# Patient Record
Sex: Male | Born: 1935 | Race: White | Hispanic: No | Marital: Married | Smoking: Former smoker
Health system: Southern US, Community
[De-identification: ages and names within clinical notes are randomized; demographics above are authoritative.]

## PROBLEM LIST (undated history)

## (undated) DIAGNOSIS — N400 Enlarged prostate without lower urinary tract symptoms: Secondary | ICD-10-CM

## (undated) DIAGNOSIS — I493 Ventricular premature depolarization: Secondary | ICD-10-CM

## (undated) DIAGNOSIS — I82409 Acute embolism and thrombosis of unspecified deep veins of unspecified lower extremity: Secondary | ICD-10-CM

## (undated) DIAGNOSIS — I10 Essential (primary) hypertension: Secondary | ICD-10-CM

## (undated) DIAGNOSIS — H269 Unspecified cataract: Secondary | ICD-10-CM

## (undated) DIAGNOSIS — I4891 Unspecified atrial fibrillation: Secondary | ICD-10-CM

## (undated) DIAGNOSIS — N183 Chronic kidney disease, stage 3 unspecified: Secondary | ICD-10-CM

## (undated) DIAGNOSIS — C419 Malignant neoplasm of bone and articular cartilage, unspecified: Secondary | ICD-10-CM

## (undated) DIAGNOSIS — M19049 Primary osteoarthritis, unspecified hand: Secondary | ICD-10-CM

## (undated) DIAGNOSIS — Z95828 Presence of other vascular implants and grafts: Secondary | ICD-10-CM

## (undated) DIAGNOSIS — I2699 Other pulmonary embolism without acute cor pulmonale: Secondary | ICD-10-CM

## (undated) DIAGNOSIS — I2510 Atherosclerotic heart disease of native coronary artery without angina pectoris: Secondary | ICD-10-CM

## (undated) DIAGNOSIS — E785 Hyperlipidemia, unspecified: Secondary | ICD-10-CM

## (undated) DIAGNOSIS — R42 Dizziness and giddiness: Secondary | ICD-10-CM

## (undated) HISTORY — DX: Presence of other vascular implants and grafts: Z95.828

## (undated) HISTORY — DX: Acute embolism and thrombosis of unspecified deep veins of unspecified lower extremity: I82.409

## (undated) HISTORY — DX: Hyperlipidemia, unspecified: E78.5

## (undated) HISTORY — DX: Atherosclerotic heart disease of native coronary artery without angina pectoris: I25.10

## (undated) HISTORY — DX: Malignant neoplasm of bone and articular cartilage, unspecified: C41.9

## (undated) HISTORY — DX: Unspecified cataract: H26.9

## (undated) HISTORY — PX: HIP SURGERY: SHX245

## (undated) HISTORY — DX: Other pulmonary embolism without acute cor pulmonale: I26.99

## (undated) HISTORY — PX: OTHER SURGICAL HISTORY: SHX169

## (undated) HISTORY — DX: Unspecified atrial fibrillation: I48.91

## (undated) HISTORY — DX: Primary osteoarthritis, unspecified hand: M19.049

## (undated) HISTORY — DX: Essential (primary) hypertension: I10

## (undated) HISTORY — DX: Chronic kidney disease, stage 3 unspecified: N18.30

## (undated) HISTORY — DX: Chronic kidney disease, stage 3 (moderate): N18.3

## (undated) HISTORY — DX: Benign prostatic hyperplasia without lower urinary tract symptoms: N40.0

## (undated) HISTORY — DX: Ventricular premature depolarization: I49.3

## (undated) HISTORY — DX: Dizziness and giddiness: R42

---

## 1970-10-11 HISTORY — PX: STOMACH SURGERY: SHX791

## 1983-10-12 HISTORY — PX: ROTATOR CUFF REPAIR: SHX139

## 1999-10-12 DIAGNOSIS — C419 Malignant neoplasm of bone and articular cartilage, unspecified: Secondary | ICD-10-CM

## 1999-10-12 HISTORY — DX: Malignant neoplasm of bone and articular cartilage, unspecified: C41.9

## 2002-10-22 HISTORY — PX: CARDIAC CATHETERIZATION: SHX172

## 2010-06-08 ENCOUNTER — Ambulatory Visit (HOSPITAL_COMMUNITY): Admission: RE | Admit: 2010-06-08 | Discharge: 2010-06-08 | Payer: Self-pay | Admitting: Ophthalmology

## 2010-09-14 ENCOUNTER — Ambulatory Visit: Payer: Self-pay | Admitting: Cardiology

## 2011-05-23 ENCOUNTER — Emergency Department (HOSPITAL_COMMUNITY): Payer: Medicare HMO

## 2011-05-23 ENCOUNTER — Inpatient Hospital Stay (HOSPITAL_COMMUNITY)
Admission: EM | Admit: 2011-05-23 | Discharge: 2011-05-25 | DRG: 176 | Disposition: A | Discharge status: Home / Self Care | Payer: Medicare HMO | Attending: Internal Medicine | Admitting: Internal Medicine

## 2011-05-23 DIAGNOSIS — I2510 Atherosclerotic heart disease of native coronary artery without angina pectoris: Secondary | ICD-10-CM | POA: Diagnosis present

## 2011-05-23 DIAGNOSIS — I10 Essential (primary) hypertension: Secondary | ICD-10-CM | POA: Diagnosis present

## 2011-05-23 DIAGNOSIS — I959 Hypotension, unspecified: Secondary | ICD-10-CM | POA: Diagnosis present

## 2011-05-23 DIAGNOSIS — N400 Enlarged prostate without lower urinary tract symptoms: Secondary | ICD-10-CM | POA: Diagnosis present

## 2011-05-23 DIAGNOSIS — I2699 Other pulmonary embolism without acute cor pulmonale: Principal | ICD-10-CM | POA: Diagnosis present

## 2011-05-23 DIAGNOSIS — E785 Hyperlipidemia, unspecified: Secondary | ICD-10-CM | POA: Diagnosis present

## 2011-05-23 DIAGNOSIS — Z7901 Long term (current) use of anticoagulants: Secondary | ICD-10-CM

## 2011-05-23 LAB — URINALYSIS, ROUTINE W REFLEX MICROSCOPIC
Bilirubin Urine: NEGATIVE
Glucose, UA: NEGATIVE mg/dL
Hgb urine dipstick: NEGATIVE
Ketones, ur: 15 mg/dL — AB
Leukocytes, UA: NEGATIVE
Nitrite: NEGATIVE
Protein, ur: NEGATIVE mg/dL
Specific Gravity, Urine: 1.019 (ref 1.005–1.030)
Urobilinogen, UA: 1 mg/dL (ref 0.0–1.0)
pH: 5.5 (ref 5.0–8.0)

## 2011-05-23 LAB — DIFFERENTIAL
Basophils Absolute: 0 K/uL (ref 0.0–0.1)
Basophils Relative: 0 % (ref 0–1)
Eosinophils Absolute: 0.1 10*3/uL (ref 0.0–0.7)
Eosinophils Relative: 4 % (ref 0–5)
Lymphocytes Relative: 40 % (ref 12–46)
Lymphs Abs: 1.3 K/uL (ref 0.7–4.0)
Monocytes Absolute: 0.4 10*3/uL (ref 0.1–1.0)
Monocytes Relative: 12 % (ref 3–12)
Neutro Abs: 1.4 K/uL — ABNORMAL LOW (ref 1.7–7.7)
Neutrophils Relative %: 45 % (ref 43–77)

## 2011-05-23 LAB — CBC
HCT: 34.4 % — ABNORMAL LOW (ref 39.0–52.0)
Hemoglobin: 11.8 g/dL — ABNORMAL LOW (ref 13.0–17.0)
MCH: 32.7 pg (ref 26.0–34.0)
MCHC: 34.3 g/dL (ref 30.0–36.0)
MCV: 95.3 fL (ref 78.0–100.0)
Platelets: 163 10*3/uL (ref 150–400)
RBC: 3.61 MIL/uL — ABNORMAL LOW (ref 4.22–5.81)
RDW: 14.6 % (ref 11.5–15.5)
WBC: 3.2 K/uL — ABNORMAL LOW (ref 4.0–10.5)

## 2011-05-23 LAB — APTT: aPTT: 34 seconds (ref 24–37)

## 2011-05-23 LAB — COMPREHENSIVE METABOLIC PANEL WITH GFR
ALT: 15 U/L (ref 0–53)
Albumin: 3.4 g/dL — ABNORMAL LOW (ref 3.5–5.2)
Alkaline Phosphatase: 66 U/L (ref 39–117)
Calcium: 9 mg/dL (ref 8.4–10.5)
GFR calc Af Amer: 31 mL/min — ABNORMAL LOW (ref >60)
Glucose, Bld: 90 mg/dL (ref 70–99)
Potassium: 5.4 meq/L — ABNORMAL HIGH (ref 3.5–5.1)
Sodium: 139 meq/L (ref 135–145)
Total Protein: 6.8 g/dL (ref 6.0–8.3)

## 2011-05-23 LAB — COMPREHENSIVE METABOLIC PANEL
AST: 21 U/L (ref 0–37)
BUN: 41 mg/dL — ABNORMAL HIGH (ref 6–23)
CO2: 21 mEq/L (ref 19–32)
Chloride: 109 mEq/L (ref 96–112)
Creatinine, Ser: 2.44 mg/dL — ABNORMAL HIGH (ref 0.50–1.35)
GFR calc non Af Amer: 25 mL/min — ABNORMAL LOW (ref >60)
Total Bilirubin: 0.3 mg/dL (ref 0.3–1.2)

## 2011-05-23 LAB — PROTIME-INR
INR: 2.04 — ABNORMAL HIGH (ref 0.00–1.49)
Prothrombin Time: 23.4 s — ABNORMAL HIGH (ref 11.6–15.2)

## 2011-05-23 LAB — POCT I-STAT TROPONIN I
Troponin i, poc: 0 ng/mL (ref 0.00–0.08)
Troponin i, poc: 0 ng/mL (ref 0.00–0.08)

## 2011-05-23 MED ORDER — TECHNETIUM TO 99M ALBUMIN AGGREGATED
6.0000 | Freq: Once | INTRAVENOUS | Status: AC | PRN
  Administered 2011-05-23: 6 via INTRAVENOUS

## 2011-05-23 MED ORDER — XENON XE 133 GAS
10.5000 | GAS_FOR_INHALATION | Freq: Once | RESPIRATORY_TRACT | Status: AC | PRN
  Administered 2011-05-23: 11 via RESPIRATORY_TRACT

## 2011-05-24 DIAGNOSIS — Z86718 Personal history of other venous thrombosis and embolism: Secondary | ICD-10-CM

## 2011-05-24 LAB — COMPREHENSIVE METABOLIC PANEL
BUN: 38 mg/dL — ABNORMAL HIGH (ref 6–23)
CO2: 21 mEq/L (ref 19–32)
Calcium: 8.7 mg/dL (ref 8.4–10.5)
Creatinine, Ser: 1.73 mg/dL — ABNORMAL HIGH (ref 0.50–1.35)
GFR calc Af Amer: 46 mL/min — ABNORMAL LOW (ref >60)
GFR calc non Af Amer: 38 mL/min — ABNORMAL LOW (ref >60)
Glucose, Bld: 89 mg/dL (ref 70–99)

## 2011-05-24 LAB — CBC
HCT: 32 % — ABNORMAL LOW (ref 39.0–52.0)
Hemoglobin: 10.6 g/dL — ABNORMAL LOW (ref 13.0–17.0)
MCH: 31.3 pg (ref 26.0–34.0)
MCV: 94.4 fL (ref 78.0–100.0)
RBC: 3.39 MIL/uL — ABNORMAL LOW (ref 4.22–5.81)

## 2011-05-24 LAB — DIFFERENTIAL
Eosinophils Absolute: 0.1 10*3/uL (ref 0.0–0.7)
Lymphocytes Relative: 47 % — ABNORMAL HIGH (ref 12–46)
Lymphs Abs: 1.5 10*3/uL (ref 0.7–4.0)
Monocytes Relative: 15 % — ABNORMAL HIGH (ref 3–12)
Neutro Abs: 1.1 10*3/uL — ABNORMAL LOW (ref 1.7–7.7)
Neutrophils Relative %: 34 % — ABNORMAL LOW (ref 43–77)

## 2011-05-24 LAB — HEPARIN LEVEL (UNFRACTIONATED): Heparin Unfractionated: 0.7 IU/mL (ref 0.30–0.70)

## 2011-05-24 NOTE — H&P (Signed)
NAMEJSAON, Darrell Mendoza                ACCOUNT NO.:  [id number]  MEDICAL RECORD NO.:  [id number]  LOCATION:                                 FACILITY:  PHYSICIAN:  Zakariah Urwin A. Presten Joost, M.D.   DATE OF BIRTH:  October 22, 1924  DATE OF ADMISSION: DATE OF DISCHARGE:                             HISTORY & PHYSICAL   CHIEF COMPLAINT:  Lightheadedness.  HISTORY OF PRESENT ILLNESS:  Darrell Mendoza is a pleasant 77 year old gentleman who has been doing fairly well.  He has been on chronic Coumadin for history of pulmonary embolism.  He also has a history of coronary artery disease.  He states that over the last week or so, he has felt somewhat more lightheaded.  He did have some travel to [place] in the last week also.  He was seen in [facility] and was told to increase his Coumadin dose.  His INR on May 18, 2011 was 1.9.  At that time, he was told to take 2.5 mg of Coumadin daily except 5 mg every Monday and Friday.  However, in church today, he had significant lightheaded feeling and broke out in a sweat.  Paramedics were called.  He was found to be slightly hypotensive with a systolic blood pressure of 88, diastolic blood pressure of 50.  He was brought to the emergency room. Emergency room evaluation eventually resulted in a V/Q scan which is high probability for pulmonary embolism.  So, it is possible that he has had recent recurrent pulmonary embolism.  He does give a possible history of having an inferior vena cava filter in place and this could be the reason that he is having this problem.  He denies any fevers, any significant chest pains, any blood from above or below.  PAST MEDICAL HISTORY: 1. Premature ventricular contractions. 2. Hand arthritis. 3. Coronary artery disease status post percutaneous intervention. 4. Chronic Coumadin therapy. 5. Past history of pulmonary embolism. 6. Hyperlipidemia. 7. Hypertension. 8. Osteosarcoma of the right hip, 2001. 9. Stomach surgery in 1972 for  a hernia, right rotator cuff surgery in     1985, lumbar surgery in 2000, right hip surgery in 2001-2002, and     bilateral cataracts.  ALLERGIES:  None.  MEDICATIONS: 1. Terazosin 5 mg p.o. daily at bedtime. 2. Pravastatin 40 mg p.o. daily at evening. 3. Lisinopril 2.5 mg 1 tablet daily. 4. Coumadin 2.5 mg daily except 5 mg every Monday and Friday.  SOCIAL HISTORY:  He is married.  He has 2 living children, but had 3 children in all.  He had high school education.  He is retired from a [employer] manager.  He is a nondrinker, nonsmoker.  No drug abuse.  FAMILY HISTORY:  Father died at age 1 of urosepsis.  Mother died at age 15 of tuberculosis.  One son died of cerebral palsy.  The other 2 children are alive and healthy.  REVIEW OF SYSTEMS:  As per the history of present illness.  PHYSICAL EXAMINATION:  VITAL SIGNS:  Temperature 98.3; initial blood pressure 97/64 and after some IV fluids, it came up to 149/64; pulse is 56, ranging from 56-91; respiratory rate 16; and 97% saturation on room  air. GENERAL:  He is sitting in semi-supine on the stretcher, in no acute distress.  He is alert and oriented x4 and speaks appropriately.  He is oriented to person, place, and time. LUNGS:  Clear to auscultation bilaterally anterolaterally with no wheezes, rales, or rhonchi. HEART:  Regular rate and rhythm with no significant murmur, rub, or gallop. ABDOMEN:  Soft, nontender, and nondistended.  There is no peripheral edema.  He moves extremities x4.  He has 2+ pulses in both ankles.  RADIOLOGY DATA:  V/Q scan shows high probability of pulmonary embolism with multiple wedge shape segmental and subsegmental defects in both lungs without corresponding ventilatory or chest x-ray abnormality. Chest x-ray done today shows bilateral basilar interstitial densities, which may represent interstitial pulmonary edema or more likely fibrotic change.  LABORATORY DATA:  Troponin I of 0.00.  Urinalysis is  negative with the exception of 15 mg/dL of ketones.  CMET shows a potassium of 5.4, BUN of 41, and creatinine 2.44.  Liver tests normal with the exception of an albumin of 3.4.  PTT is 34 and INR is 2.04.  White count 3.2, hemoglobin 11.8, and platelet count 163,000 with a normal differential.  Baseline BUN and creatinine from May 2012 were 30 and 1.5 respectively.  ASSESSMENT/PLAN:  An 77 year old gentleman with apparent recurrent pulmonary embolism.  He gives a history of possibly having an IVC filter in place.  This is unclear from our office record, but we will explore this further.  We will place him on full-dose heparin drip and continue his Coumadin with a goal INR of 3.0-3.5.  We will defer any other evaluation to his primary care physician.  However, we will order bilateral lower extremity venous Dopplers to rule out any acute or subacute DVT.  We will continue his home medications to include terazosin and pravastatin, but with slight elevation in his creatinine, we will hold lisinopril for the time being.  He has a full code status.     Erielle Gawronski A. Waynard Edwards, M.D.     MAP/MEDQ  D:  05/24/2011  T:  05/24/2011  Job:  [id number]  Electronically Signed by Rodrigo Ran M.D. on 05/24/2011 01:07:37 PM

## 2011-05-25 LAB — CBC
MCH: 30.9 pg (ref 26.0–34.0)
Platelets: 128 10*3/uL — ABNORMAL LOW (ref 150–400)
RBC: 3.69 MIL/uL — ABNORMAL LOW (ref 4.22–5.81)
RDW: 14.4 % (ref 11.5–15.5)
WBC: 3.2 10*3/uL — ABNORMAL LOW (ref 4.0–10.5)

## 2011-05-25 LAB — DIFFERENTIAL
Eosinophils Absolute: 0.2 10*3/uL (ref 0.0–0.7)
Lymphs Abs: 1.6 10*3/uL (ref 0.7–4.0)
Monocytes Absolute: 0.4 10*3/uL (ref 0.1–1.0)
Neutrophils Relative %: 31 % — ABNORMAL LOW (ref 43–77)

## 2011-05-25 LAB — COMPREHENSIVE METABOLIC PANEL
ALT: 12 U/L (ref 0–53)
Calcium: 9.2 mg/dL (ref 8.4–10.5)
Creatinine, Ser: 1.57 mg/dL — ABNORMAL HIGH (ref 0.50–1.35)
GFR calc Af Amer: 51 mL/min — ABNORMAL LOW (ref >60)
GFR calc non Af Amer: 42 mL/min — ABNORMAL LOW (ref >60)
Glucose, Bld: 97 mg/dL (ref 70–99)
Sodium: 139 mEq/L (ref 135–145)
Total Protein: 6.5 g/dL (ref 6.0–8.3)

## 2011-05-25 LAB — HEPARIN LEVEL (UNFRACTIONATED): Heparin Unfractionated: 0.7 IU/mL (ref 0.30–0.70)

## 2011-05-26 NOTE — Discharge Summary (Signed)
Darrell Mendoza, CIRELLI NO.:  [id number]  MEDICAL RECORD NO.:  [id number]  LOCATION:  3743                         FACILITY:  [facility]  PHYSICIAN:  Gaspar Garbe, M.D.DATE OF BIRTH:  17-Mar-1925  DATE OF ADMISSION:  05/23/2011 DATE OF DISCHARGE:  05/25/2011                              DISCHARGE SUMMARY   FINAL DIAGNOSES: 1. Pulmonary embolism. 2. Hypotension secondary to above. 3. Benign prostatic hypertrophy. 4. History of chronic lower extremity clots and Greenfield filter. 5. Coumadin anticoagulation for deep vein thrombosis. 6. Hyperlipidemia. 7. History of hypertension.  MEDICATIONS ON DISCHARGE: 1. Calcium over-the-counter 1 tablet daily. 2. Hytrin 2 mg 1 tablet by mouth daily. 3. Multivitamins over-the-counter once daily. 4. Pravastatin 40 mg once daily. 5. Vitamin B12 1 tablet once daily over-the-counter. 6. Vitamin C over-the-counter 1 tablet once daily. 7. Warfarin 5 mg daily.  He is to take half tablet on Thursday. 8. The patient is to discontinue his lisinopril which was previously     dosed at 2.5 mg daily.  IMAGING STUDIES:  The patient underwent a V/Q scan on date of admission which was May 23, 2011, showing high probability pulmonary embolism located with multiple white segments on both lungs without corresponding ventilatory or chest x-ray abnormality.  He had abdominal films done the early morning of May 24, 2011, for verification of his IVC filter which is placed between the levels of L1-L3 and nonobstructive bowel gas pattern was noted.  The patient's initial chest x-ray showed bibasilar interstitial densities representing interstitial pulmonary edema more likely fibrotic changes.  LABORATORY TESTS:  Day of admission, he had a CBC with a white count 3.2, hemoglobin 11.8, hematocrit 34.4, platelet count 163.  Discharge labs are currently pending for some reason and [facility] is delaying these on the morning of May 25, 2011.  His heparin levels have been adequate.  His electrolytes showed a potassium slightly elevated at 5.2 on May 24, 2011, for which he was receiving IV fluids and his final posted INR is 2.29.  PHYSICAL EXAMINATION:  On date of discharge: VITAL SIGNS:  134/67, satting 93% on room air, heart rate 68, respiratory rate 18. GENERAL:  No acute distress. HEENT:  Normocephalic, atraumatic.  PERRLA.  EOMI.  ENT is within normal limits. NECK:  Supple.  No lymphadenopathy, JVD or bruit. HEART:  Regular rate and rhythm.  No murmur, rub or gallop appreciated. LUNGS:  Clear to auscultation bilaterally. ABDOMEN:  Soft, nontender, normoactive bowel sounds. EXTREMITIES:  No clubbing, cyanosis or edema.  HOSPITAL COURSE:  Mr. Brigham was admitted through the emergency room on the evening of May 23, 2011.  He had felt somewhat weak and was found to be hypotensive after going to Urgent Care and was sent over to the emergency room for further evaluation.  This evaluation included a V/Q scan which showed a high probability of clot.  He was started on IV heparin and continued on his Coumadin.  Verification of his IVC filter was noted, this was absent from his office records though it was noted on radiology exam.  As noted above, the patient remained stable for the next 48 hours.  He subsequently had  ultrasound of his lower extremities which did not show any evidence of DVT.  He had no clinical signs of any upper extremity DVT or source and continued to do well during these 48 hours without evidence of hypotension or hypoxia.  The patient is being discharged home with goal of INR towards 3-3.5 versus his prior 2-3. His last office INR was 1.9 as initial INR in the emergency room was 2.0 showing that he was in fact anticoagulated at the time that his pulmonary embolus occurred. The patient continued to do well and will follow up in the office for Coumadin check on Friday which is May 28, 2011,  at 9:10 a.m. with our nurse practitioner, Fannie Knee who will make adjustments with a goal INR of 3.0 to continue with routine followup.     Gaspar Garbe, M.D.     RWT/MEDQ  D:  05/25/2011  T:  05/25/2011  Job:  [id number]  Electronically Signed by Guerry Bruin M.D. on 05/26/2011 07:45:04 PM

## 2011-09-17 ENCOUNTER — Ambulatory Visit: Payer: Medicare HMO | Admitting: Cardiology

## 2011-10-26 ENCOUNTER — Encounter: Payer: Self-pay | Admitting: Cardiology

## 2011-10-26 ENCOUNTER — Ambulatory Visit (INDEPENDENT_AMBULATORY_CARE_PROVIDER_SITE_OTHER): Payer: Medicare Other | Admitting: Cardiology

## 2011-10-26 VITALS — BP 116/66 | HR 56 | Ht 68.0 in | Wt 180.0 lb

## 2011-10-26 DIAGNOSIS — O2230 Deep phlebothrombosis in pregnancy, unspecified trimester: Secondary | ICD-10-CM

## 2011-10-26 DIAGNOSIS — I2510 Atherosclerotic heart disease of native coronary artery without angina pectoris: Secondary | ICD-10-CM

## 2011-10-26 DIAGNOSIS — E785 Hyperlipidemia, unspecified: Secondary | ICD-10-CM

## 2011-10-26 DIAGNOSIS — I2699 Other pulmonary embolism without acute cor pulmonale: Secondary | ICD-10-CM | POA: Insufficient documentation

## 2011-10-26 DIAGNOSIS — I82409 Acute embolism and thrombosis of unspecified deep veins of unspecified lower extremity: Secondary | ICD-10-CM

## 2011-10-26 DIAGNOSIS — I10 Essential (primary) hypertension: Secondary | ICD-10-CM

## 2011-10-26 NOTE — Assessment & Plan Note (Signed)
He is asymptomatic at this point and is on chronic anticoagulation with Coumadin.

## 2011-10-26 NOTE — Progress Notes (Signed)
Darrell Mendoza Date of Birth: [birth year] Medical Record #[id number]  History of Present Illness: Darrell Mendoza is seen for yearly followup. He has a history of coronary disease and underwent stenting of the mid right coronary in 2004 with a 3.0 x 23 mm Cypher stent. He reports he has done very well from a cardiac standpoint over the past year. His last stress test was in December of 2010 showed a basal to mid inferior wall scar without ischemia and ejection fraction 61%. His medical history is significant for a DVT with pulmonary emboli in August of this year. This was felt to be related to prolonged travel. He has been on Coumadin therapy since then. During that hospital stay he was noted to have frequent PVCs.  Current Outpatient Prescriptions on File Prior to Visit  Medication Sig Dispense Refill  . vitamin D, CHOLECALCIFEROL, 400 UNITS tablet Take 400 Units by mouth daily.      Marland Kitchen warfarin (COUMADIN) 5 MG tablet Take 5 mg by mouth daily.        No Known Allergies  Past Medical History  Diagnosis Date  . Pulmonary embolism     05/2011  . Hypotension   . Benign prostatic hypertrophy   . Greenfield filter in place     History of chronic lower extremity clots  . Deep vein thrombosis     Coumadin anticoagulation for deep vein thrombosis  . Hyperlipidemia   . Hypertension      History of hypertension  . Lightheadedness   . Premature ventricular contractions   . Hand arthritis   . Coronary artery disease     s/p 3.0x23 cypher mid rca 2004  . 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 Bilateral cataracts     Past Surgical History  Procedure Date  . Stomach surgery 1972    for a hernia  . Rotator cuff repair 1985    right rotator cuff surgery  . Hip surgery 2001-2001    right hip surgery  . Cardiac catheterization 10/22/2002    left heart cath    History  Smoking status  . Former Smoker  Smokeless tobacco  . Former Neurosurgeon  . Quit date: 10/20/1983    History  Alcohol Use  No    Family History  Problem Relation Age of Onset  . Tuberculosis Mother     Review of Systems: The review of systems is positive for some chronic renal insufficiency.  All other systems were reviewed and are negative.  Physical Exam: BP 116/66  Pulse 56  Ht 5\' 8"  (1.727 m)  Wt 180 lb (81.647 kg)  BMI 27.37 kg/m2 He is a pleasant white male in no acute distress.The patient is alert and oriented x 3.  The mood and affect are normal.  The skin is warm and dry.  Color is normal.  The HEENT exam reveals that the sclera are nonicteric.  The mucous membranes are moist.  The carotids are 2+ without bruits.  There is no thyromegaly.  There is no JVD.  The lungs are clear.  The chest wall is non tender.  The heart exam reveals a regular rate with occasional extrasystole and a normal S1 and S2.  There are no murmurs, gallops, or rubs.  The PMI is not displaced.   Abdominal exam reveals good bowel sounds.  There is no guarding or rebound.  There is no hepatosplenomegaly or tenderness.  There are no masses.  Exam of the legs reveal no  clubbing, cyanosis, or edema.  The legs are without rashes.  The distal pulses are intact.  Cranial nerves II - XII are intact.  Motor and sensory functions are intact.  The gait is normal.  LABORATORY DATA:   Assessment / Plan:

## 2011-10-26 NOTE — Patient Instructions (Signed)
I would recommend you take a baby ASA 81 mg daily in addition to your coumadin.  Continue your other therapy.  I will see you again in 1 year.

## 2011-10-26 NOTE — Assessment & Plan Note (Addendum)
He has had remote drug-eluting stent placement in the right coronary. Although he is on Coumadin for his pulmonary embolus I think he also needs to stay on aspirin 81 mg daily because of his coronary disease. I will followup again in one year.

## 2011-10-26 NOTE — Assessment & Plan Note (Signed)
Blood pressure is controlled on exam today.

## 2012-11-13 ENCOUNTER — Encounter: Payer: Self-pay | Admitting: Cardiology

## 2012-11-13 ENCOUNTER — Ambulatory Visit (INDEPENDENT_AMBULATORY_CARE_PROVIDER_SITE_OTHER): Payer: Medicare Other | Admitting: Cardiology

## 2012-11-13 VITALS — BP 128/66 | HR 76 | Ht 68.0 in | Wt 179.0 lb

## 2012-11-13 DIAGNOSIS — I2699 Other pulmonary embolism without acute cor pulmonale: Secondary | ICD-10-CM

## 2012-11-13 DIAGNOSIS — I2510 Atherosclerotic heart disease of native coronary artery without angina pectoris: Secondary | ICD-10-CM

## 2012-11-13 DIAGNOSIS — I82409 Acute embolism and thrombosis of unspecified deep veins of unspecified lower extremity: Secondary | ICD-10-CM

## 2012-11-13 DIAGNOSIS — I10 Essential (primary) hypertension: Secondary | ICD-10-CM

## 2012-11-13 DIAGNOSIS — E785 Hyperlipidemia, unspecified: Secondary | ICD-10-CM

## 2012-11-13 NOTE — Progress Notes (Signed)
[name] Date of Birth: 1925-06-13 Medical Record #[id number]  History of Present Illness: [name] is seen for yearly followup. He has a history of coronary disease and underwent stenting of the mid right coronary in 2004 with a 3.0 x 23 mm Cypher stent.  His last stress test was in December of 2010 showed a basal to mid inferior wall scar without ischemia and ejection fraction 61%. His medical history is significant for a DVT with pulmonary emboli. He is on chronic Coumadin therapy. He states he has done very well this year. The only pain he has had is a sharp pain beneath his left breast when he is sitting. This is positionally related and is worse when he slumps to his left side. He denies any exertional chest pain. His walking is limited because of chronic hip pain.  Current Outpatient Prescriptions on File Prior to Visit  Medication Sig Dispense Refill  . calcium-vitamin D (OSCAL WITH D) 250-125 MG-UNIT per tablet Take 1 tablet by mouth daily.      . fish oil-omega-3 fatty acids 1000 MG capsule Take 2 g by mouth daily.      . Multiple Vitamin (MULTIVITAMIN) tablet Take 1 tablet by mouth daily.      . pravastatin (PRAVACHOL) 40 MG tablet Take 40 mg by mouth daily.      . Tamsulosin HCl (FLOMAX) 0.4 MG CAPS Take 0.4 mg by mouth daily.      . vitamin D, CHOLECALCIFEROL, 400 UNITS tablet Take 400 Units by mouth daily.      Marland Kitchen warfarin (COUMADIN) 5 MG tablet Take 5 mg by mouth daily.        No Known Allergies  Past Medical History  Diagnosis Date  . Pulmonary embolism     05/2011  . Hypotension   . Benign prostatic hypertrophy   . Greenfield filter in place     History of chronic lower extremity clots  . Deep vein thrombosis     Coumadin anticoagulation for deep vein thrombosis  . Hyperlipidemia   . Hypertension      History of hypertension  . Lightheadedness   . Premature ventricular contractions   . Hand arthritis   . Coronary artery disease     s/p 3.0x23 cypher mid  rca 2004  . 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 Bilateral cataracts     Past Surgical History  Procedure Date  . Stomach surgery 1972    for a hernia  . Rotator cuff repair 1985    right rotator cuff surgery  . Hip surgery 2001-2001    right hip surgery  . Cardiac catheterization 10/22/2002    left heart cath    History  Smoking status  . Former Smoker  Smokeless tobacco  . Former Neurosurgeon  . Quit date: 10/20/1983    History  Alcohol Use No    Family History  Problem Relation Age of Onset  . Tuberculosis Mother     Review of Systems: As noted per history of present illness.  All other systems were reviewed and are negative.  Physical Exam: BP 128/66  Pulse 76  Ht 5\' 8"  (1.727 m)  Wt 179 lb (81.194 kg)  BMI 27.22 kg/m2 He is a pleasant white male in no acute distress.The patient is alert and oriented x 3.  The mood and affect are normal.  The skin is warm and dry.  Color is normal.  The HEENT exam is normal. The carotids  are 2+ without bruits.  There is no thyromegaly.  There is no JVD.  The lungs are clear.  The chest wall is non tender.  The heart exam reveals a regular rate with occasional extrasystole and a normal S1 and S2.  There are no murmurs, gallops, or rubs.  The PMI is not displaced.   Abdominal exam reveals good bowel sounds.  There is no guarding or rebound.  There is no hepatosplenomegaly or tenderness.  There are no masses.  Exam of the legs reveal no clubbing, cyanosis, or edema.  The legs are without rashes.  The distal pulses are intact.  Cranial nerves II - XII are intact.  Motor and sensory functions are intact.  The gait is normal.  LABORATORY DATA: ECG today demonstrates normal sinus rhythm with frequent PACs and PVCs. There are nonspecific ST changes. This is unchanged from one year prior.  Assessment / Plan: 1. Coronary disease status post stenting of the right coronary in 2004 with a Cypher stent. He is asymptomatic. I will recommend  continuing on his current medication.  2. History of DVT with pulmonary emboli. On chronic anticoagulation. Next  3. Hypertension. Blood pressure is well controlled.  4. Atypical chest pain. His symptoms are musculoskeletal in origin.

## 2012-11-13 NOTE — Patient Instructions (Signed)
Continue your current therapy  I will see you again in one year.   

## 2013-02-22 ENCOUNTER — Encounter: Payer: Self-pay | Admitting: Cardiology

## 2013-09-10 ENCOUNTER — Telehealth: Payer: Self-pay | Admitting: *Deleted

## 2013-09-10 NOTE — Telephone Encounter (Signed)
Pt's caregiver states pt has an appt 09/12/2013 at 130pm, but has extremely red, swollen toe.  Could he get an earlier appt?  I contacted pt's wife and instructed to perform warm epsom salt water soaks and apply antibacterial ointment to toe until the appt, and transferred to scheduler.

## 2013-09-11 ENCOUNTER — Ambulatory Visit (INDEPENDENT_AMBULATORY_CARE_PROVIDER_SITE_OTHER): Payer: Medicare Other

## 2013-09-11 VITALS — BP 119/79 | HR 68 | Resp 20 | Ht 69.0 in | Wt 169.0 lb

## 2013-09-11 DIAGNOSIS — L600 Ingrowing nail: Secondary | ICD-10-CM

## 2013-09-11 DIAGNOSIS — M79609 Pain in unspecified limb: Secondary | ICD-10-CM

## 2013-09-11 DIAGNOSIS — L03039 Cellulitis of unspecified toe: Secondary | ICD-10-CM

## 2013-09-11 DIAGNOSIS — B351 Tinea unguium: Secondary | ICD-10-CM

## 2013-09-11 MED ORDER — CEPHALEXIN 500 MG PO CAPS: 500.0000 mg | ORAL_CAPSULE | Freq: Three times a day (TID) | ORAL | Status: DC

## 2013-09-11 NOTE — Progress Notes (Signed)
   Subjective:    Patient ID: Darrell Mendoza, male    DOB: [birth year], 79 y.o.   MRN: [id number]  "I think I have an ingrown toenail.  Some pus has come out of it."  Toe Pain  Incident onset: 1 week. There was no injury mechanism. Pain location: Right Hallux. Quality: Sore, Red, Swelling. The pain is at a severity of 7/10. The pain is moderate. The pain has been constant since onset. He reports no foreign bodies present. Treatments tried: soaking in [employer], Neosporin. The treatment provided no relief.      Review of Systems  Genitourinary: Positive for frequency.  Musculoskeletal:       Hip Pain Rt.  Hematological: Bruises/bleeds easily.  All other systems reviewed and are negative.       Objective:   Physical Exam Vascular status is diminished with pedal pulses palpable with thready DP and PT plus one over 4 bilateral. Capillary fill time 3-4 seconds all digits temperature warm to cool turgor diminished. Neurologically epicritic and proprioceptive sensations intact and symmetric bilateral there is normal plantar response DTRs not elicited neurologically skin color pigment normal there is keratosis incurvation friability the nails 1 through 5 bilateral are tender painful and symptomatic and debridement and on palpation with enclosed shoe wear. There is also keratoses sub-5 right which is painful and symptomatic. However the lateral border the right hallux was edema erythema pain tenderness with sign of discharge drainage long lateral border. Patient has a history of hip replacement on the right side is at risk for infection complication at this time recommendation for partial nail excision as well as debridement of painful mycotic nails.       Assessment & Plan:  Assessment this time ingrowing nail with turning to right hallux lateral border. Also onychomycosis orthotic criptotic nails 1 through 5 bilateral debridement at this time. Still keratotic lesion sub-5 right is also  debridement this time. Local anesthetic block is administered to the right great toe of 3 cc 50-50 mixture percent Xylocaine plain and 0.5% Marcaine plain the lateral border is excised phenol matricectomy x3 applications followed by alcohol wash was performed. Silvadene cream and gauze dressing was applied instructions for daily soap was dispensed recommended Tylenol as needed for pain also prescription for cephalexin called in to pharmacy. Reappointed 2 weeks for nail check. Also this time the remaining nails thick friable brittle are debrided and the presence of onychomycosis painful mycotic nails x9 also keratotic lesion sub-5 right is debrided recheck in 2 weeks  Alvan Dame DPM

## 2013-09-11 NOTE — Patient Instructions (Signed)

## 2013-09-12 ENCOUNTER — Ambulatory Visit: Payer: Self-pay | Admitting: Podiatrist

## 2013-09-25 ENCOUNTER — Ambulatory Visit (INDEPENDENT_AMBULATORY_CARE_PROVIDER_SITE_OTHER): Payer: Medicare Other

## 2013-09-25 VITALS — BP 116/70 | HR 60 | Resp 20 | Ht 70.0 in | Wt 170.0 lb

## 2013-09-25 DIAGNOSIS — Z09 Encounter for follow-up examination after completed treatment for conditions other than malignant neoplasm: Secondary | ICD-10-CM

## 2013-09-25 DIAGNOSIS — L600 Ingrowing nail: Secondary | ICD-10-CM

## 2013-09-25 NOTE — Patient Instructions (Signed)

## 2013-09-25 NOTE — Progress Notes (Signed)
   Subjective:    Patient ID: Darrell Mendoza, male    DOB: 12-23-1924, 79 y.o.   MRN: [id number]  "It's good.  The soreness is almost gone."  HPI    Review of Systems no changes or new find    Objective:   Physical Exam Neurovascular status is intact the hallux has a slight eschar tissue lateral border right hallux slight erythema noted no excessive pain no. Discharge or drainage noted. At this time. Cleanse with peroxide Neosporin and Band-Aid dressing applied maintain Band-Aid dressing daily or try at night       Assessment & Plan:  Assessment good postop progress no signs of infection he's peas to be healing well patient appears to be pleased advised to keep Neosporin and Band-Aid on during the day to be healed completely with the next 2-4 weeks if not we'll followup on an as-needed basis. Patient is doing well status post phenol matricectomy lateral border right great toe.  Alvan Dame DPM

## 2013-11-22 ENCOUNTER — Ambulatory Visit (INDEPENDENT_AMBULATORY_CARE_PROVIDER_SITE_OTHER): Payer: Medicare Other | Admitting: Cardiology

## 2013-11-22 ENCOUNTER — Encounter: Payer: Self-pay | Admitting: Cardiology

## 2013-11-22 VITALS — BP 120/54 | HR 60 | Ht 70.0 in | Wt 174.0 lb

## 2013-11-22 DIAGNOSIS — I2510 Atherosclerotic heart disease of native coronary artery without angina pectoris: Secondary | ICD-10-CM

## 2013-11-22 DIAGNOSIS — E785 Hyperlipidemia, unspecified: Secondary | ICD-10-CM

## 2013-11-22 DIAGNOSIS — I10 Essential (primary) hypertension: Secondary | ICD-10-CM

## 2013-11-22 NOTE — Patient Instructions (Signed)
Continue your current therapy  Get aerobic exercise-- walk evert day!!!  I will see you in one year.

## 2013-11-22 NOTE — Progress Notes (Signed)
Metta Clines Date of Birth: 23-Jun-1925 Medical Record #[id number]  History of Present Illness: [name] is seen for yearly followup. He has a history of coronary disease and underwent stenting of the mid right coronary in 2004 with a 3.0 x 23 mm Cypher stent.  His last stress test was in December of 2010 showed a basal to mid inferior wall scar without ischemia and ejection fraction 61%. His medical history is significant for a DVT with pulmonary emboli. He is on chronic Coumadin therapy. He states he has done very well this year. He denies any chest pain or SOB. He is quite sedentary.   Current Outpatient Prescriptions on File Prior to Visit  Medication Sig Dispense Refill  . calcium-vitamin D (OSCAL WITH D) 250-125 MG-UNIT per tablet Take 1 tablet by mouth daily.      . fish oil-omega-3 fatty acids 1000 MG capsule Take 2 g by mouth daily.      . Multiple Vitamin (MULTIVITAMIN) tablet Take 1 tablet by mouth daily.      . pravastatin (PRAVACHOL) 40 MG tablet Take 40 mg by mouth daily.      . Tamsulosin HCl (FLOMAX) 0.4 MG CAPS Take 0.4 mg by mouth daily.      . vitamin D, CHOLECALCIFEROL, 400 UNITS tablet Take 400 Units by mouth daily.      Marland Kitchen warfarin (COUMADIN) 5 MG tablet Take 5 mg by mouth daily.       No current facility-administered medications on file prior to visit.    No Known Allergies  Past Medical History  Diagnosis Date  . Pulmonary embolism     05/2011  . Hypotension   . Benign prostatic hypertrophy   . Greenfield filter in place     History of chronic lower extremity clots  . Deep vein thrombosis     Coumadin anticoagulation for deep vein thrombosis  . Hyperlipidemia   . Hypertension      History of hypertension  . Lightheadedness   . Premature ventricular contractions   . Hand arthritis   . Coronary artery disease     s/p 3.0x23 cypher mid rca 2004  . 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 Bilateral cataracts     Past Surgical History  Procedure  Laterality Date  . Stomach surgery  1972    for a hernia  . Rotator cuff repair  1985    right rotator cuff surgery  . Hip surgery  2001-2001    right hip surgery  . Cardiac catheterization  10/22/2002    left heart cath  . Excison toenail Right     HALLUX    History  Smoking status  . Former Smoker  Smokeless tobacco  . Former Systems developer  . Quit date: 10/20/1983    History  Alcohol Use  . Yes    Comment: very little    Family History  Problem Relation Age of Onset  . Tuberculosis Mother     Review of Systems: As noted per history of present illness.  All other systems were reviewed and are negative.  Physical Exam: BP 120/54  Pulse 60  Ht 5\' 10"  (1.778 m)  Wt 174 lb (78.926 kg)  BMI 24.97 kg/m2 He is a pleasant elderly white male in no acute distress. The skin is warm and dry.   The HEENT exam is normal. The carotids are 2+ without bruits.  There is no thyromegaly.  There is no JVD.  The lungs are  clear.    The heart exam reveals a regular rate and rhythm and a normal S1 and S2.  There are no murmurs, gallops, or rubs.  The PMI is not displaced.   Abdominal exam reveals good bowel sounds.  There is no hepatosplenomegaly or tenderness.  There are no masses.  Exam of the legs reveal no clubbing, cyanosis, or edema.   The distal pulses are intact.  Cranial nerves II - XII are intact.  Motor and sensory functions are intact.  The gait is normal.  LABORATORY DATA: ECG today demonstrates normal sinus rhythm with first degree AV block. There are nonspecific ST-T changes. This is unchanged from one year prior.  Assessment / Plan: 1. Coronary disease status post stenting of the right coronary in 2004 with a Cypher stent. He is asymptomatic. I will recommend continuing on his current medication.  2. History of DVT with pulmonary emboli. On chronic anticoagulation with coumadin.  3. Hypertension. Blood pressure is well controlled.  I have recommended increasing his aerobic  activity with daily walking. I will see him in one year.

## 2013-12-27 ENCOUNTER — Encounter: Payer: Self-pay | Admitting: Podiatrist

## 2013-12-27 ENCOUNTER — Ambulatory Visit (INDEPENDENT_AMBULATORY_CARE_PROVIDER_SITE_OTHER): Payer: Medicare Other | Admitting: Podiatrist

## 2013-12-27 VITALS — BP 142/70 | HR 64 | Resp 12

## 2013-12-27 DIAGNOSIS — B351 Tinea unguium: Secondary | ICD-10-CM

## 2013-12-27 DIAGNOSIS — M79609 Pain in unspecified limb: Secondary | ICD-10-CM

## 2013-12-27 NOTE — Progress Notes (Signed)
  HPI:  Patient presents today for follow up of foot and nail care. Denies any new complaints today. States he saw dr. Blenda Mounts who had to remove part of the right great toenail due to it being ingrown and infected.  States it has healed well and he has had no further problems out of the toe.   Objective:  Patients chart is reviewed.  Vascular status reveals pedal pulses noted at 1 out of 4 dp and pt bilateral .  Neurological sensation is Normal to [employer] monofilament bilateral.  Patients nails are thickened, discolored, distrophic, friable and brittle with yellow-brown discoloration. Patient subjectively relates they are painful with shoes and with ambulation of bilateral feet. Right great toenail lateral border has healed well.  No sign of irritation or infection is present.  Digital contractures continue to be present which is per baseline.  Hyperkeratotic lesion present submet 5 right  Assessment:  Symptomatic onychomycosis  Plan:  Discussed treatment options and alternatives.  The symptomatic toenails were debrided through manual an mechanical means without complication.  Return appointment recommended at routine intervals of 3 months    Trudie Buckler, DPM

## 2014-03-28 ENCOUNTER — Ambulatory Visit (INDEPENDENT_AMBULATORY_CARE_PROVIDER_SITE_OTHER): Payer: Medicare Other | Admitting: Podiatry

## 2014-03-28 ENCOUNTER — Encounter: Payer: Self-pay | Admitting: Podiatry

## 2014-03-28 DIAGNOSIS — M79609 Pain in unspecified limb: Secondary | ICD-10-CM

## 2014-03-28 DIAGNOSIS — M79673 Pain in unspecified foot: Secondary | ICD-10-CM

## 2014-03-28 DIAGNOSIS — B351 Tinea unguium: Secondary | ICD-10-CM

## 2014-03-28 NOTE — Progress Notes (Signed)
Subjective:     Patient ID: Darrell Mendoza, male   DOB: Apr 13, 1925, 80 y.o.   MRN: [id number]  HPI patient presents with thick painful nailbeds that he cannot cut 1-5 both feet and keratotic lesion sub-fifth metatarsal right foot   Review of Systems     Objective:   Physical Exam Neurovascular status unchanged with thick yellow brittle nailbeds 1-5 both feet and keratotic lesion fifth metatarsal of the right foot    Assessment:     Mycotic nail infection with pain and lesion plantar right it's painful    Plan:     Debris painful nailbeds 1-5 both feet with no bleeding noted and debridement lesion plantar aspect right foot

## 2014-07-04 ENCOUNTER — Ambulatory Visit (INDEPENDENT_AMBULATORY_CARE_PROVIDER_SITE_OTHER): Payer: Medicare Other | Admitting: Podiatry

## 2014-07-04 DIAGNOSIS — L84 Corns and callosities: Secondary | ICD-10-CM

## 2014-07-04 DIAGNOSIS — M216X9 Other acquired deformities of unspecified foot: Secondary | ICD-10-CM

## 2014-07-04 DIAGNOSIS — B351 Tinea unguium: Secondary | ICD-10-CM

## 2014-07-04 DIAGNOSIS — M79609 Pain in unspecified limb: Secondary | ICD-10-CM

## 2014-07-04 DIAGNOSIS — M79673 Pain in unspecified foot: Secondary | ICD-10-CM

## 2014-07-04 NOTE — Progress Notes (Signed)
   Subjective:    Patient ID: Darrell Mendoza, male    DOB: 02/04/1925, 80 y.o.   MRN: [id number]  HPI  Pt presents for nail debridement  Review of Systems     Objective:   Physical Exam        Assessment & Plan:

## 2014-07-05 NOTE — Progress Notes (Signed)
Subjective:     Patient ID: Darrell Mendoza, male   DOB: 1925/03/23, 80 y.o.   MRN: [id number]  HPI patient presents with the long gaited nailbeds 1-5 both feet that are thick and painful for the patient and he cannot cut   Review of Systems     Objective:   Physical Exam Of vascular status intact with thick yellow brittle nailbeds 1-5 both feet    Assessment:     I can't nail infection with pain 1-5 both feet    Plan:     Debride painful nailbeds 1-5 both feet with no iatrogenic bleeding noted

## 2014-09-27 ENCOUNTER — Ambulatory Visit (INDEPENDENT_AMBULATORY_CARE_PROVIDER_SITE_OTHER): Payer: Medicare Other | Admitting: Podiatrist

## 2014-09-27 ENCOUNTER — Encounter: Payer: Self-pay | Admitting: Podiatrist

## 2014-09-27 DIAGNOSIS — M79676 Pain in unspecified toe(s): Secondary | ICD-10-CM

## 2014-09-27 DIAGNOSIS — B351 Tinea unguium: Secondary | ICD-10-CM

## 2014-09-30 NOTE — Progress Notes (Signed)
  HPI:  Patient presents today for follow up of foot and nail care. Denies any new complaints today. States he saw dr. Blenda Mounts who had to remove part of the right great toenail due to it being ingrown and infected.  States it has healed well and he has had no further problems out of the toe.   Objective:  Patients chart is reviewed.  Vascular status reveals pedal pulses noted at 1 out of 4 dp and pt bilateral .  Neurological sensation is Normal to [employer] monofilament bilateral.  Patients nails are thickened, discolored, distrophic, friable and brittle with yellow-brown discoloration. Patient subjectively relates they are painful with shoes and with ambulation of bilateral feet. Right great toenail lateral border has healed well.  No sign of irritation or infection is present.  Digital contractures continue to be present which is per baseline.  Hyperkeratotic lesion present submet 5 right  Assessment:  Symptomatic onychomycosis  Plan:  Discussed treatment options and alternatives.  The symptomatic toenails were debrided through manual an mechanical means without complication.  Return appointment recommended at routine intervals of 3 months    Trudie Buckler, DPM

## 2014-10-03 ENCOUNTER — Ambulatory Visit: Payer: Medicare Other | Admitting: Podiatrist

## 2014-12-03 ENCOUNTER — Encounter: Payer: Self-pay | Admitting: Cardiology

## 2014-12-03 ENCOUNTER — Ambulatory Visit (INDEPENDENT_AMBULATORY_CARE_PROVIDER_SITE_OTHER): Payer: Medicare Other | Admitting: Cardiology

## 2014-12-03 VITALS — BP 120/68 | HR 67 | Ht 68.0 in | Wt 180.4 lb

## 2014-12-03 DIAGNOSIS — I10 Essential (primary) hypertension: Secondary | ICD-10-CM

## 2014-12-03 DIAGNOSIS — I82409 Acute embolism and thrombosis of unspecified deep veins of unspecified lower extremity: Secondary | ICD-10-CM

## 2014-12-03 DIAGNOSIS — I25118 Atherosclerotic heart disease of native coronary artery with other forms of angina pectoris: Secondary | ICD-10-CM

## 2014-12-03 NOTE — Patient Instructions (Signed)
Continue your current therapy  I will plan on seeing you in one year.

## 2014-12-03 NOTE — Progress Notes (Signed)
Darrell Mendoza Date of Birth: Dec 20, 1924 Medical Record #[id number]  History of Present Illness: Darrell Mendoza is seen for follow up of CAD. He has a history of coronary disease and underwent stenting of the mid right coronary in 2004 with a 3.0 x 23 mm Cypher stent.  His last stress test was in December of 2010 showed a basal to mid inferior wall scar without ischemia and ejection fraction 61%. His medical history is significant for a DVT with pulmonary emboli. He is on chronic Coumadin therapy. He states he has done very well this year. He did have a fall about a month ago without injury. He does get lightheaded when he bends over. Last week he awoke with left arm pain that lasted about 20 minutes. Thinks he was lying on his arm. No chest pain or SOB. He is still sedentary.   Current Outpatient Prescriptions on File Prior to Visit  Medication Sig Dispense Refill  . calcium-vitamin D (OSCAL WITH D) 250-125 MG-UNIT per tablet Take 1 tablet by mouth daily.    . fish oil-omega-3 fatty acids 1000 MG capsule Take 2 g by mouth daily.    . Multiple Vitamin (MULTIVITAMIN) tablet Take 1 tablet by mouth daily.    . pravastatin (PRAVACHOL) 40 MG tablet Take 40 mg by mouth daily.    . Tamsulosin HCl (FLOMAX) 0.4 MG CAPS Take 0.4 mg by mouth daily.    . vitamin D, CHOLECALCIFEROL, 400 UNITS tablet Take 400 Units by mouth daily.    Marland Kitchen warfarin (COUMADIN) 5 MG tablet Take 5 mg by mouth daily.     No current facility-administered medications on file prior to visit.    No Known Allergies  Past Medical History  Diagnosis Date  . Pulmonary embolism     05/2011  . Hypotension   . Benign prostatic hypertrophy   . Greenfield filter in place     History of chronic lower extremity clots  . Deep vein thrombosis     Coumadin anticoagulation for deep vein thrombosis  . Hyperlipidemia   . Hypertension      History of hypertension  . Lightheadedness   . Premature ventricular contractions   . Hand arthritis    . Coronary artery disease     s/p 3.0x23 cypher mid rca 2004  . 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 Bilateral cataracts     Past Surgical History  Procedure Laterality Date  . Stomach surgery  1972    for a hernia  . Rotator cuff repair  1985    right rotator cuff surgery  . Hip surgery  2001-2001    right hip surgery  . Cardiac catheterization  10/22/2002    left heart cath  . Excison toenail Right     HALLUX    History  Smoking status  . Former Smoker  Smokeless tobacco  . Former Systems developer  . Quit date: 10/20/1983    History  Alcohol Use  . Yes    Comment: very little    Family History  Problem Relation Age of Onset  . Tuberculosis Mother     Review of Systems: As noted per history of present illness.  All other systems were reviewed and are negative.  Physical Exam: BP 120/68 mmHg  Pulse 67  Ht 5\' 8"  (1.727 m)  Wt 180 lb 6 oz (81.818 kg)  BMI 27.43 kg/m2 He is a pleasant elderly white male in no acute distress. The skin is warm and  dry.   The HEENT exam is normal. The carotids are 2+ without bruits.  There is no thyromegaly.  There is no JVD.  The lungs are clear.    The heart exam reveals a regular rate and rhythm and a normal S1 and S2.  There are no murmurs, gallops, or rubs.  The PMI is not displaced.   Abdominal exam reveals good bowel sounds.  There is no hepatosplenomegaly or tenderness.  There are no masses.  Exam of the legs reveal no clubbing, cyanosis, or edema.   The distal pulses are intact.  Cranial nerves II - XII are intact.  Motor and sensory functions are intact.  The gait is normal.  LABORATORY DATA: ECG today demonstrates normal sinus rhythm with frequent PVCs in a pattern of bigeminy.  There are nonspecific ST-T changes. I have personally reviewed and interpreted this study.   Assessment / Plan: 1. Coronary disease status post stenting of the right coronary in 2004 with a Cypher stent. He is asymptomatic. I think his arm pain is  probably musculoskeletal.  I will recommend continuing on his current medication.  2. History of DVT with pulmonary emboli. On chronic anticoagulation with coumadin.  3. Hypertension. Blood pressure is well controlled.  4. Chronic PVCs. Not symptomatic.   I will follow up in one year.

## 2014-12-27 ENCOUNTER — Encounter: Payer: Self-pay | Admitting: Podiatrist

## 2014-12-27 ENCOUNTER — Ambulatory Visit (INDEPENDENT_AMBULATORY_CARE_PROVIDER_SITE_OTHER): Payer: Medicare Other | Admitting: Podiatrist

## 2014-12-27 DIAGNOSIS — Q667 Congenital pes cavus: Secondary | ICD-10-CM

## 2014-12-27 DIAGNOSIS — M216X9 Other acquired deformities of unspecified foot: Secondary | ICD-10-CM

## 2014-12-27 DIAGNOSIS — B351 Tinea unguium: Secondary | ICD-10-CM

## 2014-12-27 DIAGNOSIS — M79676 Pain in unspecified toe(s): Secondary | ICD-10-CM

## 2014-12-27 DIAGNOSIS — L84 Corns and callosities: Secondary | ICD-10-CM

## 2014-12-27 NOTE — Progress Notes (Signed)
  HPI:  Patient presents today for follow up of foot and nail care. Denies any new complaints today.   Objective:  Patients chart is reviewed.  Vascular status reveals pedal pulses noted at 1 out of 4 dp and pt bilateral .  Neurological sensation is Normal to [employer] monofilament bilateral.  Patients nails are thickened, discolored, distrophic, friable and brittle with yellow-brown discoloration. Patient subjectively relates they are painful with shoes and with ambulation of bilateral feet. Right great toenail lateral border has healed well.  No sign of irritation or infection is present.  Digital contractures continue to be present which is per baseline.  Hyperkeratotic lesion present submet 5 right  Assessment:  Symptomatic onychomycosis  Plan:  Discussed treatment options and alternatives.  The symptomatic toenails were debrided through manual an mechanical means without complication.  Return appointment recommended at routine intervals of 3 months    Trudie Buckler, DPM

## 2015-03-28 ENCOUNTER — Ambulatory Visit (INDEPENDENT_AMBULATORY_CARE_PROVIDER_SITE_OTHER): Payer: Medicare Other | Admitting: Podiatry

## 2015-03-28 ENCOUNTER — Encounter: Payer: Self-pay | Admitting: Podiatry

## 2015-03-28 DIAGNOSIS — M79676 Pain in unspecified toe(s): Secondary | ICD-10-CM

## 2015-03-28 DIAGNOSIS — B351 Tinea unguium: Secondary | ICD-10-CM

## 2015-04-02 NOTE — Progress Notes (Signed)
Patient ID: Darrell Mendoza, male   DOB: 02-10-1925, 81 y.o.   MRN: [id number]  Subjective: 81 y.o.-year-old male returns the office today for painful, elongated, thickened toenails. Denies any redness or drainage around the nails. Denies any acute changes since last appointment and no new complaints today. Denies any systemic complaints such as fevers, chills, nausea, vomiting.   Objective: AAO 3, NAD DP/PT pulses palpable 1/4, CRT less than 3 seconds Protective sensation intact with [employer] monofilament, Achilles tendon reflex intact.  Nails hypertrophic, dystrophic, elongated, brittle, discolored 10. There is tenderness overlying the nails. There is no surrounding erythema or drainage along the nail sites. No open lesions or pre-ulcerative lesions are identified. No other areas of tenderness bilateral lower extremities. No overlying edema, erythema, increased warmth. Hammertoe contractures present.  No pain with calf compression, swelling, warmth, erythema.  Assessment: Patient presents with symptomatic onychomycosis  Plan: -Treatment options including alternatives, risks, complications were discussed -Nails sharply debrided 10 without complication/bleeding. -Discussed daily foot inspection. If there are any changes, to call the office immediately.  -Follow-up in 3 months or sooner if any problems are to arise. In the meantime, encouraged to call the office with any questions, concerns, changes symptoms.

## 2015-07-04 ENCOUNTER — Encounter: Payer: Self-pay | Admitting: Podiatry

## 2015-07-04 ENCOUNTER — Ambulatory Visit (INDEPENDENT_AMBULATORY_CARE_PROVIDER_SITE_OTHER): Payer: Medicare Other | Admitting: Podiatry

## 2015-07-04 DIAGNOSIS — M79676 Pain in unspecified toe(s): Secondary | ICD-10-CM

## 2015-07-04 DIAGNOSIS — L84 Corns and callosities: Secondary | ICD-10-CM

## 2015-07-04 DIAGNOSIS — B351 Tinea unguium: Secondary | ICD-10-CM | POA: Diagnosis not present

## 2015-07-04 DIAGNOSIS — I739 Peripheral vascular disease, unspecified: Secondary | ICD-10-CM

## 2015-07-04 NOTE — Progress Notes (Signed)
Patient ID: Darrell Mendoza, male   DOB: 01/16/1925, 81 y.o.   MRN: [id number]  Subjective: 81 y.o.-year-old male returns the office today for painful, elongated, thickened toenails which he is unable to trim himself. Denies any redness or drainage around the nails. Also states his calluses on long the fifth toe both feet. Denies any surrounding redness or drainage. Denies any acute changes since last appointment and no new complaints today. Denies any systemic complaints such as fevers, chills, nausea, vomiting.   Objective: AAO 3, NAD DP/PT pulses palpable 1/4, CRT less than 3 seconds Protective sensation intact with [employer] monofilament Nails hypertrophic, dystrophic, elongated, brittle, discolored 10. There is tenderness overlying the nails 1-5 bilaterally. There is no surrounding erythema or drainage along the nail sites. Submetatarsal 5 hyperkeratotic lesions. Upon debridement no underlying ulceration, drainage or other signs of infection. No open lesions or other pre-ulcerative lesions are identified. No other areas of tenderness bilateral lower extremities. No overlying edema, erythema, increased warmth. Hammertoe contractures present.  No pain with calf compression, swelling, warmth, erythema.  Assessment: Patient presents with symptomatic onychomycosis, hyperkeratotic lesions  Plan: -Treatment options including alternatives, risks, complications were discussed -Nails sharply debrided 10 without complication/bleeding -Hyperkeratotic lesion sharply debrided 2 without complication/bleeding. -Discussed daily foot inspection. If there are any changes, to call the office immediately.  -Follow-up in 3 months or sooner if any problems are to arise. In the meantime, encouraged to call the office with any questions, concerns, changes symptoms.  Celesta Gentile, DPM

## 2015-10-01 ENCOUNTER — Telehealth: Payer: Self-pay | Admitting: Cardiology

## 2015-10-03 NOTE — Telephone Encounter (Signed)
Close encounter 

## 2015-10-14 ENCOUNTER — Encounter: Payer: Self-pay | Admitting: Podiatry

## 2015-10-14 ENCOUNTER — Ambulatory Visit (INDEPENDENT_AMBULATORY_CARE_PROVIDER_SITE_OTHER): Payer: Commercial Managed Care - HMO | Admitting: Podiatry

## 2015-10-14 DIAGNOSIS — B351 Tinea unguium: Secondary | ICD-10-CM

## 2015-10-14 DIAGNOSIS — M79676 Pain in unspecified toe(s): Secondary | ICD-10-CM | POA: Diagnosis not present

## 2015-10-14 NOTE — Progress Notes (Signed)
Patient ID: Darrell Mendoza, male   DOB: 1924-11-13, 82 y.o.   MRN: [id number] Complaint:  Visit Type: Patient returns to my office for continued preventative foot care services. Complaint: Patient states" my nails have grown long and thick and become painful to walk and wear shoes". The patient presents for preventative foot care services. No changes to ROS  Podiatric Exam: Vascular: dorsalis pedis and posterior tibial pulses are palpable bilateral. Capillary return is immediate. Temperature gradient is WNL. Skin turgor WNL  Sensorium: Normal [employer] monofilament test. Normal tactile sensation bilaterally. Nail Exam: Pt has thick disfigured discolored nails with subungual debris noted bilateral entire nail hallux through fifth toenails Ulcer Exam: There is no evidence of ulcer or pre-ulcerative changes or infection. Orthopedic Exam: Muscle tone and strength are WNL. No limitations in general ROM. No crepitus or effusions noted. Foot type and digits show no abnormalities. Bony prominences are unremarkable. Skin: No Porokeratosis. No infection or ulcers  Diagnosis:  Onychomycosis, , Pain in right toe, pain in left toes  Treatment & Plan Procedures and Treatment: Consent by patient was obtained for treatment procedures. The patient understood the discussion of treatment and procedures well. All questions were answered thoroughly reviewed. Debridement of mycotic and hypertrophic toenails, 1 through 5 bilateral and clearing of subungual debris. No ulceration, no infection noted.  Return Visit-Office Procedure: Patient instructed to return to the office for a follow up visit 3 months for continued evaluation and treatment.    Gardiner Barefoot DPM

## 2015-10-22 DIAGNOSIS — Z86711 Personal history of pulmonary embolism: Secondary | ICD-10-CM | POA: Diagnosis not present

## 2015-10-22 DIAGNOSIS — Z7901 Long term (current) use of anticoagulants: Secondary | ICD-10-CM | POA: Diagnosis not present

## 2015-11-18 DIAGNOSIS — Z7901 Long term (current) use of anticoagulants: Secondary | ICD-10-CM | POA: Diagnosis not present

## 2015-11-18 DIAGNOSIS — Z86711 Personal history of pulmonary embolism: Secondary | ICD-10-CM | POA: Diagnosis not present

## 2015-12-11 ENCOUNTER — Ambulatory Visit (INDEPENDENT_AMBULATORY_CARE_PROVIDER_SITE_OTHER): Payer: Commercial Managed Care - HMO | Admitting: Cardiology

## 2015-12-11 ENCOUNTER — Encounter: Payer: Self-pay | Admitting: Cardiology

## 2015-12-11 VITALS — BP 122/60 | HR 94 | Ht 70.0 in | Wt 170.0 lb

## 2015-12-11 DIAGNOSIS — I4891 Unspecified atrial fibrillation: Secondary | ICD-10-CM | POA: Diagnosis not present

## 2015-12-11 DIAGNOSIS — E785 Hyperlipidemia, unspecified: Secondary | ICD-10-CM

## 2015-12-11 DIAGNOSIS — I25118 Atherosclerotic heart disease of native coronary artery with other forms of angina pectoris: Secondary | ICD-10-CM | POA: Diagnosis not present

## 2015-12-11 DIAGNOSIS — I2699 Other pulmonary embolism without acute cor pulmonale: Secondary | ICD-10-CM

## 2015-12-11 DIAGNOSIS — I10 Essential (primary) hypertension: Secondary | ICD-10-CM | POA: Diagnosis not present

## 2015-12-11 HISTORY — DX: Unspecified atrial fibrillation: I48.91

## 2015-12-11 NOTE — Progress Notes (Signed)
Darrell Mendoza Date of Birth: 1925-04-07 Medical Record #[id number]  History of Present Illness: Darrell Mendoza is seen for follow up of CAD. He has a history of coronary disease and underwent stenting of the mid right coronary in 2004 with a 3.0 x 23 mm Cypher stent.  His last stress test was in December of 2010 showed a basal to mid inferior wall scar without ischemia and ejection fraction 61%. His medical history is significant for a DVT with pulmonary emboli. He is on chronic Coumadin therapy. On follow up today he states he has done very well this year. He did slide out of his bed about 6 months ago and had to call EMS to help him get up.  No chest pain or SOB. He is still sedentary. Denies palpitations, dizziness, or fatigue.  Current Outpatient Prescriptions on File Prior to Visit  Medication Sig Dispense Refill  . calcium-vitamin D (OSCAL WITH D) 250-125 MG-UNIT per tablet Take 1 tablet by mouth daily.    . fish oil-omega-3 fatty acids 1000 MG capsule Take 2 g by mouth daily.    . Multiple Vitamin (MULTIVITAMIN) tablet Take 1 tablet by mouth daily.    . pravastatin (PRAVACHOL) 40 MG tablet Take 40 mg by mouth daily.    . Tamsulosin HCl (FLOMAX) 0.4 MG CAPS Take 0.4 mg by mouth daily.    . vitamin D, CHOLECALCIFEROL, 400 UNITS tablet Take 400 Units by mouth daily.    Marland Kitchen warfarin (COUMADIN) 5 MG tablet Take 5 mg by mouth daily.     No current facility-administered medications on file prior to visit.    No Known Allergies  Past Medical History  Diagnosis Date  . Pulmonary embolism ([place])     05/2011  . Hypotension   . Benign prostatic hypertrophy   . Greenfield filter in place     History of chronic lower extremity clots  . Deep vein thrombosis ([facility])     Coumadin anticoagulation for deep vein thrombosis  . Hyperlipidemia   . Hypertension      History of hypertension  . Lightheadedness   . Premature ventricular contractions   . Hand arthritis   . Coronary artery disease     s/p  3.0x23 cypher mid rca 2004  . Osteosarcoma ([place]) 2001    of the right hip  . Bilateral cataracts   . Atrial fibrillation ([place]) 12/11/2015    Past Surgical History  Procedure Laterality Date  . Stomach surgery  1972    for a hernia  . Rotator cuff repair  1985    right rotator cuff surgery  . Hip surgery  2001-2001    right hip surgery  . Cardiac catheterization  10/22/2002    left heart cath  . Excison toenail Right     HALLUX    History  Smoking status  . Former Smoker  Smokeless tobacco  . Former Systems developer  . Quit date: 10/20/1983    History  Alcohol Use  . Yes    Comment: very little    Family History  Problem Relation Age of Onset  . Tuberculosis Mother     Review of Systems: As noted per history of present illness.  All other systems were reviewed and are negative.  Physical Exam: BP 122/60 mmHg  Pulse 94  Ht '5\' 10"'$  (1.778 m)  Wt 77.111 kg (170 lb)  BMI 24.39 kg/m2 He is a pleasant elderly white male in no acute distress. The skin is warm and dry.  The HEENT exam is normal. The carotids are 2+ without bruits.  There is no thyromegaly.  There is no JVD.  The lungs are clear.    The heart exam reveals an irregular rate and rhythm and a normal S1 and S2.  There are no murmurs, gallops, or rubs.  The PMI is not displaced.   Abdominal exam reveals good bowel sounds.  There is no hepatosplenomegaly or tenderness.  There are no masses.  Exam of the legs reveal no clubbing, cyanosis, or edema.   The distal pulses are intact.  Cranial nerves II - XII are intact.  Motor and sensory functions are intact.  The gait is normal.  LABORATORY DATA: ECG today demonstrates Atrial fibrillation with rate 94. ? Old anterior infarct.   There are nonspecific ST-T changes. I have personally reviewed and interpreted this study.   Assessment / Plan: 1. Coronary disease status post stenting of the right coronary in 2004 with a Cypher stent. He is asymptomatic.  I will continue on his  current medication.  2. History of DVT with pulmonary emboli. On chronic anticoagulation with coumadin.  3. Hypertension. Blood pressure is well controlled.  4. Chronic PVCs. Not symptomatic.   5. Atrial fibrillation. New onset. Patient is asymptomatic and rate is controlled on no rate slowing medication. He is already anticoagulated on coumadin. Will check Echo. Requested copy of lab work from Dr. Osborne Casco.   I will follow up in  6 months.

## 2015-12-11 NOTE — Patient Instructions (Signed)
Continue your current therapy  I will get a copy of your lab work from Dr. Osborne Casco  We will schedule you for an Echocardiogram

## 2015-12-15 ENCOUNTER — Ambulatory Visit (HOSPITAL_COMMUNITY): Payer: Commercial Managed Care - HMO | Attending: Cardiovascular Disease

## 2015-12-15 ENCOUNTER — Other Ambulatory Visit: Payer: Self-pay

## 2015-12-15 DIAGNOSIS — E785 Hyperlipidemia, unspecified: Secondary | ICD-10-CM

## 2015-12-15 DIAGNOSIS — I25118 Atherosclerotic heart disease of native coronary artery with other forms of angina pectoris: Secondary | ICD-10-CM

## 2015-12-15 DIAGNOSIS — I2699 Other pulmonary embolism without acute cor pulmonale: Secondary | ICD-10-CM

## 2015-12-15 DIAGNOSIS — I4891 Unspecified atrial fibrillation: Secondary | ICD-10-CM | POA: Diagnosis not present

## 2015-12-15 DIAGNOSIS — I371 Nonrheumatic pulmonary valve insufficiency: Secondary | ICD-10-CM | POA: Insufficient documentation

## 2015-12-15 DIAGNOSIS — I352 Nonrheumatic aortic (valve) stenosis with insufficiency: Secondary | ICD-10-CM | POA: Insufficient documentation

## 2015-12-15 DIAGNOSIS — I10 Essential (primary) hypertension: Secondary | ICD-10-CM

## 2015-12-15 DIAGNOSIS — I340 Nonrheumatic mitral (valve) insufficiency: Secondary | ICD-10-CM | POA: Diagnosis not present

## 2015-12-15 DIAGNOSIS — I071 Rheumatic tricuspid insufficiency: Secondary | ICD-10-CM | POA: Insufficient documentation

## 2015-12-15 DIAGNOSIS — I119 Hypertensive heart disease without heart failure: Secondary | ICD-10-CM | POA: Insufficient documentation

## 2015-12-15 MED ORDER — LISINOPRIL 5 MG PO TABS: 5.0000 mg | ORAL_TABLET | Freq: Every day | ORAL | Status: DC

## 2015-12-18 DIAGNOSIS — Z7901 Long term (current) use of anticoagulants: Secondary | ICD-10-CM | POA: Diagnosis not present

## 2015-12-18 DIAGNOSIS — Z86711 Personal history of pulmonary embolism: Secondary | ICD-10-CM | POA: Diagnosis not present

## 2015-12-19 ENCOUNTER — Telehealth (HOSPITAL_COMMUNITY): Payer: Self-pay

## 2015-12-19 NOTE — Telephone Encounter (Signed)
Encounter complete. 

## 2015-12-24 ENCOUNTER — Ambulatory Visit (HOSPITAL_COMMUNITY)
Admission: RE | Admit: 2015-12-24 | Discharge: 2015-12-24 | Disposition: A | Discharge status: Home / Self Care | Payer: Commercial Managed Care - HMO | Source: Ambulatory Visit | Attending: Internal Medicine | Admitting: Internal Medicine

## 2015-12-24 DIAGNOSIS — Z87891 Personal history of nicotine dependence: Secondary | ICD-10-CM | POA: Diagnosis not present

## 2015-12-24 DIAGNOSIS — I10 Essential (primary) hypertension: Secondary | ICD-10-CM

## 2015-12-24 DIAGNOSIS — I25118 Atherosclerotic heart disease of native coronary artery with other forms of angina pectoris: Secondary | ICD-10-CM | POA: Diagnosis not present

## 2015-12-24 DIAGNOSIS — R9439 Abnormal result of other cardiovascular function study: Secondary | ICD-10-CM | POA: Diagnosis not present

## 2015-12-24 DIAGNOSIS — R42 Dizziness and giddiness: Secondary | ICD-10-CM | POA: Diagnosis not present

## 2015-12-24 LAB — MYOCARDIAL PERFUSION IMAGING
CHL CUP NUCLEAR SDS: 4
CHL CUP STRESS STAGE 1 GRADE: 0 %
CHL CUP STRESS STAGE 1 HR: 83 {beats}/min
CHL CUP STRESS STAGE 1 SBP: 118 mmHg
CHL CUP STRESS STAGE 2 GRADE: 0 %
CHL CUP STRESS STAGE 2 HR: 83 {beats}/min
CHL CUP STRESS STAGE 2 SPEED: 0 mph
CHL CUP STRESS STAGE 3 DBP: 86 mmHg
CHL CUP STRESS STAGE 3 SBP: 130 mmHg
CHL CUP STRESS STAGE 3 SPEED: 0 mph
CSEPEW: 1 METS
CSEPPHR: 86 {beats}/min
CSEPPMHR: 65 %
Peak BP: 130 mmHg
Rest HR: 87 {beats}/min
SRS: 12
SSS: 15
Stage 1 DBP: 89 mmHg
Stage 1 Speed: 0 mph
Stage 3 Grade: 0 %
Stage 3 HR: 86 {beats}/min
Stage 4 DBP: 68 mmHg
Stage 4 Grade: 0 %
Stage 4 HR: 104 {beats}/min
Stage 4 SBP: 124 mmHg
Stage 4 Speed: 0 mph
TID: 0.98

## 2015-12-24 MED ORDER — TECHNETIUM TC 99M SESTAMIBI GENERIC - CARDIOLITE
10.9000 | Freq: Once | INTRAVENOUS | Status: AC | PRN
  Administered 2015-12-24: 10.9 via INTRAVENOUS

## 2015-12-24 MED ORDER — TECHNETIUM TC 99M SESTAMIBI GENERIC - CARDIOLITE
31.0000 | Freq: Once | INTRAVENOUS | Status: AC | PRN
  Administered 2015-12-24: 31 via INTRAVENOUS

## 2015-12-24 MED ORDER — REGADENOSON 0.4 MG/5ML IV SOLN
0.4000 mg | Freq: Once | INTRAVENOUS | Status: AC
  Administered 2015-12-24: 0.4 mg via INTRAVENOUS

## 2015-12-29 DIAGNOSIS — I25118 Atherosclerotic heart disease of native coronary artery with other forms of angina pectoris: Secondary | ICD-10-CM | POA: Diagnosis not present

## 2015-12-29 DIAGNOSIS — I10 Essential (primary) hypertension: Secondary | ICD-10-CM | POA: Diagnosis not present

## 2015-12-30 LAB — BASIC METABOLIC PANEL
BUN: 40 mg/dL — ABNORMAL HIGH (ref 7–25)
CALCIUM: 9.3 mg/dL (ref 8.6–10.3)
CO2: 25 mmol/L (ref 20–31)
Chloride: 105 mmol/L (ref 98–110)
Creat: 1.87 mg/dL — ABNORMAL HIGH (ref 0.70–1.11)
Glucose, Bld: 100 mg/dL — ABNORMAL HIGH (ref 65–99)
Potassium: 5.4 mmol/L — ABNORMAL HIGH (ref 3.5–5.3)
SODIUM: 139 mmol/L (ref 135–146)

## 2015-12-30 LAB — BRAIN NATRIURETIC PEPTIDE: Brain Natriuretic Peptide: 412.9 pg/mL — ABNORMAL HIGH (ref <100)

## 2015-12-31 ENCOUNTER — Other Ambulatory Visit: Payer: Self-pay

## 2015-12-31 DIAGNOSIS — E875 Hyperkalemia: Secondary | ICD-10-CM

## 2016-01-13 ENCOUNTER — Ambulatory Visit (INDEPENDENT_AMBULATORY_CARE_PROVIDER_SITE_OTHER): Payer: Commercial Managed Care - HMO | Admitting: Podiatry

## 2016-01-13 ENCOUNTER — Encounter: Payer: Self-pay | Admitting: Podiatry

## 2016-01-13 DIAGNOSIS — B351 Tinea unguium: Secondary | ICD-10-CM | POA: Diagnosis not present

## 2016-01-13 DIAGNOSIS — M79676 Pain in unspecified toe(s): Secondary | ICD-10-CM

## 2016-01-13 DIAGNOSIS — I739 Peripheral vascular disease, unspecified: Secondary | ICD-10-CM

## 2016-01-13 NOTE — Progress Notes (Signed)
Patient ID: Darrell Mendoza, male   DOB: 04/18/1925, 82 y.o.   MRN: [id number] Complaint:  Visit Type: Patient returns to my office for continued preventative foot care services. Complaint: Patient states" my nails have grown long and thick and become painful to walk and wear shoes". The patient presents for preventative foot care services. No changes to ROS  Podiatric Exam: Vascular: dorsalis pedis and posterior tibial pulses are palpable bilateral. Capillary return is immediate. Temperature gradient is WNL. Skin turgor WNL  Sensorium: Normal [employer] monofilament test. Normal tactile sensation bilaterally. Nail Exam: Pt has thick disfigured discolored nails with subungual debris noted bilateral entire nail hallux through fifth toenails Ulcer Exam: There is no evidence of ulcer or pre-ulcerative changes or infection. Orthopedic Exam: Muscle tone and strength are WNL. No limitations in general ROM. No crepitus or effusions noted. Foot type and digits show no abnormalities. Bony prominences are unremarkable. Skin: No Porokeratosis. No infection or ulcers  Diagnosis:  Onychomycosis, , Pain in right toe, pain in left toes  Treatment & Plan Procedures and Treatment: Consent by patient was obtained for treatment procedures. The patient understood the discussion of treatment and procedures well. All questions were answered thoroughly reviewed. Debridement of mycotic and hypertrophic toenails, 1 through 5 bilateral and clearing of subungual debris. No ulceration, no infection noted.  Return Visit-Office Procedure: Patient instructed to return to the office for a follow up visit 3 months for continued evaluation and treatment.    Gardiner Barefoot DPM

## 2016-01-14 DIAGNOSIS — E875 Hyperkalemia: Secondary | ICD-10-CM | POA: Diagnosis not present

## 2016-01-15 LAB — BASIC METABOLIC PANEL
BUN: 51 mg/dL — ABNORMAL HIGH (ref 7–25)
CHLORIDE: 109 mmol/L (ref 98–110)
CO2: 20 mmol/L (ref 20–31)
CREATININE: 1.42 mg/dL — AB (ref 0.70–1.11)
Calcium: 8.6 mg/dL (ref 8.6–10.3)
Glucose, Bld: 84 mg/dL (ref 65–99)
POTASSIUM: 4.7 mmol/L (ref 3.5–5.3)
Sodium: 137 mmol/L (ref 135–146)

## 2016-01-21 DIAGNOSIS — Z7901 Long term (current) use of anticoagulants: Secondary | ICD-10-CM | POA: Diagnosis not present

## 2016-01-21 DIAGNOSIS — Z86711 Personal history of pulmonary embolism: Secondary | ICD-10-CM | POA: Diagnosis not present

## 2016-01-26 ENCOUNTER — Ambulatory Visit (INDEPENDENT_AMBULATORY_CARE_PROVIDER_SITE_OTHER): Payer: Commercial Managed Care - HMO | Admitting: Cardiology

## 2016-01-26 ENCOUNTER — Encounter: Payer: Self-pay | Admitting: Cardiology

## 2016-01-26 VITALS — BP 128/76 | HR 89 | Ht 68.0 in | Wt 170.0 lb

## 2016-01-26 DIAGNOSIS — I429 Cardiomyopathy, unspecified: Secondary | ICD-10-CM

## 2016-01-26 DIAGNOSIS — I428 Other cardiomyopathies: Secondary | ICD-10-CM

## 2016-01-26 DIAGNOSIS — I25118 Atherosclerotic heart disease of native coronary artery with other forms of angina pectoris: Secondary | ICD-10-CM

## 2016-01-26 DIAGNOSIS — I10 Essential (primary) hypertension: Secondary | ICD-10-CM | POA: Diagnosis not present

## 2016-01-26 DIAGNOSIS — E785 Hyperlipidemia, unspecified: Secondary | ICD-10-CM | POA: Diagnosis not present

## 2016-01-26 DIAGNOSIS — I5022 Chronic systolic (congestive) heart failure: Secondary | ICD-10-CM

## 2016-01-26 DIAGNOSIS — I4891 Unspecified atrial fibrillation: Secondary | ICD-10-CM

## 2016-01-26 MED ORDER — CARVEDILOL 3.125 MG PO TABS: 3.1250 mg | ORAL_TABLET | Freq: Two times a day (BID) | ORAL | Status: DC

## 2016-01-26 NOTE — Patient Instructions (Signed)
Add carvedilol 3.125 mg twice a day  Continue your other therapy  I will see you in 3 months with lab work

## 2016-01-26 NOTE — Progress Notes (Signed)
Darrell Mendoza Date of Birth: 03-Jun-1925 Medical Record #[id number]  History of Present Illness: Darrell Mendoza is seen for follow up of CAD and Afib. He has a history of coronary disease and underwent stenting of the mid right coronary in 2004 with a 3.0 x 23 mm Cypher stent.  Prior stress test was in December of 2010 showed a basal to mid inferior wall scar without ischemia and ejection fraction 61%. His medical history is significant for a DVT with pulmonary emboli. He is on chronic Coumadin therapy. On his last visit he was noted to be in Afib- new onset. He was asymptomatic. Rate was OK. He had an Echo done that showed EF 30-35%. Myoview study showed inferior scar without ischemia. He was started on low dose lisinopril 5 mg daily. BMET showed mildly elevated potassium. BNP elevated 416. Repeat BMET showed normal potassium and renal indices were normal.  On follow up today he continues to do well.  No chest pain or SOB. He is still sedentary. Walks with a cane. Denies palpitations, dizziness, or fatigue.  Current Outpatient Prescriptions on File Prior to Visit  Medication Sig Dispense Refill  . calcium-vitamin D (OSCAL WITH D) 250-125 MG-UNIT per tablet Take 1 tablet by mouth daily.    . fish oil-omega-3 fatty acids 1000 MG capsule Take 2 g by mouth daily.    Marland Kitchen lisinopril (PRINIVIL,ZESTRIL) 5 MG tablet Take 1 tablet (5 mg total) by mouth daily. 30 tablet 6  . Multiple Vitamin (MULTIVITAMIN) tablet Take 1 tablet by mouth daily.    . pravastatin (PRAVACHOL) 40 MG tablet Take 40 mg by mouth daily.    . Tamsulosin HCl (FLOMAX) 0.4 MG CAPS Take 0.4 mg by mouth daily.    . vitamin D, CHOLECALCIFEROL, 400 UNITS tablet Take 400 Units by mouth daily.    Marland Kitchen warfarin (COUMADIN) 5 MG tablet Take 5 mg by mouth daily.     No current facility-administered medications on file prior to visit.    No Known Allergies  Past Medical History  Diagnosis Date  . Pulmonary embolism ([place])     05/2011  .  Hypotension   . Benign prostatic hypertrophy   . Greenfield filter in place     History of chronic lower extremity clots  . Deep vein thrombosis (HCC)     Coumadin anticoagulation for deep vein thrombosis  . Hyperlipidemia   . Hypertension      History of hypertension  . Lightheadedness   . Premature ventricular contractions   . Hand arthritis   . Coronary artery disease     s/p 3.0x23 cypher mid rca 2004  . 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 Bilateral cataracts   . Atrial fibrillation ([place]) 12/11/2015  . CKD (chronic kidney disease) stage 3, GFR 30-59 ml/min     Past Surgical History  Procedure Laterality Date  . Stomach surgery  1972    for a hernia  . Rotator cuff repair  1985    right rotator cuff surgery  . Hip surgery  2001-2001    right hip surgery  . Cardiac catheterization  10/22/2002    left heart cath  . Excison toenail Right     HALLUX    History  Smoking status  . Former Smoker  Smokeless tobacco  . Former Systems developer  . Quit date: 10/20/1983    History  Alcohol Use  . Yes    Comment: very little    Family History  Problem Relation Age of Onset  . Tuberculosis Mother     Review of Systems: As noted per history of present illness.  All other systems were reviewed and are negative.  Physical Exam: BP 128/76 mmHg  Pulse 89  Ht '5\' 8"'$  (1.727 m)  Wt 77.111 kg (170 lb)  BMI 25.85 kg/m2 He is a pleasant elderly white male in no acute distress. The skin is warm and dry.   The HEENT exam is normal. The carotids are 2+ without bruits.  There is no thyromegaly.  There is no JVD.  The lungs are clear.    The heart exam reveals an irregular rate and rhythm and a normal S1 and S2.  There are no murmurs, gallops, or rubs.  The PMI is not displaced.   Abdominal exam reveals good bowel sounds.  There is no hepatosplenomegaly or tenderness.  There are no masses.  Exam of the legs reveal no clubbing, cyanosis, or edema.   The distal pulses are intact.   Cranial nerves II - XII are intact.  Motor and sensory functions are intact.  The gait is normal.  LABORATORY DATA: Echo: 12/15/15:Study Conclusions  - Left ventricle: The cavity size was normal. There was mild  concentric hypertrophy. Systolic function was moderately to  severely reduced. The estimated ejection fraction was in the  range of 30% to 35%. Diffuse hypokinesis. - Aortic valve: Valve mobility was mildly restricted. There was  very mild stenosis. There was mild regurgitation. Peak velocity  (S): 216.21 cm/s. Mean gradient (S): 10 mm Hg. Valve area (VTI):  1.84 cm^2. Valve area (Vmax): 1.98 cm^2. Valve area (Vmean): 1.83  cm^2. - Mitral valve: Transvalvular velocity was within the normal range.  There was no evidence for stenosis. There was mild to moderate  regurgitation. - Left atrium: The atrium was severely dilated. - Right ventricle: The cavity size was normal. Wall thickness was  normal. Systolic function was normal. - Tricuspid valve: There was moderate regurgitation. - Pulmonic valve: There was trivial regurgitation. - Pulmonary arteries: Systolic pressure was moderately increased.  PA peak pressure: 47 mm Hg (S). - Inferior vena cava: The vessel was normal in size. The  respirophasic diameter changes were in the normal range (>= 50%),  consistent with normal central venous pressure.  Myoview 12/24/15:  Study Highlights     There was no ST segment deviation noted during stress.  Defect 1: There is a medium defect of severe severity present in the basal inferoseptal, basal inferior, mid inferoseptal and mid inferior location.  Findings consistent with prior myocardial infarction with very mild peri-infarct ischemia in the inferior wall.  This is a low risk study.    Assessment / Plan: 1. Coronary disease status post stenting of the right coronary in 2004 with a Cypher stent. He is asymptomatic. Myoview showed inferior scar without ischemia.   2.  History of DVT with pulmonary emboli. On chronic anticoagulation with coumadin.  3. Hypertension. Blood pressure is well controlled.  4. Chronic PVCs. Not symptomatic.   5. Atrial fibrillation. New onset- unknown duration. Patient is asymptomatic and rate is controlled on no rate slowing medication. He is already anticoagulated on coumadin.   6. Mixed ischemic/nonischemic cardiomyopathy. EF 30-35%. No overt CHF. Will continue low dose lisinopril. Add Coreg 3.125 mg bid. Follow up in 3 months with BMET.  7. CKD stage 3.

## 2016-02-11 ENCOUNTER — Emergency Department (HOSPITAL_COMMUNITY): Payer: Commercial Managed Care - HMO

## 2016-02-11 ENCOUNTER — Inpatient Hospital Stay (HOSPITAL_COMMUNITY)
Admission: EM | Admit: 2016-02-11 | Discharge: 2016-02-13 | DRG: 189 | Disposition: A | Discharge status: Home Health | Payer: Commercial Managed Care - HMO | Attending: Internal Medicine | Admitting: Internal Medicine

## 2016-02-11 ENCOUNTER — Encounter (HOSPITAL_COMMUNITY): Payer: Self-pay | Admitting: Emergency Medicine

## 2016-02-11 DIAGNOSIS — I509 Heart failure, unspecified: Secondary | ICD-10-CM | POA: Diagnosis not present

## 2016-02-11 DIAGNOSIS — I4891 Unspecified atrial fibrillation: Secondary | ICD-10-CM | POA: Diagnosis present

## 2016-02-11 DIAGNOSIS — Y92002 Bathroom of unspecified non-institutional (private) residence single-family (private) house as the place of occurrence of the external cause: Secondary | ICD-10-CM

## 2016-02-11 DIAGNOSIS — Z85830 Personal history of malignant neoplasm of bone: Secondary | ICD-10-CM

## 2016-02-11 DIAGNOSIS — Z515 Encounter for palliative care: Secondary | ICD-10-CM

## 2016-02-11 DIAGNOSIS — S3991XA Unspecified injury of abdomen, initial encounter: Secondary | ICD-10-CM | POA: Diagnosis not present

## 2016-02-11 DIAGNOSIS — E785 Hyperlipidemia, unspecified: Secondary | ICD-10-CM | POA: Diagnosis present

## 2016-02-11 DIAGNOSIS — I130 Hypertensive heart and chronic kidney disease with heart failure and stage 1 through stage 4 chronic kidney disease, or unspecified chronic kidney disease: Secondary | ICD-10-CM | POA: Diagnosis not present

## 2016-02-11 DIAGNOSIS — W010XXA Fall on same level from slipping, tripping and stumbling without subsequent striking against object, initial encounter: Secondary | ICD-10-CM | POA: Diagnosis present

## 2016-02-11 DIAGNOSIS — M4856XA Collapsed vertebra, not elsewhere classified, lumbar region, initial encounter for fracture: Secondary | ICD-10-CM | POA: Diagnosis not present

## 2016-02-11 DIAGNOSIS — Z96641 Presence of right artificial hip joint: Secondary | ICD-10-CM | POA: Diagnosis present

## 2016-02-11 DIAGNOSIS — W19XXXA Unspecified fall, initial encounter: Secondary | ICD-10-CM

## 2016-02-11 DIAGNOSIS — I129 Hypertensive chronic kidney disease with stage 1 through stage 4 chronic kidney disease, or unspecified chronic kidney disease: Secondary | ICD-10-CM | POA: Diagnosis not present

## 2016-02-11 DIAGNOSIS — M8580 Other specified disorders of bone density and structure, unspecified site: Secondary | ICD-10-CM | POA: Diagnosis present

## 2016-02-11 DIAGNOSIS — R079 Chest pain, unspecified: Secondary | ICD-10-CM | POA: Diagnosis not present

## 2016-02-11 DIAGNOSIS — I5022 Chronic systolic (congestive) heart failure: Secondary | ICD-10-CM | POA: Diagnosis not present

## 2016-02-11 DIAGNOSIS — N390 Urinary tract infection, site not specified: Secondary | ICD-10-CM | POA: Diagnosis not present

## 2016-02-11 DIAGNOSIS — S0990XA Unspecified injury of head, initial encounter: Secondary | ICD-10-CM | POA: Diagnosis not present

## 2016-02-11 DIAGNOSIS — M25551 Pain in right hip: Secondary | ICD-10-CM | POA: Diagnosis not present

## 2016-02-11 DIAGNOSIS — Z9981 Dependence on supplemental oxygen: Secondary | ICD-10-CM

## 2016-02-11 DIAGNOSIS — Z87891 Personal history of nicotine dependence: Secondary | ICD-10-CM

## 2016-02-11 DIAGNOSIS — K8020 Calculus of gallbladder without cholecystitis without obstruction: Secondary | ICD-10-CM | POA: Diagnosis present

## 2016-02-11 DIAGNOSIS — Z86718 Personal history of other venous thrombosis and embolism: Secondary | ICD-10-CM

## 2016-02-11 DIAGNOSIS — Z79899 Other long term (current) drug therapy: Secondary | ICD-10-CM | POA: Diagnosis not present

## 2016-02-11 DIAGNOSIS — I2510 Atherosclerotic heart disease of native coronary artery without angina pectoris: Secondary | ICD-10-CM | POA: Diagnosis present

## 2016-02-11 DIAGNOSIS — N183 Chronic kidney disease, stage 3 (moderate): Secondary | ICD-10-CM | POA: Diagnosis present

## 2016-02-11 DIAGNOSIS — Z7901 Long term (current) use of anticoagulants: Secondary | ICD-10-CM | POA: Diagnosis not present

## 2016-02-11 DIAGNOSIS — J9601 Acute respiratory failure with hypoxia: Secondary | ICD-10-CM | POA: Diagnosis not present

## 2016-02-11 DIAGNOSIS — S79911A Unspecified injury of right hip, initial encounter: Secondary | ICD-10-CM | POA: Diagnosis not present

## 2016-02-11 DIAGNOSIS — I959 Hypotension, unspecified: Secondary | ICD-10-CM | POA: Diagnosis not present

## 2016-02-11 DIAGNOSIS — S299XXA Unspecified injury of thorax, initial encounter: Secondary | ICD-10-CM | POA: Diagnosis not present

## 2016-02-11 DIAGNOSIS — Z86711 Personal history of pulmonary embolism: Secondary | ICD-10-CM | POA: Diagnosis not present

## 2016-02-11 DIAGNOSIS — Z66 Do not resuscitate: Secondary | ICD-10-CM | POA: Diagnosis not present

## 2016-02-11 DIAGNOSIS — R531 Weakness: Secondary | ICD-10-CM

## 2016-02-11 DIAGNOSIS — S32010A Wedge compression fracture of first lumbar vertebra, initial encounter for closed fracture: Secondary | ICD-10-CM | POA: Diagnosis not present

## 2016-02-11 DIAGNOSIS — N400 Enlarged prostate without lower urinary tract symptoms: Secondary | ICD-10-CM | POA: Diagnosis present

## 2016-02-11 DIAGNOSIS — R911 Solitary pulmonary nodule: Secondary | ICD-10-CM | POA: Diagnosis present

## 2016-02-11 DIAGNOSIS — R0902 Hypoxemia: Secondary | ICD-10-CM

## 2016-02-11 DIAGNOSIS — R918 Other nonspecific abnormal finding of lung field: Secondary | ICD-10-CM | POA: Insufficient documentation

## 2016-02-11 DIAGNOSIS — I481 Persistent atrial fibrillation: Secondary | ICD-10-CM | POA: Diagnosis not present

## 2016-02-11 LAB — CBC WITH DIFFERENTIAL/PLATELET
Basophils Absolute: 0 10*3/uL (ref 0.0–0.1)
Basophils Relative: 0 %
EOS PCT: 2 %
Eosinophils Absolute: 0.1 10*3/uL (ref 0.0–0.7)
HCT: 36.5 % — ABNORMAL LOW (ref 39.0–52.0)
Hemoglobin: 11.8 g/dL — ABNORMAL LOW (ref 13.0–17.0)
LYMPHS ABS: 0.9 10*3/uL (ref 0.7–4.0)
Lymphocytes Relative: 18 %
MCH: 30.2 pg (ref 26.0–34.0)
MCHC: 32.3 g/dL (ref 30.0–36.0)
MCV: 93.4 fL (ref 78.0–100.0)
MONO ABS: 0.3 10*3/uL (ref 0.1–1.0)
Monocytes Relative: 6 %
Neutro Abs: 3.7 10*3/uL (ref 1.7–7.7)
Neutrophils Relative %: 74 %
PLATELETS: 139 10*3/uL — AB (ref 150–400)
RBC: 3.91 MIL/uL — AB (ref 4.22–5.81)
RDW: 15.6 % — AB (ref 11.5–15.5)
WBC: 5 10*3/uL (ref 4.0–10.5)

## 2016-02-11 LAB — I-STAT CHEM 8, ED
BUN: 37 mg/dL — AB (ref 6–20)
CHLORIDE: 106 mmol/L (ref 101–111)
Calcium, Ion: 1.14 mmol/L (ref 1.13–1.30)
Creatinine, Ser: 1.8 mg/dL — ABNORMAL HIGH (ref 0.61–1.24)
Glucose, Bld: 117 mg/dL — ABNORMAL HIGH (ref 65–99)
HEMATOCRIT: 39 % (ref 39.0–52.0)
Hemoglobin: 13.3 g/dL (ref 13.0–17.0)
POTASSIUM: 4.9 mmol/L (ref 3.5–5.1)
SODIUM: 139 mmol/L (ref 135–145)
TCO2: 22 mmol/L (ref 0–100)

## 2016-02-11 LAB — URINE MICROSCOPIC-ADD ON

## 2016-02-11 LAB — COMPREHENSIVE METABOLIC PANEL
ALT: 15 U/L — ABNORMAL LOW (ref 17–63)
AST: 32 U/L (ref 15–41)
Albumin: 3.3 g/dL — ABNORMAL LOW (ref 3.5–5.0)
Alkaline Phosphatase: 64 U/L (ref 38–126)
Anion gap: 11 (ref 5–15)
BUN: 37 mg/dL — AB (ref 6–20)
CHLORIDE: 107 mmol/L (ref 101–111)
CO2: 20 mmol/L — ABNORMAL LOW (ref 22–32)
Calcium: 9.1 mg/dL (ref 8.9–10.3)
Creatinine, Ser: 1.96 mg/dL — ABNORMAL HIGH (ref 0.61–1.24)
GFR, EST AFRICAN AMERICAN: 33 mL/min — AB (ref >60)
GFR, EST NON AFRICAN AMERICAN: 29 mL/min — AB (ref >60)
Glucose, Bld: 118 mg/dL — ABNORMAL HIGH (ref 65–99)
POTASSIUM: 5.1 mmol/L (ref 3.5–5.1)
Sodium: 138 mmol/L (ref 135–145)
Total Bilirubin: 0.9 mg/dL (ref 0.3–1.2)
Total Protein: 7 g/dL (ref 6.5–8.1)

## 2016-02-11 LAB — URINALYSIS, ROUTINE W REFLEX MICROSCOPIC
Bilirubin Urine: NEGATIVE
Glucose, UA: NEGATIVE mg/dL
Ketones, ur: 15 mg/dL — AB
NITRITE: NEGATIVE
Protein, ur: NEGATIVE mg/dL
SPECIFIC GRAVITY, URINE: 1.018 (ref 1.005–1.030)
pH: 5.5 (ref 5.0–8.0)

## 2016-02-11 LAB — PROTIME-INR
INR: 2.92 — AB (ref 0.00–1.49)
Prothrombin Time: 30 seconds — ABNORMAL HIGH (ref 11.6–15.2)

## 2016-02-11 MED ORDER — LISINOPRIL 5 MG PO TABS
5.0000 mg | ORAL_TABLET | Freq: Every day | ORAL | Status: DC
  Filled 2016-02-11: qty 1

## 2016-02-11 MED ORDER — VITAMIN D 1000 UNITS PO TABS
1000.0000 [IU] | ORAL_TABLET | Freq: Every day | ORAL | Status: DC
  Administered 2016-02-12 – 2016-02-13 (×2): 1000 [IU] via ORAL
  Filled 2016-02-11 (×2): qty 1

## 2016-02-11 MED ORDER — FINASTERIDE 5 MG PO TABS
5.0000 mg | ORAL_TABLET | Freq: Every day | ORAL | Status: DC
  Administered 2016-02-12 – 2016-02-13 (×2): 5 mg via ORAL
  Filled 2016-02-11 (×2): qty 1

## 2016-02-11 MED ORDER — FENTANYL CITRATE (PF) 100 MCG/2ML IJ SOLN
25.0000 ug | Freq: Once | INTRAMUSCULAR | Status: AC
  Administered 2016-02-11: 25 ug via INTRAVENOUS
  Filled 2016-02-11: qty 2

## 2016-02-11 MED ORDER — WARFARIN SODIUM 2.5 MG PO TABS
2.5000 mg | ORAL_TABLET | Freq: Once | ORAL | Status: AC
  Administered 2016-02-12: 2.5 mg via ORAL
  Filled 2016-02-11: qty 1

## 2016-02-11 MED ORDER — DEXTROSE 5 % IV SOLN
1.0000 g | INTRAVENOUS | Status: DC
  Administered 2016-02-12: 1 g via INTRAVENOUS
  Filled 2016-02-11 (×2): qty 10

## 2016-02-11 MED ORDER — CARVEDILOL 3.125 MG PO TABS
3.1250 mg | ORAL_TABLET | Freq: Once | ORAL | Status: DC
  Filled 2016-02-11: qty 1

## 2016-02-11 MED ORDER — DEXTROSE 5 % IV SOLN
1.0000 g | Freq: Once | INTRAVENOUS | Status: AC
  Administered 2016-02-11: 1 g via INTRAVENOUS
  Filled 2016-02-11: qty 10

## 2016-02-11 MED ORDER — CARVEDILOL 3.125 MG PO TABS
3.1250 mg | ORAL_TABLET | Freq: Two times a day (BID) | ORAL | Status: DC
  Administered 2016-02-12 – 2016-02-13 (×3): 3.125 mg via ORAL
  Filled 2016-02-11 (×3): qty 1

## 2016-02-11 MED ORDER — TAMSULOSIN HCL 0.4 MG PO CAPS
0.4000 mg | ORAL_CAPSULE | Freq: Every day | ORAL | Status: DC
  Administered 2016-02-12 – 2016-02-13 (×2): 0.4 mg via ORAL
  Filled 2016-02-11 (×2): qty 1

## 2016-02-11 MED ORDER — ADULT MULTIVITAMIN W/MINERALS CH
1.0000 | ORAL_TABLET | Freq: Every day | ORAL | Status: DC
  Administered 2016-02-12 – 2016-02-13 (×2): 1 via ORAL
  Filled 2016-02-11 (×2): qty 1

## 2016-02-11 MED ORDER — HYDROCODONE-ACETAMINOPHEN 5-325 MG PO TABS
1.0000 | ORAL_TABLET | Freq: Once | ORAL | Status: AC
  Administered 2016-02-11: 1 via ORAL
  Filled 2016-02-11: qty 1

## 2016-02-11 MED ORDER — HYDROCODONE-ACETAMINOPHEN 5-325 MG PO TABS
1.0000 | ORAL_TABLET | ORAL | Status: DC | PRN
  Administered 2016-02-12 (×2): 2 via ORAL
  Administered 2016-02-12: 1 via ORAL
  Filled 2016-02-11: qty 1
  Filled 2016-02-11 (×2): qty 2

## 2016-02-11 MED ORDER — CARVEDILOL 3.125 MG PO TABS
3.1250 mg | ORAL_TABLET | Freq: Two times a day (BID) | ORAL | Status: DC
  Filled 2016-02-11 (×2): qty 1

## 2016-02-11 MED ORDER — OMEGA-3-ACID ETHYL ESTERS 1 G PO CAPS
2.0000 g | ORAL_CAPSULE | Freq: Every day | ORAL | Status: DC
  Administered 2016-02-12 – 2016-02-13 (×2): 2 g via ORAL
  Filled 2016-02-11 (×2): qty 2

## 2016-02-11 MED ORDER — CALCIUM CARBONATE-VITAMIN D 500-200 MG-UNIT PO TABS
1.0000 | ORAL_TABLET | Freq: Every day | ORAL | Status: DC
  Administered 2016-02-12 – 2016-02-13 (×2): 1 via ORAL
  Filled 2016-02-11 (×2): qty 1

## 2016-02-11 MED ORDER — WARFARIN - PHARMACIST DOSING INPATIENT: Freq: Every day | Status: DC

## 2016-02-11 MED ORDER — SODIUM CHLORIDE 0.9 % IV BOLUS (SEPSIS)
1000.0000 mL | Freq: Once | INTRAVENOUS | Status: AC
  Administered 2016-02-11: 1000 mL via INTRAVENOUS

## 2016-02-11 MED ORDER — PRAVASTATIN SODIUM 40 MG PO TABS
40.0000 mg | ORAL_TABLET | Freq: Every day | ORAL | Status: DC
  Administered 2016-02-12 – 2016-02-13 (×2): 40 mg via ORAL
  Filled 2016-02-11 (×2): qty 1

## 2016-02-11 NOTE — H&P (Signed)
History and Physical    R[facility] Tolson QVZ:[id number] DOB: 04-26-1925 DOA: 02/11/2016  Referring MD/NP/PA: Dr. Ellender Hose PCP: Haywood Pao, MD Outpatient Specialists: None Patient coming from: Home  Chief Complaint: Fall, hip / back pain  HPI: Darrell Mendoza is a 82 y.o. 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  Patient presents to the ED with c/o back pain that radiates to R hip s/p fall.  Patient tripped on a rug in the bathroom 2 days ago.  Fell on to right chest and hip with immediate onset of severe pain.  Was unable to get up, called son who brought patient to bed.  Since then he has been unable to ambulate.  Pain is constant, aching.  Worse with movement or weight bearing.  ED Course: CT abd/pelvis and X ray of hip are negative for hip fracture.  Does identify an L1 vertebral compression fracture without significant loss of hight, this does appear acute and patient has significant tenderness at this point on exam.  He is also found to have a UTI, and a 3.2cm lung nodule / mass.  Patient is able to ambulate with assistance in the ED; however, he has hypoxia to the low 80s when he does so and has to be put on O2 via [place].  Review of Systems: As per HPI otherwise 10 point review of systems negative.    Past Medical History  Diagnosis Date  . Pulmonary embolism ([place])     05/2011  . Hypotension   . Benign prostatic hypertrophy   . Greenfield filter in place     History of chronic lower extremity clots  . Deep vein thrombosis (HCC)     Coumadin anticoagulation for deep vein thrombosis  . Hyperlipidemia   . Hypertension      History of hypertension  . Lightheadedness   . Premature ventricular contractions   . Hand arthritis   . Coronary artery disease     s/p 3.0x23 cypher mid rca 2004  . 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 Bilateral cataracts   . Atrial fibrillation ([place])  12/11/2015  . CKD (chronic kidney disease) stage 3, GFR 30-59 ml/min     Past Surgical History  Procedure Laterality Date  . Stomach surgery  1972    for a hernia  . Rotator cuff repair  1985    right rotator cuff surgery  . Hip surgery  2001-2001    right hip surgery  . Cardiac catheterization  10/22/2002    left heart cath  . Excison toenail Right     HALLUX     reports that he has quit smoking. He quit smokeless tobacco use about 32 years ago. He reports that he drinks alcohol. He reports that he does not use illicit drugs.  No Known Allergies  Family History  Problem Relation Age of Onset  . Tuberculosis Mother      Prior to Admission medications   Medication Sig Start Date End Date Taking? Authorizing Provider  calcium-vitamin D (OSCAL WITH D) 250-125 MG-UNIT per tablet Take 1 tablet by mouth daily.   Yes Historical Provider, MD  carvedilol (COREG) 3.125 MG tablet Take 1 tablet (3.125 mg total) by mouth 2 (two) times daily. 01/26/16  Yes Peter M Martinique, MD  finasteride (PROSCAR) 5 MG tablet Take 5 mg by mouth daily. 12/26/15  Yes Historical Provider, MD  fish oil-omega-3 fatty acids  1000 MG capsule Take 2 g by mouth daily.   Yes Historical Provider, MD  lisinopril (PRINIVIL,ZESTRIL) 5 MG tablet Take 1 tablet (5 mg total) by mouth daily. 12/15/15  Yes Peter M Martinique, MD  Multiple Vitamin (MULTIVITAMIN) tablet Take 1 tablet by mouth daily.   Yes Historical Provider, MD  pravastatin (PRAVACHOL) 40 MG tablet Take 40 mg by mouth daily.   Yes Historical Provider, MD  Tamsulosin HCl (FLOMAX) 0.4 MG CAPS Take 0.4 mg by mouth daily.   Yes Historical Provider, MD  vitamin D, CHOLECALCIFEROL, 400 UNITS tablet Take 400 Units by mouth daily.   Yes Historical Provider, MD  warfarin (COUMADIN) 5 MG tablet Take 2.5-5 mg by mouth daily. Take 2.5 mg Sun / Tues / Wed / Thurs / Sat Take 5 mg on Mon / Fri   Yes Historical Provider, MD    Physical Exam: Filed Vitals:   02/11/16 2045 02/11/16  2100 02/11/16 2115 02/11/16 2130  BP: 113/83 104/64 112/69 101/62  Pulse: 122 37    Temp:      Resp: '20 15 23 23  '$ SpO2: 97% 86%        Constitutional: NAD, calm, comfortable Filed Vitals:   02/11/16 2045 02/11/16 2100 02/11/16 2115 02/11/16 2130  BP: 113/83 104/64 112/69 101/62  Pulse: 122 37    Temp:      Resp: '20 15 23 23  '$ SpO2: 97% 86%     Eyes: PERRL, lids and conjunctivae normal ENMT: Mucous membranes are moist. Posterior pharynx clear of any exudate or lesions.Normal dentition.  Neck: normal, supple, no masses, no thyromegaly Respiratory: clear to auscultation bilaterally, no wheezing, no crackles. Normal respiratory effort. No accessory muscle use.  Cardiovascular: Regular rate and rhythm, no murmurs / rubs / gallops. No extremity edema. 2+ pedal pulses. No carotid bruits.  Abdomen: no tenderness, no masses palpated. No hepatosplenomegaly. Bowel sounds positive.  Musculoskeletal: no clubbing / cyanosis. No joint deformity upper and lower extremities. Good ROM, no contractures. Normal muscle tone.  Skin: no rashes, lesions, ulcers. No induration Neurologic: CN 2-12 grossly intact. Sensation intact, DTR normal. Strength 5/5 in all 4.  Psychiatric: Normal judgment and insight. Alert and oriented x 3. Normal mood.    Labs on Admission: I have personally reviewed following labs and imaging studies  CBC:  Recent Labs Lab 02/11/16 1614 02/11/16 1625  WBC 5.0  --   NEUTROABS 3.7  --   HGB 11.8* 13.3  HCT 36.5* 39.0  MCV 93.4  --   PLT 139*  --    Basic Metabolic Panel:  Recent Labs Lab 02/11/16 1614 02/11/16 1625  NA 138 139  K 5.1 4.9  CL 107 106  CO2 20*  --   GLUCOSE 118* 117*  BUN 37* 37*  CREATININE 1.96* 1.80*  CALCIUM 9.1  --    GFR: CrCl cannot be calculated (Unknown ideal weight.). Liver Function Tests:  Recent Labs Lab 02/11/16 1614  AST 32  ALT 15*  ALKPHOS 64  BILITOT 0.9  PROT 7.0  ALBUMIN 3.3*   No results for input(s): LIPASE,  AMYLASE in the last 168 hours. No results for input(s): AMMONIA in the last 168 hours. Coagulation Profile:  Recent Labs Lab 02/11/16 1614  INR 2.92*   Cardiac Enzymes: No results for input(s): CKTOTAL, CKMB, CKMBINDEX, TROPONINI in the last 168 hours. BNP (last 3 results) No results for input(s): PROBNP in the last 8760 hours. HbA1C: No results for input(s): HGBA1C in the last 72 hours.  CBG: No results for input(s): GLUCAP in the last 168 hours. Lipid Profile: No results for input(s): CHOL, HDL, LDLCALC, TRIG, CHOLHDL, LDLDIRECT in the last 72 hours. Thyroid Function Tests: No results for input(s): TSH, T4TOTAL, FREET4, T3FREE, THYROIDAB in the last 72 hours. Anemia Panel: No results for input(s): VITAMINB12, FOLATE, FERRITIN, TIBC, IRON, RETICCTPCT in the last 72 hours. Urine analysis:    Component Value Date/Time   COLORURINE YELLOW 02/11/2016 1631   APPEARANCEUR CLOUDY* 02/11/2016 1631   LABSPEC 1.018 02/11/2016 1631   PHURINE 5.5 02/11/2016 1631   GLUCOSEU NEGATIVE 02/11/2016 1631   HGBUR SMALL* 02/11/2016 1631   BILIRUBINUR NEGATIVE 02/11/2016 1631   KETONESUR 15* 02/11/2016 1631   PROTEINUR NEGATIVE 02/11/2016 1631   UROBILINOGEN 1.0 05/23/2011 1803   NITRITE NEGATIVE 02/11/2016 1631   LEUKOCYTESUR LARGE* 02/11/2016 1631   Sepsis Labs: '@LABRCNTIP'$ (procalcitonin:4,lacticidven:4) )No results found for this or any previous visit (from the past 240 hour(s)).   Radiological Exams on Admission: Ct Abdomen Pelvis Wo Contrast  02/11/2016  CLINICAL DATA:  Tripped and fell on right side, history of hip replacement, low back pain EXAM: CT ABDOMEN AND PELVIS WITHOUT CONTRAST TECHNIQUE: Multidetector CT imaging of the abdomen and pelvis was performed following the standard protocol without IV contrast. COMPARISON:  None. FINDINGS: Lower chest: Images of the lung bases shows bilateral emphysematous changes. Peripheral fibrotic changes and honeycombing in noted. Atherosclerotic  calcifications of coronary arteries. There is moderate size hiatal hernia measures at least 6.8 cm. Hepatobiliary: Unenhanced liver shows no biliary ductal dilatation. No perihepatic ascites. Calcified gallstone within gallbladder measures 1.4 cm. Pancreas: Unenhanced pancreas is mild atrophic. Spleen: Unenhanced spleen is unremarkable. Adrenals/Urinary Tract: No adrenal gland mass. Unenhanced kidneys are atrophic with cortical thinning. No nephrolithiasis. No hydronephrosis or hydroureter. No calcified ureteral calculi are noted. Stomach/Bowel: Study is limited without IV or oral contrast. No gastric outlet obstruction. No small bowel obstruction. No thickened or dilated small bowel loops are noted. There is no pericecal inflammation. The terminal ileum is unremarkable. Vascular/Lymphatic: IVC filter in place is noted. Extensive atherosclerotic calcifications of abdominal aorta, SMA, splenic artery and bilateral common iliac arteries. There is no aortic aneurysm. Reproductive: Limited evaluation of the pelvis due to extensive metallic artifacts from right hip prosthesis. The visualized prostate gland is unremarkable. Other: No ascites or free air. Musculoskeletal: There is diffuse osteopenia. Multilevel degenerative changes are noted thoracolumbar spine. Posterior metallic fusion noted at S3-M1 level. There is disc space flattening with vacuum disc phenomenon mild anterior and mild posterior spurring at L2-L3 level. Axial image 26 there is mild compression fracture upper endplate of L1 vertebral body. Best seen in sagittal image 63. This is highly suspicious for acute or subacute compression fracture. There is no bony protrusion in spinal canal at this level. No spinal canal stenosis. No paraspinal hematoma. There is right hip prosthesis with anatomic alignment. No gross fracture or subluxation. No sacral insufficiency fracture is identified. Degenerative changes are noted right SI joint. IMPRESSION: 1.  Emphysematous and fibrotic changes are noted lung bases. 2. Calcified gallstone within gallbladder measures 1.4 cm. 3. Extensive atherosclerotic vascular calcifications are noted. 4. Bilateral atrophic kidneys without hydronephrosis or hydroureter. No calcified ureteral calculi. 5. IVC filter in place. 6. Limited evaluation of the pelvis due to extensive metallic artifacts from right hip prosthesis. No gross acute fractures are identified within pelvis. 7. There is disc space flattening with vacuum disc phenomenon mild anterior and mild posterior spurring at L2-L3 level. Axial image 26 there is mild  compression fracture upper endplate of L1 vertebral body. Best seen in sagittal image 63. This is highly suspicious for acute or subacute compression fracture. There is no bony protrusion in spinal canal at this level. No spinal canal stenosis. No paraspinal hematoma. 8. Posterior metallic fusion lumbar spine L5-S1 level. No sacral insufficiency fracture is identified. Electronically Signed   By: Lahoma Crocker M.D.   On: 02/11/2016 17:23   Dg Chest 1 View  02/11/2016  CLINICAL DATA:  Post fall yesterday, now with severe back pain. EXAM: CHEST 1 VIEW COMPARISON:  05/23/2011; CT abdomen pelvis- 02/11/2016 FINDINGS: Enlarged cardiac silhouette and mediastinal contours, progressed since the 05/2011 examination. Advanced emphysematous change with thinning of the biapical pulmonary parenchymal, left greater than right, and diffuse slightly nodular thickening of the pulmonary interstitium. Interval development of a approximately 3.2 x 2.5 cm nodular opacity/mass within the peripheral aspect of the right upper lung. Slight worsening of left basilar / retrocardiac opacities, likely atelectasis. No definite pleural effusion or pneumothorax. No definite evidence of edema. No definite acute osseous abnormalities. IMPRESSION: 1. Advanced emphysematous change without definitive superimposed acute cardiopulmonary disease. 2. Interval  development of an apparent 3.2 cm nodule/mass within the peripheral aspect of the right upper lung. Further evaluation with nonemergent contrast-enhanced chest CT is recommended for further evaluation. Electronically Signed   By: Sandi Mariscal M.D.   On: 02/11/2016 17:50   Ct Head Wo Contrast  02/11/2016  CLINICAL DATA:  82 year old who tripped and fell onto his right side. Patient currently anticoagulated for prior history of pulmonary embolism. EXAM: CT HEAD WITHOUT CONTRAST TECHNIQUE: Contiguous axial images were obtained from the base of the skull through the vertex without intravenous contrast. COMPARISON:  None. FINDINGS: Moderate age related cortical and deep atrophy. Mild changes of small vessel disease of the white matter. Partially calcified likely extra-axial mass arising from the right temporal bone measuring maximally approximately 1.5 x 4.0 cm, with mass effect on the right temporal lobe and edema in the white matter of the right temporal lobe. As a result, the temporal horn of the right lateral ventricle is likely entrapped. No midline shift. No acute hemorrhage or hematoma. No skull fracture or other focal osseous abnormality involving the skull. Visualized paranasal sinuses, bilateral mastoid air cells and bilateral middle ear cavities well-aerated. Severe bilateral carotid siphon and vertebral artery atherosclerosis. IMPRESSION: 1. Partially calcified likely meningioma arising from the right temporal bone with maximum measurements approximating 1.5 x 4.0 cm. The likely meningioma causes mass effect upon the right temporal lobe with edema in the white matter and likely entrapment of the temporal horn of the right lateral ventricle. 2. No acute intracranial abnormality. 3. Moderate age related cortical and deep atrophy and mild chronic microvascular ischemic changes of the white matter. A non-emergent MRI of the brain may be helpful in further evaluation to confirm the likely dural origin of the  right temporal mass. Electronically Signed   By: Evangeline Dakin M.D.   On: 02/11/2016 17:12   Ct Hip Right Wo Contrast  02/11/2016  CLINICAL DATA:  Low back pain and right hip pain secondary to a fall. The patient is on blood thinner. History of osteosarcoma of the right hip. EXAM: CT OF THE RIGHT HIP WITHOUT CONTRAST TECHNIQUE: Multidetector CT imaging of the right hip was performed according to the standard protocol. Multiplanar CT image reconstructions were also generated. COMPARISON:  CT scan dated 02/11/2016 and radiographs dated 05/03 02/11/2016 and bone scan dated 09/01/2010 FINDINGS: Right hip prosthesis  is in excellent position with no evidence of loosening. There is no fracture or other acute abnormality. There is slight atypical osteopenia of the thickened right femoral shaft, possibly due to prior radiation treatment. Degenerative cyst formation in the roof of the right acetabulum. Evidence of lower lumbar fusion. Atypical patchy sclerosis of the ileum at the right SI joint. This is not felt to be significant. IMPRESSION: No acute abnormality of the right hip. Electronically Signed   By: Lorriane Shire M.D.   On: 02/11/2016 18:23   Dg Hip Unilat  With Pelvis 2-3 Views Right  02/11/2016  CLINICAL DATA:  Fall, hip pain EXAM: DG HIP (WITH OR WITHOUT PELVIS) 2-3V RIGHT COMPARISON:  None. FINDINGS: Three views of the right hip submitted. No acute fracture or subluxation. There is diffuse osteopenia. Right hip prosthesis with anatomic alignment. No evidence of prosthesis loosening. IMPRESSION: Diffuse osteopenia. No acute fracture or subluxation. Right hip prosthesis with anatomic alignment. Electronically Signed   By: Lahoma Crocker M.D.   On: 02/11/2016 14:11    EKG: Independently reviewed.  Assessment/Plan Principal Problem:   Acute respiratory failure with hypoxia (HCC) Active Problems:   Atrial fibrillation (HCC)   UTI (lower urinary tract infection)   Compression fracture of L1 lumbar  vertebra (HCC)   Acute resp failure with hypoxia -  O2 via [place]  Obtaining CT chest given the apparent 3.2 cm nodule / mass   Compression fx L1  Pain control  PT/OT  A.Fib - continue coumadin  UTI - rocephin, cultures pending     DVT prophylaxis: Continue coumadin Code Status: Full Family Communication: No family in room Consults called: None Admission status: Admit to inpatient   Etta Quill DO [facility] Pager [phone] from 7PM-7AM  If 7AM-7PM, please contact the day physician for the patient [email or web address] Password [email or web address]  02/11/2016, 9:59 PM

## 2016-02-11 NOTE — ED Notes (Signed)
Pt tripped and fell and landed on R side,c/o R hip pain. Pt had partial hip replacement on R hip from cancer. Pt in NAD, denies hiting head, denies LOC.

## 2016-02-11 NOTE — ED Notes (Signed)
Medication to be administered when pt returns from CT

## 2016-02-11 NOTE — ED Notes (Signed)
Pt. Able to stand and transfer to bed. Pt. States R leg is weak and painful to move.

## 2016-02-11 NOTE — ED Notes (Signed)
Glasses sent home with wife

## 2016-02-11 NOTE — ED Notes (Signed)
Pt wasn't able to complete his walk and his O2 stats were between 70-80%. RN is aware.

## 2016-02-11 NOTE — Progress Notes (Signed)
ANTICOAGULATION CONSULT NOTE - Initial Consult  Pharmacy Consult for warfarin Indication: DVT  No Known Allergies  Patient Measurements:    Vital Signs: Temp: 98.6 F (37 C) (05/03 1322) BP: 101/62 mmHg (05/03 2130) Pulse Rate: 37 (05/03 2100)  Labs:  Recent Labs  02/11/16 1614 02/11/16 1625  HGB 11.8* 13.3  HCT 36.5* 39.0  PLT 139*  --   LABPROT 30.0*  --   INR 2.92*  --   CREATININE 1.96* 1.80*    CrCl cannot be calculated (Unknown ideal weight.).  Assessment: 82 yo male presenting s/p fall x 2 days  PMH: Hx of DVT on warfarin, osteopenia, r hip osteosarcoma  AC: warfarin pta for hx of dvt. Admit INR 2.92  PTA warfarin 5 mg MF, 2.5 mg AOD  Renal: SCr 1.8  Heme: H&H 13.3/39, Plt 139  Goal of Therapy:  INR 2-3 Monitor platelets by anticoagulation protocol: Yes   Plan:  Warfarin 2.5 mg x 1 Daily INR, CBC q72h Monitor s/sx of bleeding  Levester Fresh, PharmD, BCPS, [facility] Clinical Pharmacist Pager [phone] 02/11/2016 9:47 PM

## 2016-02-11 NOTE — ED Notes (Signed)
Attending resident gave verbal order to allow pt to eat

## 2016-02-11 NOTE — ED Provider Notes (Signed)
CSN: [id number]     Arrival date & time 02/11/16  1314 History   First MD Initiated Contact with Patient 02/11/16 1530     Chief Complaint  Patient presents with  . Fall  . Hip Pain     (Consider location/radiation/quality/duration/timing/severity/associated sxs/prior Treatment) The history is provided by the spouse and medical records.   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 Pt states he tripped on a rug in the bathroom 2 days ago. He fell onto his right chest and hip with immediate onset of severe, 8/10, right hip pain. He was unable to get up and called his son, who brought him to his bed. Since then, he has been unable to ambulate due to severe R hip pain. Pain is constant, aching, worse with movement or weight bearing. Denies any LOC prior to or after event and denies any known head injury. No chest pain or palpitations. No hematuria. No abdominal pain, nausea, vomiting, or diarrhea.  Past Medical History  Diagnosis Date  . Pulmonary embolism ([place])     05/2011  . Hypotension   . Benign prostatic hypertrophy   . [place] filter in place     History of chronic lower extremity clots  . Deep vein thrombosis (HCC)     Coumadin anticoagulation for deep vein thrombosis  . Hyperlipidemia   . Hypertension      History of hypertension  . Lightheadedness   . Premature ventricular contractions   . Hand arthritis   . Coronary artery disease     s/p 3.0x23 cypher mid rca 2004  . Osteosarcoma ([place]) 2001    of the right hip  . Bilateral cataracts   . Atrial fibrillation ([place]) 12/11/2015  . CKD (chronic kidney disease) stage 3, GFR 30-59 ml/min    Past Surgical History  Procedure Laterality Date  . Stomach surgery  1972    for a hernia  . Rotator cuff repair  1985    right rotator cuff surgery  . Hip surgery  2001-2001    right hip surgery  . Cardiac catheterization  10/22/2002     left heart cath  . Excison toenail Right     HALLUX   Family History  Problem Relation Age of Onset  . Tuberculosis Mother    Social History  Substance Use Topics  . Smoking status: Former Research scientist (life sciences)  . Smokeless tobacco: Former Systems developer    Quit date: 10/20/1983  . Alcohol Use: Yes     Comment: very little    Review of Systems  Constitutional: Negative for fever, chills and fatigue.  HENT: Negative for congestion and rhinorrhea.   Eyes: Negative for visual disturbance.  Respiratory: Negative for cough, shortness of breath and wheezing.   Cardiovascular: Negative for chest pain and leg swelling.  Gastrointestinal: Negative for nausea, vomiting, abdominal pain and diarrhea.  Genitourinary: Positive for dysuria and frequency. Negative for flank pain.  Musculoskeletal: Positive for gait problem. Negative for neck pain and neck stiffness.  Skin: Negative for rash.  Allergic/Immunologic: Negative for immunocompromised state.  Neurological: Negative for syncope and headaches.  Hematological: Bruises/bleeds easily.      Allergies  Review of patient's allergies indicates no known allergies.  Home Medications   Prior to Admission medications   Medication Sig Start Date End Date Taking? Authorizing Provider  calcium-vitamin D (OSCAL WITH D) 250-125 MG-UNIT per tablet Take 1 tablet by mouth  daily.   Yes Historical Provider, MD  carvedilol (COREG) 3.125 MG tablet Take 1 tablet (3.125 mg total) by mouth 2 (two) times daily. 01/26/16  Yes Peter M Martinique, MD  finasteride (PROSCAR) 5 MG tablet Take 5 mg by mouth daily. 12/26/15  Yes Historical Provider, MD  fish oil-omega-3 fatty acids 1000 MG capsule Take 2 g by mouth daily.   Yes Historical Provider, MD  lisinopril (PRINIVIL,ZESTRIL) 5 MG tablet Take 1 tablet (5 mg total) by mouth daily. 12/15/15  Yes Peter M Martinique, MD  Multiple Vitamin (MULTIVITAMIN) tablet Take 1 tablet by mouth daily.   Yes Historical Provider, MD  pravastatin (PRAVACHOL) 40  MG tablet Take 40 mg by mouth daily.   Yes Historical Provider, MD  Tamsulosin HCl (FLOMAX) 0.4 MG CAPS Take 0.4 mg by mouth daily.   Yes Historical Provider, MD  vitamin D, CHOLECALCIFEROL, 400 UNITS tablet Take 400 Units by mouth daily.   Yes Historical Provider, MD  warfarin (COUMADIN) 5 MG tablet Take 2.5-5 mg by mouth daily. Take 2.5 mg Sun / Tues / Wed / Thurs / Sat Take 5 mg on Mon / Fri   Yes Historical Provider, MD   BP 116/69 mmHg  Pulse 113  Temp(Src) 98.2 F (36.8 C)  Resp 20  SpO2 99% Physical Exam  Constitutional: He is oriented to person, place, and time. He appears well-developed and well-nourished. No distress.  HENT:  Head: Normocephalic and atraumatic.  Mouth/Throat: No oropharyngeal exudate.  No apparent trauma to the head or neck  Eyes: Conjunctivae are normal. Pupils are equal, round, and reactive to light.  Neck: Normal range of motion. Neck supple.  No midline or paraspinal tenderness to palpation throughout the neck  Cardiovascular: Normal rate, regular rhythm, normal heart sounds and intact distal pulses.  Exam reveals no friction rub.   No murmur heard. Pulmonary/Chest: Effort normal and breath sounds normal. No respiratory distress. He has no wheezes. He has no rales.  Abdominal: Soft. He exhibits no distension. There is no tenderness.  Musculoskeletal: He exhibits no edema.  Neurological: He is alert and oriented to person, place, and time.  Skin: Skin is warm.  Nursing note and vitals reviewed.   LOWER EXTREMITY EXAM: RIGHT  INSPECTION & PALPATION: Post-operative sites well-healed, intact TTP over right SI joint and posterior iliac crest TTP over greater trochanter ROM with mild diffuse pain, worse with hip abduction No bruising or deformity  SENSORY: sensation is intact to light touch in:  - Superficial peroneal nerve distribution (over dorsum of foot) - Deep peroneal nerve distribution (over first dorsal web space) - Sural nerve distribution  (over lateral aspect 5th metatarsal) - Saphenous nerve distribution (over medial instep)  MOTOR:  + Motor EHL (great toe dorsiflexion) + FHL (great toe plantar flexion)  + TA (ankle dorsiflexion)  + GSC (ankle plantar flexion)  VASCULAR: - 2+ dorsalis pedis and posterior tibialis pulses - Capillary refill < 2 sec, toes warm and well-perfused  COMPARTMENTS: - Soft, warm, well-perfused - No pain with passive extension - No parethesias    ED Course  Procedures (including critical care time) Labs Review Labs Reviewed  CBC WITH DIFFERENTIAL/PLATELET - Abnormal; Notable for the following:    RBC 3.91 (*)    Hemoglobin 11.8 (*)    HCT 36.5 (*)    RDW 15.6 (*)    Platelets 139 (*)    All other components within normal limits  COMPREHENSIVE METABOLIC PANEL - Abnormal; Notable for the following:  CO2 20 (*)    Glucose, Bld 118 (*)    BUN 37 (*)    Creatinine, Ser 1.96 (*)    Albumin 3.3 (*)    ALT 15 (*)    GFR calc non Af Amer 29 (*)    GFR calc Af Amer 33 (*)    All other components within normal limits  URINALYSIS, ROUTINE W REFLEX MICROSCOPIC (NOT AT [facility]) - Abnormal; Notable for the following:    APPearance CLOUDY (*)    Hgb urine dipstick SMALL (*)    Ketones, ur 15 (*)    Leukocytes, UA LARGE (*)    All other components within normal limits  PROTIME-INR - Abnormal; Notable for the following:    Prothrombin Time 30.0 (*)    INR 2.92 (*)    All other components within normal limits  URINE MICROSCOPIC-ADD ON - Abnormal; Notable for the following:    Squamous Epithelial / LPF 0-5 (*)    Bacteria, UA MANY (*)    All other components within normal limits  I-STAT CHEM 8, ED - Abnormal; Notable for the following:    BUN 37 (*)    Creatinine, Ser 1.80 (*)    Glucose, Bld 117 (*)    All other components within normal limits  URINE CULTURE  PROTIME-INR  CBC  BASIC METABOLIC PANEL    Imaging Review Ct Abdomen Pelvis Wo Contrast  02/11/2016  CLINICAL DATA:   Tripped and fell on right side, history of hip replacement, low back pain EXAM: CT ABDOMEN AND PELVIS WITHOUT CONTRAST TECHNIQUE: Multidetector CT imaging of the abdomen and pelvis was performed following the standard protocol without IV contrast. COMPARISON:  None. FINDINGS: Lower chest: Images of the lung bases shows bilateral emphysematous changes. Peripheral fibrotic changes and honeycombing in noted. Atherosclerotic calcifications of coronary arteries. There is moderate size hiatal hernia measures at least 6.8 cm. Hepatobiliary: Unenhanced liver shows no biliary ductal dilatation. No perihepatic ascites. Calcified gallstone within gallbladder measures 1.4 cm. Pancreas: Unenhanced pancreas is mild atrophic. Spleen: Unenhanced spleen is unremarkable. Adrenals/Urinary Tract: No adrenal gland mass. Unenhanced kidneys are atrophic with cortical thinning. No nephrolithiasis. No hydronephrosis or hydroureter. No calcified ureteral calculi are noted. Stomach/Bowel: Study is limited without IV or oral contrast. No gastric outlet obstruction. No small bowel obstruction. No thickened or dilated small bowel loops are noted. There is no pericecal inflammation. The terminal ileum is unremarkable. Vascular/Lymphatic: IVC filter in place is noted. Extensive atherosclerotic calcifications of abdominal aorta, SMA, splenic artery and bilateral common iliac arteries. There is no aortic aneurysm. Reproductive: Limited evaluation of the pelvis due to extensive metallic artifacts from right hip prosthesis. The visualized prostate gland is unremarkable. Other: No ascites or free air. Musculoskeletal: There is diffuse osteopenia. Multilevel degenerative changes are noted thoracolumbar spine. Posterior metallic fusion noted at S9-G2 level. There is disc space flattening with vacuum disc phenomenon mild anterior and mild posterior spurring at L2-L3 level. Axial image 26 there is mild compression fracture upper endplate of L1 vertebral  body. Best seen in sagittal image 63. This is highly suspicious for acute or subacute compression fracture. There is no bony protrusion in spinal canal at this level. No spinal canal stenosis. No paraspinal hematoma. There is right hip prosthesis with anatomic alignment. No gross fracture or subluxation. No sacral insufficiency fracture is identified. Degenerative changes are noted right SI joint. IMPRESSION: 1. Emphysematous and fibrotic changes are noted lung bases. 2. Calcified gallstone within gallbladder measures 1.4 cm. 3. Extensive atherosclerotic  vascular calcifications are noted. 4. Bilateral atrophic kidneys without hydronephrosis or hydroureter. No calcified ureteral calculi. 5. IVC filter in place. 6. Limited evaluation of the pelvis due to extensive metallic artifacts from right hip prosthesis. No gross acute fractures are identified within pelvis. 7. There is disc space flattening with vacuum disc phenomenon mild anterior and mild posterior spurring at L2-L3 level. Axial image 26 there is mild compression fracture upper endplate of L1 vertebral body. Best seen in sagittal image 63. This is highly suspicious for acute or subacute compression fracture. There is no bony protrusion in spinal canal at this level. No spinal canal stenosis. No paraspinal hematoma. 8. Posterior metallic fusion lumbar spine L5-S1 level. No sacral insufficiency fracture is identified. Electronically Signed   By: Lahoma Crocker M.D.   On: 02/11/2016 17:23   Dg Chest 1 View  02/11/2016  CLINICAL DATA:  Post fall yesterday, now with severe back pain. EXAM: CHEST 1 VIEW COMPARISON:  05/23/2011; CT abdomen pelvis- 02/11/2016 FINDINGS: Enlarged cardiac silhouette and mediastinal contours, progressed since the 05/2011 examination. Advanced emphysematous change with thinning of the biapical pulmonary parenchymal, left greater than right, and diffuse slightly nodular thickening of the pulmonary interstitium. Interval development of a  approximately 3.2 x 2.5 cm nodular opacity/mass within the peripheral aspect of the right upper lung. Slight worsening of left basilar / retrocardiac opacities, likely atelectasis. No definite pleural effusion or pneumothorax. No definite evidence of edema. No definite acute osseous abnormalities. IMPRESSION: 1. Advanced emphysematous change without definitive superimposed acute cardiopulmonary disease. 2. Interval development of an apparent 3.2 cm nodule/mass within the peripheral aspect of the right upper lung. Further evaluation with nonemergent contrast-enhanced chest CT is recommended for further evaluation. Electronically Signed   By: Sandi Mariscal M.D.   On: 02/11/2016 17:50   Ct Head Wo Contrast  02/11/2016  CLINICAL DATA:  82 year old who tripped and fell onto his right side. Patient currently anticoagulated for prior history of pulmonary embolism. EXAM: CT HEAD WITHOUT CONTRAST TECHNIQUE: Contiguous axial images were obtained from the base of the skull through the vertex without intravenous contrast. COMPARISON:  None. FINDINGS: Moderate age related cortical and deep atrophy. Mild changes of small vessel disease of the white matter. Partially calcified likely extra-axial mass arising from the right temporal bone measuring maximally approximately 1.5 x 4.0 cm, with mass effect on the right temporal lobe and edema in the white matter of the right temporal lobe. As a result, the temporal horn of the right lateral ventricle is likely entrapped. No midline shift. No acute hemorrhage or hematoma. No skull fracture or other focal osseous abnormality involving the skull. Visualized paranasal sinuses, bilateral mastoid air cells and bilateral middle ear cavities well-aerated. Severe bilateral carotid siphon and vertebral artery atherosclerosis. IMPRESSION: 1. Partially calcified likely meningioma arising from the right temporal bone with maximum measurements approximating 1.5 x 4.0 cm. The likely meningioma causes  mass effect upon the right temporal lobe with edema in the white matter and likely entrapment of the temporal horn of the right lateral ventricle. 2. No acute intracranial abnormality. 3. Moderate age related cortical and deep atrophy and mild chronic microvascular ischemic changes of the white matter. A non-emergent MRI of the brain may be helpful in further evaluation to confirm the likely dural origin of the right temporal mass. Electronically Signed   By: Evangeline Dakin M.D.   On: 02/11/2016 17:12   Ct Chest Wo Contrast  02/11/2016  CLINICAL DATA:  Post trip and fall now with  right-sided chest pain. EXAM: CT CHEST WITHOUT CONTRAST TECHNIQUE: Multidetector CT imaging of the chest was performed following the standard protocol without IV contrast. COMPARISON:  Chest radiograph - earlier same day FINDINGS: Opacity seen on preceding chest radiograph correlates with an approximately 2.8 x 3.0 cm macro lobular mass (image 52, series 205) which is noted to be located within the nondependent portion of a large bleb within the right upper lobe. Severe paraseptal emphysematous change.  No pneumothorax. Rather extensive reticular opacities within the bilateral lung bases then about the bilateral costophrenic angles without definitive honeycombing. No focal airspace opacities. No pleural effusion or pneumothorax. The central pulmonary airways remain patent. No additional discrete pulmonary nodules given extensive background parenchymal abnormalities. Right hilar adenopathy with index right suprahilar lymph node measuring 1.3 cm in greatest short axis diameter (image 59, series 201). Additional scattered mediastinal lymph nodes are numerous though individually not enlarged by size criteria with index precarinal lymph node measuring 0.9 cm in greatest short axis diameter (50, series 201). No axillary lymphadenopathy. Cardiomegaly. Small amount of pericardial fluid, presumably physiologic. Coronary artery calcifications.  Calcifications within the aortic annulus and suspected within the aortic leaflets. Scattered atherosclerotic plaque with a normal caliber thoracic aorta. Limited noncontrast evaluation of the upper abdomen demonstrates the laminated gallstone within the gallbladder. The bilateral kidneys appear atrophic. No acute or aggressive osseous abnormalities. Serpiginous lucency involving the superior endplate of the L1 vertebral body without associated fracture line or vertebral body height loss. Regional soft tissues appear normal. Normal noncontrast appearance of the thyroid gland. IMPRESSION: 1. Opacity seen on preceding chest radiograph correlates with a approximately 3 cm macular lobular mass within the right upper lobe located within the nondependent portion of a large bleb. Differential considerations include a mycetoma (favored) though malignancy is not excluded on the basis of this examination. Further evaluation with PET scan could be performed as indicated. 2. Solitary right hilar lymph node nonspecific and while potentially reactive given advanced parenchymal abnormalities, attention on follow-up is recommended. 3. Advanced paraseptal emphysematous change. 4. Rather extensive reticular opacities within the bilateral lung bases and costophrenic angles without evidence of honeycombing. 5. Cardiomegaly. Atherosclerosis including coronary artery calcifications. 6. Cholelithiasis. 7. Serpiginous lucency involving the superior endplate of the L1 vertebral body without associated fracture line or vertebral body height loss is nonspecific though a potential developing acute compression fracture could have this appearance. Correlation for tenderness at this location is recommended. Further evaluation with MRI could be performed as indicated. Electronically Signed   By: Sandi Mariscal M.D.   On: 02/11/2016 22:35   Ct Hip Right Wo Contrast  02/11/2016  CLINICAL DATA:  Low back pain and right hip pain secondary to a fall. The  patient is on blood thinner. History of osteosarcoma of the right hip. EXAM: CT OF THE RIGHT HIP WITHOUT CONTRAST TECHNIQUE: Multidetector CT imaging of the right hip was performed according to the standard protocol. Multiplanar CT image reconstructions were also generated. COMPARISON:  CT scan dated 02/11/2016 and radiographs dated 05/03 02/11/2016 and bone scan dated 09/01/2010 FINDINGS: Right hip prosthesis is in excellent position with no evidence of loosening. There is no fracture or other acute abnormality. There is slight atypical osteopenia of the thickened right femoral shaft, possibly due to prior radiation treatment. Degenerative cyst formation in the roof of the right acetabulum. Evidence of lower lumbar fusion. Atypical patchy sclerosis of the ileum at the right SI joint. This is not felt to be significant. IMPRESSION: No acute abnormality of  the right hip. Electronically Signed   By: Lorriane Shire M.D.   On: 02/11/2016 18:23   Dg Hip Unilat  With Pelvis 2-3 Views Right  02/11/2016  CLINICAL DATA:  Fall, hip pain EXAM: DG HIP (WITH OR WITHOUT PELVIS) 2-3V RIGHT COMPARISON:  None. FINDINGS: Three views of the right hip submitted. No acute fracture or subluxation. There is diffuse osteopenia. Right hip prosthesis with anatomic alignment. No evidence of prosthesis loosening. IMPRESSION: Diffuse osteopenia. No acute fracture or subluxation. Right hip prosthesis with anatomic alignment. Electronically Signed   By: Lahoma Crocker M.D.   On: 02/11/2016 14:11   I have personally reviewed and evaluated these images and lab results as part of my medical decision-making.   EKG Interpretation   Date/Time:  Wednesday Feb 11 2016 18:02:51 EDT Ventricular Rate:  98 PR Interval:    QRS Duration: 90 QT Interval:  377 QTC Calculation: 481 R Axis:   71 Text Interpretation:  Atrial fibrillation Ventricular bigeminy Borderline  prolonged QT interval Abnormal ekg Confirmed by Carmin Muskrat  MD  [phone]) on  02/11/2016 6:11:11 PM      MDM  82 yo M with PMHx of HTN, HLD, DVT/PE on coumadin, AFib, h/o R hip osteosarcoma s/p chemoradiation who p/w right hip and lower back pain s/p fall from standing 2 days ago. On arrival, pt AF and VSS and WNL. Exam is as above. XR obtained in triage shows no acute fx of hip. Regarding his hip and back pain, c/f occult hip, pelvis, versus lower lumbar fx. No signs of NV compromise. Pt does appear to be having significant pain and I am concerned about safety at home due to falls risk, pain. Will obtain CT hip, also A/P given c/f pelvis fx, fall, and coumadin use with back pain with risk of RP hematoma. Otherwise, will obtain screening CXR, CT Head given fall. Regarding his dysuria and frequency, c/f UTI. Will check UA. No fever, flank pain, or signs of pyelo.  CT hip shows no fx. CT A/P c/f acute L1 fx, with no height loss or retropulsion. Labs o/w as above, c/w UTI without significant leukocytosis or signs of severe sepsis/systemic infection. While in ED, pt noted to be intermittently splinting due to pain with desaturations to 77% on RA while ambulating. Will treat UTI, give IV analgesia, and admit for pain control and hypoxia likely 2/2 pain from L1 compression fx. Doubt PE given therapeutic INR. No signs of PNA on CXR. No evidence of PTX. Of note, mass seen on CXR - will obtain CT for further contrast. Rocephin given for UTI.  Clinical Impression: 1. Compression fracture of L1 lumbar vertebra, closed, initial encounter ([place])   2. Fall   3. Acute respiratory failure with hypoxia ([facility])   4. UTI (lower urinary tract infection)   5. Chronic systolic CHF (congestive heart failure) ([place])     Disposition: Admit  Condition: Stable  Pt seen in conjunction with Dr. Dillard Essex, MD 02/12/16 0121  Carmin Muskrat, MD 02/13/16 [phone]

## 2016-02-11 NOTE — ED Notes (Signed)
Brought patient back to room via wheelchair with family in tow; patient undressed,  In gown, on continuous pulse oximetry and blood pressure cuff; wife at bedside

## 2016-02-12 DIAGNOSIS — Z66 Do not resuscitate: Secondary | ICD-10-CM

## 2016-02-12 DIAGNOSIS — R531 Weakness: Secondary | ICD-10-CM

## 2016-02-12 DIAGNOSIS — R918 Other nonspecific abnormal finding of lung field: Secondary | ICD-10-CM | POA: Insufficient documentation

## 2016-02-12 DIAGNOSIS — J9601 Acute respiratory failure with hypoxia: Principal | ICD-10-CM

## 2016-02-12 DIAGNOSIS — I481 Persistent atrial fibrillation: Secondary | ICD-10-CM

## 2016-02-12 DIAGNOSIS — Z515 Encounter for palliative care: Secondary | ICD-10-CM

## 2016-02-12 LAB — BASIC METABOLIC PANEL
ANION GAP: 10 (ref 5–15)
BUN: 37 mg/dL — ABNORMAL HIGH (ref 6–20)
CALCIUM: 8.3 mg/dL — AB (ref 8.9–10.3)
CO2: 18 mmol/L — ABNORMAL LOW (ref 22–32)
CREATININE: 1.75 mg/dL — AB (ref 0.61–1.24)
Chloride: 110 mmol/L (ref 101–111)
GFR, EST AFRICAN AMERICAN: 38 mL/min — AB (ref >60)
GFR, EST NON AFRICAN AMERICAN: 33 mL/min — AB (ref >60)
GLUCOSE: 113 mg/dL — AB (ref 65–99)
Potassium: 4.7 mmol/L (ref 3.5–5.1)
Sodium: 138 mmol/L (ref 135–145)

## 2016-02-12 LAB — CBC
HCT: 30.8 % — ABNORMAL LOW (ref 39.0–52.0)
Hemoglobin: 10 g/dL — ABNORMAL LOW (ref 13.0–17.0)
MCH: 30.8 pg (ref 26.0–34.0)
MCHC: 32.5 g/dL (ref 30.0–36.0)
MCV: 94.8 fL (ref 78.0–100.0)
PLATELETS: 114 10*3/uL — AB (ref 150–400)
RBC: 3.25 MIL/uL — ABNORMAL LOW (ref 4.22–5.81)
RDW: 15.5 % (ref 11.5–15.5)
WBC: 4 10*3/uL (ref 4.0–10.5)

## 2016-02-12 LAB — PROTIME-INR
INR: 3.94 — AB (ref 0.00–1.49)
Prothrombin Time: 37.6 seconds — ABNORMAL HIGH (ref 11.6–15.2)

## 2016-02-12 MED ORDER — FLUTICASONE FUROATE-VILANTEROL 200-25 MCG/INH IN AEPB
1.0000 | INHALATION_SPRAY | Freq: Every day | RESPIRATORY_TRACT | Status: DC
  Administered 2016-02-13: 1 via RESPIRATORY_TRACT
  Filled 2016-02-12: qty 28

## 2016-02-12 NOTE — Progress Notes (Signed)
ANTICOAGULATION CONSULT NOTE  Pharmacy Consult for warfarin Indication: DVT  No Known Allergies  Patient Measurements:    Vital Signs: Temp: 97.7 F (36.5 C) (05/04 0531) Temp Source: Oral (05/04 0531) BP: 97/59 mmHg (05/04 0834) Pulse Rate: 73 (05/04 0834)  Labs:  Recent Labs  02/11/16 1614 02/11/16 1625 02/12/16 0632  HGB 11.8* 13.3 10.0*  HCT 36.5* 39.0 30.8*  PLT 139*  --  114*  LABPROT 30.0*  --  37.6*  INR 2.92*  --  3.94*  CREATININE 1.96* 1.80* 1.75*    CrCl cannot be calculated (Unknown ideal weight.).  Assessment: 82 yo male presenting s/p fall x 2 days. Hx of DVT on warfarin, r hip osteosarcoma. Admit INR 2.92, now up to 3.94 after 2.'5mg'$  dose last night. CBC dropping, Hg down to 10, plt down to 114. No bleed documented. On ceftriaxone for UTI.  PTA warfarin 5 mg MF, 2.5 mg AOD (last dose pta 5/2)  Goal of Therapy:  INR 2-3 Monitor platelets by anticoagulation protocol: Yes   Plan:  Hold warfarin tonight Daily INR, monitor CBC trend Monitor s/sx of bleeding  Elicia Lamp, PharmD, [facility] Clinical Pharmacist Pager [phone] 02/12/2016 9:26 AM

## 2016-02-12 NOTE — Progress Notes (Signed)
SATURATION QUALIFICATIONS: (This note is used to comply with regulatory documentation for home oxygen)  Patient Saturations on Room Air at Rest = NT  Patient Saturations on Room Air while Ambulating = NT  Patient Saturations on 4 Liters of oxygen while Ambulating = 87%

## 2016-02-12 NOTE — Care Management Note (Addendum)
Case Management Note  Patient Details  Name: Darrell Mendoza MRN: [id number] Date of Birth: December 22, 1924  Subjective/Objective:             Spoke to patient and wife at bedside. They live in [place], son Hoover Browns and daughter in law Otila Kluver (who is a Marine scientist) live next door. Family spoke with palliative and wish for patient to return home. Patient will likely need oxygen at home, this wil lbe a new set up, discussed options and they would like [facility]. Patient has walker and cane and other than O2 does not have DME needs. Paient and wife would like to discuss with [facility] companies before choosing.   Action/Plan:  Will need HH RN and home oxygen set up.  Addendum 02/13/16 Wife chose [facility] for HH. Will DC today with HH PT OT RN through [facility]. Oxygen to be delivered to room prior to DC through [facility].   Expected Discharge Date:  02/13/16               Expected Discharge Plan:  [place]  In-House Referral:     Discharge planning Services  CM Consult  Post Acute Care Choice:    Choice offered to:  Patient, Spouse, Adult Children  DME Arranged:  Oxygen DME Agency:  [place]:    [place] Agency:     Status of Service:     Medicare Important Message Given:    Date Medicare IM Given:    Medicare IM give by:    Date Additional Medicare IM Given:    Additional Medicare Important Message give by:     If discussed at [place] of Stay Meetings, dates discussed:    Additional Comments:  Carles Collet, RN 02/12/2016, 2:37 PM

## 2016-02-12 NOTE — Progress Notes (Signed)
Nutrition Brief Note  Patient identified on the Malnutrition Screening Tool (MST) Report  Wt Readings from Last 15 Encounters:  01/26/16 170 lb (77.111 kg)  12/24/15 170 lb (77.111 kg)  12/11/15 170 lb (77.111 kg)  12/03/14 180 lb 6 oz (81.818 kg)  11/22/13 174 lb (78.926 kg)  09/25/13 170 lb (77.111 kg)  09/11/13 169 lb (76.658 kg)  11/13/12 179 lb (81.194 kg)  10/26/11 180 lb (81.647 kg)   82 y.o. 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 Patient presents to the ED with c/o back pain that radiates to R hip s/p fall. Patient tripped on a rug in the bathroom 2 days ago. Fell on to right chest and hip with immediate onset of severe pain. Was unable to get up, called son who brought patient to bed. Since then he has been unable to ambulate. Pain is constant, aching. Worse with movement or weight bearing.  Spoke with pt and pt wife at bedside. Both confirm pt has a good appetite. Pt reports he consumed all of his breakfast this morning and dinner last night. Appetite was good PTA; pt wife reveals pt typically consumes 3 meals and 1 snack daily ("he eats whatever I fix him").   Both deny any weight loss. Pt wife reports pt may have lost "3 pounds". UBW 170#, which has been consistent over the past 3 years per chart review.   Nutrition-Focused physical exam completed. Findings are mild fat depletion, mild muscle depletion, and no edema. Suspect fait and muscle depletion are related to advanced age and decline in mobility.  Estimated body mass index is 25.85 kg/(m^2) as calculated from the following:   Height as of 01/26/16: '5\' 8"'$  (1.727 m).   Weight as of 01/26/16: 170 lb (77.111 kg). Patient meets criteria for overweight based on current BMI.   Current diet order is Heart Healthy, patient is consuming approximately 100% of meals at this time. Labs and medications reviewed.   No nutrition  interventions warranted at this time. If nutrition issues arise, please consult RD.   [place] Rico A. Jimmye Norman, RD, LDN, CDE Pager: [phone] After hours Pager: [phone]

## 2016-02-12 NOTE — Evaluation (Signed)
Physical Therapy Evaluation Patient Details Name: Darrell Mendoza MRN: [id number] DOB: May 02, 1925 Today's Date: 02/12/2016   History of Present Illness  Darrell Mendoza is a 82 y.o. 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 Patient presents to the ED with c/o back pain that radiates to R hip s/p fall. Patient tripped on a rug in the bathroom 2 days ago. Fell on to right chest and hip with immediate onset of severe pain.Darrell Mendoza found to have L1 vertebral compression fx.  While in ER, Darrell Mendoza found to be hypoxic.  Clinical Impression  Darrell Mendoza admitted with above diagnosis. Darrell Mendoza currently with functional limitations due to the deficits listed below (see Darrell Mendoza Problem List).  Darrell Mendoza will benefit from skilled Darrell Mendoza to increase their independence and safety with mobility to allow discharge to the venue listed below.  Darrell Mendoza normally ambulates without AD and without o2.  Recommend HHPT to follow up.  Darrell Mendoza o2 levels dropped to 87% on 4 L/min with gait with RW.     Follow Up Recommendations Supervision - Intermittent;Home health Darrell Mendoza    Equipment Recommendations  None recommended by Darrell Mendoza    Recommendations for Other Services       Precautions / Restrictions Precautions Precautions: Fall Precaution Comments: L1 compression fx Restrictions Weight Bearing Restrictions: No      Mobility  Bed Mobility Overal bed mobility: Needs Assistance Bed Mobility: Rolling;Sidelying to Sit Rolling: Supervision Sidelying to sit: Min assist       General bed mobility comments: MIN A to gt trunk upright  Transfers Overall transfer level: Needs assistance Equipment used: Rolling walker (2 wheeled) Transfers: Sit to/from Stand Sit to Stand: From elevated surface;Min guard            Ambulation/Gait Ambulation/Gait assistance: Min guard Ambulation Distance (Feet): 126 Feet Assistive device: Rolling walker (2 wheeled) Gait  Pattern/deviations: Step-through pattern;Trunk flexed     General Gait Details: Amb on 4 L/o2 and no c/o SOB.  O2 87% at end of gait.  Educated on pursed lip breathing and increased to 92% after 2 minutes.  Stairs            Wheelchair Mobility    Modified Rankin (Stroke Patients Only)       Balance Overall balance assessment: Needs assistance;History of Falls Sitting-balance support: Feet supported Sitting balance-Leahy Scale: Good       Standing balance-Leahy Scale: Fair                               Pertinent Vitals/Pain Pain Assessment: 0-10 Pain Score: 3  Pain Location: mid low back Pain Descriptors / Indicators: Sore;Aching Pain Intervention(s): Monitored during session;Repositioned    Home Living Family/patient expects to be discharged to:: Private residence Living Arrangements: Spouse/significant other Available Help at Discharge: Available 24 hours/day;Family (son and daughter-in-law Investment banker, corporate) live next door) Type of Home: House Home Access: Stairs to enter Entrance Stairs-Rails: Right;Left;Can reach both Entrance Stairs-Number of Steps: 4 Home Layout: One level Home Equipment: Environmental consultant - 2 wheels;Cane - single point      Prior Function Level of Independence: Independent;Independent with assistive device(s)         Comments: Occasional ambulation with cane     Hand Dominance        Extremity/Trunk Assessment   Upper Extremity Assessment: Overall WFL for tasks assessed           Lower  Extremity Assessment: Overall WFL for tasks assessed         Communication   Communication: No difficulties  Cognition Arousal/Alertness: Awake/alert Behavior During Therapy: WFL for tasks assessed/performed Overall Cognitive Status: Within Functional Limits for tasks assessed       Memory: Decreased short-term memory              General Comments General comments (skin integrity, edema, etc.): Wife present at eval.    Exercises         Assessment/Plan    Darrell Mendoza Assessment Patient needs continued Darrell Mendoza services  Darrell Mendoza Diagnosis Difficulty walking   Darrell Mendoza Problem List Decreased strength;Decreased activity tolerance;Decreased balance;Decreased mobility;Decreased knowledge of use of DME  Darrell Mendoza Treatment Interventions Gait training;Stair training;Functional mobility training;Therapeutic activities;Therapeutic exercise;Balance training;Patient/family education   Darrell Mendoza Goals (Current goals can be found in the Care Plan section) Acute Rehab Darrell Mendoza Goals Patient Stated Goal: go home Darrell Mendoza Goal Formulation: With patient/family Time For Goal Achievement: 02/19/16 Potential to Achieve Goals: Good    Frequency Min 3X/week   Barriers to discharge        Co-evaluation               End of Session Equipment Utilized During Treatment: Gait belt;Oxygen Activity Tolerance: Treatment limited secondary to medical complications (Comment) (de-sat) Patient left: in chair;with call bell/phone within reach;with chair alarm set;with family/visitor present Nurse Communication: Mobility status;Other (comment) (o2 sats)         Time: 8889-1694 Darrell Mendoza Time Calculation (min) (ACUTE ONLY): 31 min   Charges:   Darrell Mendoza Evaluation $Darrell Mendoza Eval Moderate Complexity: 1 Procedure Darrell Mendoza Treatments $Gait Training: 8-22 mins   Darrell Mendoza G Codes:        Quenna Doepke LUBECK 02/12/2016, 10:32 AM

## 2016-02-12 NOTE — Discharge Instructions (Addendum)
Information on my medicine - Coumadin®   (Warfarin) ° °This medication education was reviewed with me or my healthcare representative as part of my discharge preparation. ° °Why was Coumadin prescribed for you? °Coumadin was prescribed for you because you have a blood clot or a medical condition that can cause an increased risk of forming blood clots. Blood clots can cause serious health problems by blocking the flow of blood to the heart, lung, or brain. Coumadin can prevent harmful blood clots from forming. °As a reminder your indication for Coumadin is:   Deep Vein Thrombosis Treatment ° °What test will check on my response to Coumadin? °While on Coumadin (warfarin) you will need to have an INR test regularly to ensure that your dose is keeping you in the desired range. The INR (international normalized ratio) number is calculated from the result of the laboratory test called prothrombin time (PT). ° °If an INR APPOINTMENT HAS NOT ALREADY BEEN MADE FOR YOU please schedule an appointment to have this lab work done by your health care provider within 7 days. °Your INR goal is usually a number between:  2 to 3 or your provider may give you a more narrow range like 2-2.5.  Ask your health care provider during an office visit what your goal INR is. ° °What  do you need to  know  About  COUMADIN? °Take Coumadin (warfarin) exactly as prescribed by your healthcare provider about the same time each day.  DO NOT stop taking without talking to the doctor who prescribed the medication.  Stopping without other blood clot prevention medication to take the place of Coumadin may increase your risk of developing a new clot or stroke.  Get refills before you run out. ° °What do you do if you miss a dose? °If you miss a dose, take it as soon as you remember on the same day then continue your regularly scheduled regimen the next day.  Do not take two doses of Coumadin at the same time. ° °Important Safety Information °A possible  side effect of Coumadin (Warfarin) is an increased risk of bleeding. You should call your healthcare provider right away if you experience any of the following: °? Bleeding from an injury or your nose that does not stop. °? Unusual colored urine (red or dark brown) or unusual colored stools (red or black). °? Unusual bruising for unknown reasons. °? A serious fall or if you hit your head (even if there is no bleeding). ° °Some foods or medicines interact with Coumadin® (warfarin) and might alter your response to warfarin. To help avoid this: °? Eat a balanced diet, maintaining a consistent amount of Vitamin K. °? Notify your provider about major diet changes you plan to make. °? Avoid alcohol or limit your intake to 1 drink for women and 2 drinks for men per day. °(1 drink is 5 oz. wine, 12 oz. beer, or 1.5 oz. liquor.) ° °Make sure that ANY health care provider who prescribes medication for you knows that you are taking Coumadin (warfarin).  Also make sure the healthcare provider who is monitoring your Coumadin knows when you have started a new medication including herbals and non-prescription products. ° °Coumadin® (Warfarin)  Major Drug Interactions  °Increased Warfarin Effect Decreased Warfarin Effect  °Alcohol (large quantities) °Antibiotics (esp. Septra/Bactrim, Flagyl, Cipro) °Amiodarone (Cordarone) °Aspirin (ASA) °Cimetidine (Tagamet) °Megestrol (Megace) °NSAIDs (ibuprofen, naproxen, etc.) °Piroxicam (Feldene) °Propafenone (Rythmol SR) °Propranolol (Inderal) °Isoniazid (INH) °Posaconazole (Noxafil) Barbiturates (Phenobarbital) °Carbamazepine (Tegretol) °Chlordiazepoxide (Librium) °  Cholestyramine (Questran) Griseofulvin Oral Contraceptives Rifampin Sucralfate (Carafate) Vitamin K   Coumadin (Warfarin) Major Herbal Interactions  Increased Warfarin Effect Decreased Warfarin Effect  Garlic Ginseng Ginkgo biloba Coenzyme Q10 Green tea St. Johns wort    Coumadin (Warfarin) FOOD Interactions  Eat  a consistent number of servings per week of foods HIGH in Vitamin K (1 serving =  cup)  Collards (cooked, or boiled & drained) Kale (cooked, or boiled & drained) Mustard greens (cooked, or boiled & drained) Parsley *serving size only =  cup Spinach (cooked, or boiled & drained) Swiss chard (cooked, or boiled & drained) Turnip greens (cooked, or boiled & drained)  Eat a consistent number of servings per week of foods MEDIUM-HIGH in Vitamin K (1 serving = 1 cup)  Asparagus (cooked, or boiled & drained) Broccoli (cooked, boiled & drained, or raw & chopped) Brussel sprouts (cooked, or boiled & drained) *serving size only =  cup Lettuce, raw (green leaf, endive, romaine) Spinach, raw Turnip greens, raw & chopped   These websites have more information on Coumadin (warfarin):  FailFactory.se; VeganReport.com.au;   Follow-up recommendations Follow-up with PCP in 3-5 days , including all  additional recommended appointments as below Follow-up CBC, CMP in 3-5 days Patient will need pulmonary evaluation as well as a PET CT scan for 3.2cm lung nodule / mass, patient to discuss this with PCP Patient supratherapeutic on the day of discharge and advised to hold Coumadin until Monday, needs to see primary care doctor on Monday for repeat INR prior to resuming Coumadin

## 2016-02-12 NOTE — Progress Notes (Signed)
Received pt from ED via stretcher, alert and oriented and denies any pain.  With movement, however, he winces. Put on tele, receiving 4L 02 via [place]. Bed alarm on.

## 2016-02-12 NOTE — Progress Notes (Addendum)
[facility] PROGRESS NOTE  Darrell Mendoza QHU:[id number] DOB: 29-Jan-1925 DOA: 02/11/2016   PCP: Haywood Pao, MD     Assessment/Plan: Principal Problem:   Acute respiratory failure with hypoxia ([place]) Active Problems:   Atrial fibrillation ([place])   UTI (lower urinary tract infection)   Compression fracture of L1 lumbar vertebra ([place])   82 y.o. 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 Patient presents to the ED with c/o back pain that radiates to R hip s/p fall. Patient tripped on a rug in the bathroom 2 days ago. Fell on to right chest and hip with immediate onset of severe pain. Was unable to get up, called son who brought patient to bed. Since then he has been unable to ambulate. Pain is constant, aching. Worse with movement or weight bearing.  ED Course: CT abd/pelvis and X ray of hip are negative for hip fracture. Does identify an L1 vertebral compression fracture without significant loss of hight, this does appear acute and patient has significant tenderness at this point on exam. He is also found to have a UTI, and a 3.2cm lung nodule / mass. Patient is able to ambulate with assistance in the ED; however, he has hypoxia to the low 80s when he does so and has to be put on O2 via [place].  Assessment and plan Acute resp failure with hypoxia - CT scan concerning for a 3 cm right upper lobe mass. Differential diagnosis mycetoma vs malignancy,PET  scan recommended as outpatient.  No pneumonia, currently requiring 4 L of oxygen, doubt pulmonary embolism as the patient is therapeutic on his INR. results of the CAT scan discussed with the patient and his wife, not on home oxygen , needs aggressive pulm toilet and IS and home oxygen , patient needs outpatient pulmonary evaluation due to severe emphysema and lung mass   Hypertension-hold ACE inhibitor given now  low blood pressure,  cont coreg  History of pulmonary embolism-patient is supratherapeutic on his INR, has IVC filter   Chronic kidney disease stage III, creatinine at baseline of 1.6  Chronic systolic congestive heart failure-2-D echo 12/15/15 showed an EF of   30-35% with pulmonary hypertension,Dr. Tisovec recommended outpatient  LEXI scan Myoview  Compression fx L1 Pain control PT/OT, probably not a candidate for vertebroplasty as the patient is on anticoagulation   A.Fib - continue coumadin  UTI - rocephin, follow urine culture    asymptomatic gallbladder stone- discussed CT abdomen and pelvis findings with the patient's wife and the patient, no intervention indicated at this time  DVT prophylaxsis  Coumadin   Code Status:   Full code    Family Communication: Discussed in detail with the patient, all imaging results, lab results explained to the patient   Disposition Plan:  anticpate DC in 1-2 days      Consultants:None  Procedures-none    Antibiotics: Rocephin 5/3-       HPI/Subjective: Patient Saturations on 4 Liters of oxygen while Ambulating = 87%, BP soft ,but appears comfortable   Objective: Filed Vitals:   02/11/16 2130 02/11/16 2314 02/12/16 0531 02/12/16 0834  BP: 101/62 116/69 103/56 97/59  Pulse:  113 90 73  Temp:  98.2 F (36.8 C) 97.7 F (36.5 C)   TempSrc:   Oral   Resp: '23 20 16   '$ SpO2:  99% 96%     Intake/Output Summary (Last 24 hours) at  02/12/16 0921 Last data filed at 02/12/16 0716  Gross per 24 hour  Intake      0 ml  Output    200 ml  Net   -200 ml    Exam:  Examination:  General exam: Appears calm and comfortable  Respiratory system: Clear to auscultation. Respiratory effort normal. Cardiovascular system: S1 & S2 heard, RRR. No JVD, murmurs, rubs, gallops or clicks. No pedal edema. Gastrointestinal system: Abdomen is nondistended, soft and nontender. No organomegaly or masses felt. Normal bowel sounds  heard. Central nervous system: Alert and oriented. No focal neurological deficits. Extremities: Symmetric 5 x 5 power. Skin: No rashes, lesions or ulcers Psychiatry: Judgement and insight appear normal. Mood & affect appropriate.     Data Reviewed: I have personally reviewed following labs and imaging studies  Micro Results No results found for this or any previous visit (from the past 240 hour(s)).  Radiology Reports Ct Abdomen Pelvis Wo Contrast  02/11/2016  CLINICAL DATA:  Tripped and fell on right side, history of hip replacement, low back pain EXAM: CT ABDOMEN AND PELVIS WITHOUT CONTRAST TECHNIQUE: Multidetector CT imaging of the abdomen and pelvis was performed following the standard protocol without IV contrast. COMPARISON:  None. FINDINGS: Lower chest: Images of the lung bases shows bilateral emphysematous changes. Peripheral fibrotic changes and honeycombing in noted. Atherosclerotic calcifications of coronary arteries. There is moderate size hiatal hernia measures at least 6.8 cm. Hepatobiliary: Unenhanced liver shows no biliary ductal dilatation. No perihepatic ascites. Calcified gallstone within gallbladder measures 1.4 cm. Pancreas: Unenhanced pancreas is mild atrophic. Spleen: Unenhanced spleen is unremarkable. Adrenals/Urinary Tract: No adrenal gland mass. Unenhanced kidneys are atrophic with cortical thinning. No nephrolithiasis. No hydronephrosis or hydroureter. No calcified ureteral calculi are noted. Stomach/Bowel: Study is limited without IV or oral contrast. No gastric outlet obstruction. No small bowel obstruction. No thickened or dilated small bowel loops are noted. There is no pericecal inflammation. The terminal ileum is unremarkable. Vascular/Lymphatic: IVC filter in place is noted. Extensive atherosclerotic calcifications of abdominal aorta, SMA, splenic artery and bilateral common iliac arteries. There is no aortic aneurysm. Reproductive: Limited evaluation of the pelvis due  to extensive metallic artifacts from right hip prosthesis. The visualized prostate gland is unremarkable. Other: No ascites or free air. Musculoskeletal: There is diffuse osteopenia. Multilevel degenerative changes are noted thoracolumbar spine. Posterior metallic fusion noted at C5-E5 level. There is disc space flattening with vacuum disc phenomenon mild anterior and mild posterior spurring at L2-L3 level. Axial image 26 there is mild compression fracture upper endplate of L1 vertebral body. Best seen in sagittal image 63. This is highly suspicious for acute or subacute compression fracture. There is no bony protrusion in spinal canal at this level. No spinal canal stenosis. No paraspinal hematoma. There is right hip prosthesis with anatomic alignment. No gross fracture or subluxation. No sacral insufficiency fracture is identified. Degenerative changes are noted right SI joint. IMPRESSION: 1. Emphysematous and fibrotic changes are noted lung bases. 2. Calcified gallstone within gallbladder measures 1.4 cm. 3. Extensive atherosclerotic vascular calcifications are noted. 4. Bilateral atrophic kidneys without hydronephrosis or hydroureter. No calcified ureteral calculi. 5. IVC filter in place. 6. Limited evaluation of the pelvis due to extensive metallic artifacts from right hip prosthesis. No gross acute fractures are identified within pelvis. 7. There is disc space flattening with vacuum disc phenomenon mild anterior and mild posterior spurring at L2-L3 level. Axial image 26 there is mild compression fracture upper endplate of L1 vertebral body. Best  seen in sagittal image 63. This is highly suspicious for acute or subacute compression fracture. There is no bony protrusion in spinal canal at this level. No spinal canal stenosis. No paraspinal hematoma. 8. Posterior metallic fusion lumbar spine L5-S1 level. No sacral insufficiency fracture is identified. Electronically Signed   By: Lahoma Crocker M.D.   On: 02/11/2016  17:23   Dg Chest 1 View  02/11/2016  CLINICAL DATA:  Post fall yesterday, now with severe back pain. EXAM: CHEST 1 VIEW COMPARISON:  05/23/2011; CT abdomen pelvis- 02/11/2016 FINDINGS: Enlarged cardiac silhouette and mediastinal contours, progressed since the 05/2011 examination. Advanced emphysematous change with thinning of the biapical pulmonary parenchymal, left greater than right, and diffuse slightly nodular thickening of the pulmonary interstitium. Interval development of a approximately 3.2 x 2.5 cm nodular opacity/mass within the peripheral aspect of the right upper lung. Slight worsening of left basilar / retrocardiac opacities, likely atelectasis. No definite pleural effusion or pneumothorax. No definite evidence of edema. No definite acute osseous abnormalities. IMPRESSION: 1. Advanced emphysematous change without definitive superimposed acute cardiopulmonary disease. 2. Interval development of an apparent 3.2 cm nodule/mass within the peripheral aspect of the right upper lung. Further evaluation with nonemergent contrast-enhanced chest CT is recommended for further evaluation. Electronically Signed   By: Sandi Mariscal M.D.   On: 02/11/2016 17:50   Ct Head Wo Contrast  02/11/2016  CLINICAL DATA:  82 year old who tripped and fell onto his right side. Patient currently anticoagulated for prior history of pulmonary embolism. EXAM: CT HEAD WITHOUT CONTRAST TECHNIQUE: Contiguous axial images were obtained from the base of the skull through the vertex without intravenous contrast. COMPARISON:  None. FINDINGS: Moderate age related cortical and deep atrophy. Mild changes of small vessel disease of the white matter. Partially calcified likely extra-axial mass arising from the right temporal bone measuring maximally approximately 1.5 x 4.0 cm, with mass effect on the right temporal lobe and edema in the white matter of the right temporal lobe. As a result, the temporal horn of the right lateral ventricle is  likely entrapped. No midline shift. No acute hemorrhage or hematoma. No skull fracture or other focal osseous abnormality involving the skull. Visualized paranasal sinuses, bilateral mastoid air cells and bilateral middle ear cavities well-aerated. Severe bilateral carotid siphon and vertebral artery atherosclerosis. IMPRESSION: 1. Partially calcified likely meningioma arising from the right temporal bone with maximum measurements approximating 1.5 x 4.0 cm. The likely meningioma causes mass effect upon the right temporal lobe with edema in the white matter and likely entrapment of the temporal horn of the right lateral ventricle. 2. No acute intracranial abnormality. 3. Moderate age related cortical and deep atrophy and mild chronic microvascular ischemic changes of the white matter. A non-emergent MRI of the brain may be helpful in further evaluation to confirm the likely dural origin of the right temporal mass. Electronically Signed   By: Evangeline Dakin M.D.   On: 02/11/2016 17:12   Ct Chest Wo Contrast  02/11/2016  CLINICAL DATA:  Post trip and fall now with right-sided chest pain. EXAM: CT CHEST WITHOUT CONTRAST TECHNIQUE: Multidetector CT imaging of the chest was performed following the standard protocol without IV contrast. COMPARISON:  Chest radiograph - earlier same day FINDINGS: Opacity seen on preceding chest radiograph correlates with an approximately 2.8 x 3.0 cm macro lobular mass (image 52, series 205) which is noted to be located within the nondependent portion of a large bleb within the right upper lobe. Severe paraseptal emphysematous change.  No pneumothorax. Rather extensive reticular opacities within the bilateral lung bases then about the bilateral costophrenic angles without definitive honeycombing. No focal airspace opacities. No pleural effusion or pneumothorax. The central pulmonary airways remain patent. No additional discrete pulmonary nodules given extensive background parenchymal  abnormalities. Right hilar adenopathy with index right suprahilar lymph node measuring 1.3 cm in greatest short axis diameter (image 59, series 201). Additional scattered mediastinal lymph nodes are numerous though individually not enlarged by size criteria with index precarinal lymph node measuring 0.9 cm in greatest short axis diameter (50, series 201). No axillary lymphadenopathy. Cardiomegaly. Small amount of pericardial fluid, presumably physiologic. Coronary artery calcifications. Calcifications within the aortic annulus and suspected within the aortic leaflets. Scattered atherosclerotic plaque with a normal caliber thoracic aorta. Limited noncontrast evaluation of the upper abdomen demonstrates the laminated gallstone within the gallbladder. The bilateral kidneys appear atrophic. No acute or aggressive osseous abnormalities. Serpiginous lucency involving the superior endplate of the L1 vertebral body without associated fracture line or vertebral body height loss. Regional soft tissues appear normal. Normal noncontrast appearance of the thyroid gland. IMPRESSION: 1. Opacity seen on preceding chest radiograph correlates with a approximately 3 cm macular lobular mass within the right upper lobe located within the nondependent portion of a large bleb. Differential considerations include a mycetoma (favored) though malignancy is not excluded on the basis of this examination. Further evaluation with PET scan could be performed as indicated. 2. Solitary right hilar lymph node nonspecific and while potentially reactive given advanced parenchymal abnormalities, attention on follow-up is recommended. 3. Advanced paraseptal emphysematous change. 4. Rather extensive reticular opacities within the bilateral lung bases and costophrenic angles without evidence of honeycombing. 5. Cardiomegaly. Atherosclerosis including coronary artery calcifications. 6. Cholelithiasis. 7. Serpiginous lucency involving the superior endplate  of the L1 vertebral body without associated fracture line or vertebral body height loss is nonspecific though a potential developing acute compression fracture could have this appearance. Correlation for tenderness at this location is recommended. Further evaluation with MRI could be performed as indicated. Electronically Signed   By: Sandi Mariscal M.D.   On: 02/11/2016 22:35   Ct Hip Right Wo Contrast  02/11/2016  CLINICAL DATA:  Low back pain and right hip pain secondary to a fall. The patient is on blood thinner. History of osteosarcoma of the right hip. EXAM: CT OF THE RIGHT HIP WITHOUT CONTRAST TECHNIQUE: Multidetector CT imaging of the right hip was performed according to the standard protocol. Multiplanar CT image reconstructions were also generated. COMPARISON:  CT scan dated 02/11/2016 and radiographs dated 05/03 02/11/2016 and bone scan dated 09/01/2010 FINDINGS: Right hip prosthesis is in excellent position with no evidence of loosening. There is no fracture or other acute abnormality. There is slight atypical osteopenia of the thickened right femoral shaft, possibly due to prior radiation treatment. Degenerative cyst formation in the roof of the right acetabulum. Evidence of lower lumbar fusion. Atypical patchy sclerosis of the ileum at the right SI joint. This is not felt to be significant. IMPRESSION: No acute abnormality of the right hip. Electronically Signed   By: Lorriane Shire M.D.   On: 02/11/2016 18:23   Dg Hip Unilat  With Pelvis 2-3 Views Right  02/11/2016  CLINICAL DATA:  Fall, hip pain EXAM: DG HIP (WITH OR WITHOUT PELVIS) 2-3V RIGHT COMPARISON:  None. FINDINGS: Three views of the right hip submitted. No acute fracture or subluxation. There is diffuse osteopenia. Right hip prosthesis with anatomic alignment. No evidence of prosthesis loosening. IMPRESSION:  Diffuse osteopenia. No acute fracture or subluxation. Right hip prosthesis with anatomic alignment. Electronically Signed   By: Lahoma Crocker M.D.   On: 02/11/2016 14:11     CBC  Recent Labs Lab 02/11/16 1614 02/11/16 1625 02/12/16 0632  WBC 5.0  --  4.0  HGB 11.8* 13.3 10.0*  HCT 36.5* 39.0 30.8*  PLT 139*  --  114*  MCV 93.4  --  94.8  MCH 30.2  --  30.8  MCHC 32.3  --  32.5  RDW 15.6*  --  15.5  LYMPHSABS 0.9  --   --   MONOABS 0.3  --   --   EOSABS 0.1  --   --   BASOSABS 0.0  --   --     Chemistries   Recent Labs Lab 02/11/16 1614 02/11/16 1625 02/12/16 0632  NA 138 139 138  K 5.1 4.9 4.7  CL 107 106 110  CO2 20*  --  18*  GLUCOSE 118* 117* 113*  BUN 37* 37* 37*  CREATININE 1.96* 1.80* 1.75*  CALCIUM 9.1  --  8.3*  AST 32  --   --   ALT 15*  --   --   ALKPHOS 64  --   --   BILITOT 0.9  --   --    ------------------------------------------------------------------------------------------------------------------ CrCl cannot be calculated (Unknown ideal weight.). ------------------------------------------------------------------------------------------------------------------ No results for input(s): HGBA1C in the last 72 hours. ------------------------------------------------------------------------------------------------------------------ No results for input(s): CHOL, HDL, LDLCALC, TRIG, CHOLHDL, LDLDIRECT in the last 72 hours. ------------------------------------------------------------------------------------------------------------------ No results for input(s): TSH, T4TOTAL, T3FREE, THYROIDAB in the last 72 hours.  Invalid input(s): FREET3 ------------------------------------------------------------------------------------------------------------------ No results for input(s): VITAMINB12, FOLATE, FERRITIN, TIBC, IRON, RETICCTPCT in the last 72 hours.  Coagulation profile  Recent Labs Lab 02/11/16 1614 02/12/16 0632  INR 2.92* 3.94*    No results for input(s): DDIMER in the last 72 hours.  Cardiac Enzymes No results for input(s): CKMB, TROPONINI, MYOGLOBIN in the last 168  hours.  Invalid input(s): CK ------------------------------------------------------------------------------------------------------------------ Invalid input(s): POCBNP   CBG: No results for input(s): GLUCAP in the last 168 hours.     Studies: Ct Abdomen Pelvis Wo Contrast  02/11/2016  CLINICAL DATA:  Tripped and fell on right side, history of hip replacement, low back pain EXAM: CT ABDOMEN AND PELVIS WITHOUT CONTRAST TECHNIQUE: Multidetector CT imaging of the abdomen and pelvis was performed following the standard protocol without IV contrast. COMPARISON:  None. FINDINGS: Lower chest: Images of the lung bases shows bilateral emphysematous changes. Peripheral fibrotic changes and honeycombing in noted. Atherosclerotic calcifications of coronary arteries. There is moderate size hiatal hernia measures at least 6.8 cm. Hepatobiliary: Unenhanced liver shows no biliary ductal dilatation. No perihepatic ascites. Calcified gallstone within gallbladder measures 1.4 cm. Pancreas: Unenhanced pancreas is mild atrophic. Spleen: Unenhanced spleen is unremarkable. Adrenals/Urinary Tract: No adrenal gland mass. Unenhanced kidneys are atrophic with cortical thinning. No nephrolithiasis. No hydronephrosis or hydroureter. No calcified ureteral calculi are noted. Stomach/Bowel: Study is limited without IV or oral contrast. No gastric outlet obstruction. No small bowel obstruction. No thickened or dilated small bowel loops are noted. There is no pericecal inflammation. The terminal ileum is unremarkable. Vascular/Lymphatic: IVC filter in place is noted. Extensive atherosclerotic calcifications of abdominal aorta, SMA, splenic artery and bilateral common iliac arteries. There is no aortic aneurysm. Reproductive: Limited evaluation of the pelvis due to extensive metallic artifacts from right hip prosthesis. The visualized prostate gland is unremarkable. Other: No ascites or free air. Musculoskeletal: There is  diffuse  osteopenia. Multilevel degenerative changes are noted thoracolumbar spine. Posterior metallic fusion noted at U1-L2 level. There is disc space flattening with vacuum disc phenomenon mild anterior and mild posterior spurring at L2-L3 level. Axial image 26 there is mild compression fracture upper endplate of L1 vertebral body. Best seen in sagittal image 63. This is highly suspicious for acute or subacute compression fracture. There is no bony protrusion in spinal canal at this level. No spinal canal stenosis. No paraspinal hematoma. There is right hip prosthesis with anatomic alignment. No gross fracture or subluxation. No sacral insufficiency fracture is identified. Degenerative changes are noted right SI joint. IMPRESSION: 1. Emphysematous and fibrotic changes are noted lung bases. 2. Calcified gallstone within gallbladder measures 1.4 cm. 3. Extensive atherosclerotic vascular calcifications are noted. 4. Bilateral atrophic kidneys without hydronephrosis or hydroureter. No calcified ureteral calculi. 5. IVC filter in place. 6. Limited evaluation of the pelvis due to extensive metallic artifacts from right hip prosthesis. No gross acute fractures are identified within pelvis. 7. There is disc space flattening with vacuum disc phenomenon mild anterior and mild posterior spurring at L2-L3 level. Axial image 26 there is mild compression fracture upper endplate of L1 vertebral body. Best seen in sagittal image 63. This is highly suspicious for acute or subacute compression fracture. There is no bony protrusion in spinal canal at this level. No spinal canal stenosis. No paraspinal hematoma. 8. Posterior metallic fusion lumbar spine L5-S1 level. No sacral insufficiency fracture is identified. Electronically Signed   By: Lahoma Crocker M.D.   On: 02/11/2016 17:23   Dg Chest 1 View  02/11/2016  CLINICAL DATA:  Post fall yesterday, now with severe back pain. EXAM: CHEST 1 VIEW COMPARISON:  05/23/2011; CT abdomen pelvis-  02/11/2016 FINDINGS: Enlarged cardiac silhouette and mediastinal contours, progressed since the 05/2011 examination. Advanced emphysematous change with thinning of the biapical pulmonary parenchymal, left greater than right, and diffuse slightly nodular thickening of the pulmonary interstitium. Interval development of a approximately 3.2 x 2.5 cm nodular opacity/mass within the peripheral aspect of the right upper lung. Slight worsening of left basilar / retrocardiac opacities, likely atelectasis. No definite pleural effusion or pneumothorax. No definite evidence of edema. No definite acute osseous abnormalities. IMPRESSION: 1. Advanced emphysematous change without definitive superimposed acute cardiopulmonary disease. 2. Interval development of an apparent 3.2 cm nodule/mass within the peripheral aspect of the right upper lung. Further evaluation with nonemergent contrast-enhanced chest CT is recommended for further evaluation. Electronically Signed   By: Sandi Mariscal M.D.   On: 02/11/2016 17:50   Ct Head Wo Contrast  02/11/2016  CLINICAL DATA:  82 year old who tripped and fell onto his right side. Patient currently anticoagulated for prior history of pulmonary embolism. EXAM: CT HEAD WITHOUT CONTRAST TECHNIQUE: Contiguous axial images were obtained from the base of the skull through the vertex without intravenous contrast. COMPARISON:  None. FINDINGS: Moderate age related cortical and deep atrophy. Mild changes of small vessel disease of the white matter. Partially calcified likely extra-axial mass arising from the right temporal bone measuring maximally approximately 1.5 x 4.0 cm, with mass effect on the right temporal lobe and edema in the white matter of the right temporal lobe. As a result, the temporal horn of the right lateral ventricle is likely entrapped. No midline shift. No acute hemorrhage or hematoma. No skull fracture or other focal osseous abnormality involving the skull. Visualized paranasal  sinuses, bilateral mastoid air cells and bilateral middle ear cavities well-aerated. Severe bilateral carotid siphon and  vertebral artery atherosclerosis. IMPRESSION: 1. Partially calcified likely meningioma arising from the right temporal bone with maximum measurements approximating 1.5 x 4.0 cm. The likely meningioma causes mass effect upon the right temporal lobe with edema in the white matter and likely entrapment of the temporal horn of the right lateral ventricle. 2. No acute intracranial abnormality. 3. Moderate age related cortical and deep atrophy and mild chronic microvascular ischemic changes of the white matter. A non-emergent MRI of the brain may be helpful in further evaluation to confirm the likely dural origin of the right temporal mass. Electronically Signed   By: Evangeline Dakin M.D.   On: 02/11/2016 17:12   Ct Chest Wo Contrast  02/11/2016  CLINICAL DATA:  Post trip and fall now with right-sided chest pain. EXAM: CT CHEST WITHOUT CONTRAST TECHNIQUE: Multidetector CT imaging of the chest was performed following the standard protocol without IV contrast. COMPARISON:  Chest radiograph - earlier same day FINDINGS: Opacity seen on preceding chest radiograph correlates with an approximately 2.8 x 3.0 cm macro lobular mass (image 52, series 205) which is noted to be located within the nondependent portion of a large bleb within the right upper lobe. Severe paraseptal emphysematous change.  No pneumothorax. Rather extensive reticular opacities within the bilateral lung bases then about the bilateral costophrenic angles without definitive honeycombing. No focal airspace opacities. No pleural effusion or pneumothorax. The central pulmonary airways remain patent. No additional discrete pulmonary nodules given extensive background parenchymal abnormalities. Right hilar adenopathy with index right suprahilar lymph node measuring 1.3 cm in greatest short axis diameter (image 59, series 201). Additional  scattered mediastinal lymph nodes are numerous though individually not enlarged by size criteria with index precarinal lymph node measuring 0.9 cm in greatest short axis diameter (50, series 201). No axillary lymphadenopathy. Cardiomegaly. Small amount of pericardial fluid, presumably physiologic. Coronary artery calcifications. Calcifications within the aortic annulus and suspected within the aortic leaflets. Scattered atherosclerotic plaque with a normal caliber thoracic aorta. Limited noncontrast evaluation of the upper abdomen demonstrates the laminated gallstone within the gallbladder. The bilateral kidneys appear atrophic. No acute or aggressive osseous abnormalities. Serpiginous lucency involving the superior endplate of the L1 vertebral body without associated fracture line or vertebral body height loss. Regional soft tissues appear normal. Normal noncontrast appearance of the thyroid gland. IMPRESSION: 1. Opacity seen on preceding chest radiograph correlates with a approximately 3 cm macular lobular mass within the right upper lobe located within the nondependent portion of a large bleb. Differential considerations include a mycetoma (favored) though malignancy is not excluded on the basis of this examination. Further evaluation with PET scan could be performed as indicated. 2. Solitary right hilar lymph node nonspecific and while potentially reactive given advanced parenchymal abnormalities, attention on follow-up is recommended. 3. Advanced paraseptal emphysematous change. 4. Rather extensive reticular opacities within the bilateral lung bases and costophrenic angles without evidence of honeycombing. 5. Cardiomegaly. Atherosclerosis including coronary artery calcifications. 6. Cholelithiasis. 7. Serpiginous lucency involving the superior endplate of the L1 vertebral body without associated fracture line or vertebral body height loss is nonspecific though a potential developing acute compression fracture  could have this appearance. Correlation for tenderness at this location is recommended. Further evaluation with MRI could be performed as indicated. Electronically Signed   By: Sandi Mariscal M.D.   On: 02/11/2016 22:35   Ct Hip Right Wo Contrast  02/11/2016  CLINICAL DATA:  Low back pain and right hip pain secondary to a fall. The patient is on blood  thinner. History of osteosarcoma of the right hip. EXAM: CT OF THE RIGHT HIP WITHOUT CONTRAST TECHNIQUE: Multidetector CT imaging of the right hip was performed according to the standard protocol. Multiplanar CT image reconstructions were also generated. COMPARISON:  CT scan dated 02/11/2016 and radiographs dated 05/03 02/11/2016 and bone scan dated 09/01/2010 FINDINGS: Right hip prosthesis is in excellent position with no evidence of loosening. There is no fracture or other acute abnormality. There is slight atypical osteopenia of the thickened right femoral shaft, possibly due to prior radiation treatment. Degenerative cyst formation in the roof of the right acetabulum. Evidence of lower lumbar fusion. Atypical patchy sclerosis of the ileum at the right SI joint. This is not felt to be significant. IMPRESSION: No acute abnormality of the right hip. Electronically Signed   By: Lorriane Shire M.D.   On: 02/11/2016 18:23   Dg Hip Unilat  With Pelvis 2-3 Views Right  02/11/2016  CLINICAL DATA:  Fall, hip pain EXAM: DG HIP (WITH OR WITHOUT PELVIS) 2-3V RIGHT COMPARISON:  None. FINDINGS: Three views of the right hip submitted. No acute fracture or subluxation. There is diffuse osteopenia. Right hip prosthesis with anatomic alignment. No evidence of prosthesis loosening. IMPRESSION: Diffuse osteopenia. No acute fracture or subluxation. Right hip prosthesis with anatomic alignment. Electronically Signed   By: Lahoma Crocker M.D.   On: 02/11/2016 14:11      No results found for: HGBA1C Lab Results  Component Value Date   CREATININE 1.75* 02/12/2016       Scheduled  Meds: . calcium-vitamin D  1 tablet Oral Q breakfast  . carvedilol  3.125 mg Oral BID WC  . cefTRIAXone (ROCEPHIN)  IV  1 g Intravenous Q24H  . cholecalciferol  1,000 Units Oral Daily  . finasteride  5 mg Oral Daily  . multivitamin with minerals  1 tablet Oral Daily  . omega-3 acid ethyl esters  2 g Oral Daily  . pravastatin  40 mg Oral Daily  . tamsulosin  0.4 mg Oral Daily  . Warfarin - Pharmacist Dosing Inpatient   Does not apply q1800   Continuous Infusions:    LOS: 1 day    Time spent: >30 MINS    [facility] Pager [phone]. If 7PM-7AM, please contact night-coverage at [email or web address], password [facility] 02/12/2016, 9:21 AM  LOS: 1 day

## 2016-02-12 NOTE — Consult Note (Signed)
Consultation Note Date: 02/12/2016   Patient Name: Darrell Mendoza  DOB: 12-25-1924  MRN: [id number]  Age / Sex: 82 y.o., male  PCP: Haywood Pao, MD Referring Physician: Reyne Dumas, MD  Reason for Consultation: Establishing goals of care  HPI/Patient Profile: 82 y.o. male  admitted on 02/11/2016 with 82 y.o. 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 Patient presents to the ED with c/o back pain that radiates to R hip s/p fall, family reports "at least 10 falls" in the last year.    Patient tripped on a rug in the bathroom 2 days ago. Fell on to right chest and hip with immediate onset of severe pain.   ED Course: CT abd/pelvis and X ray of hip are negative for hip fracture. Does identify an L1 vertebral compression fracture without significant loss of hight, this does appear acute and patient has significant tenderness at this point on exam. He is also found to have a UTI, and a 3.2cm lung nodule / mass. Patient is able to ambulate with assistance in the ED; however, he has hypoxia to the low 80s     Clinical Assessment and Goals of Care:  This NP Wadie Lessen reviewed medical records, received report from team, assessed the patient and then meet at the patient's bedside along with his wife and daughter in las/[place] by telephone (she is  A Therapist, sports)  to discuss diagnosis prognosis, GOC, EOL wishes disposition and options.   A detailed discussion was had today regarding advanced directives.  Concepts specific to code status, artifical feeding and hydration, continued IV antibiotics and rehospitalization was had.   We discussed his multiple co-morbidites; CHF, CKD, and newly diagnosed lung mass.  It will be important as a family to make decisions regarding further work-up and decisions regarding treatment options vs  comfort path   I have some concern related to short term memory issues for both patient and his wife as both tell me they have not heard this information before, and both report no fall history even  though daughter in law reports many falls at home over the past year.  The difference between a aggressive medical intervention path  and a palliative comfort care path for this patient at this time was had.  Values and goals of care important to patient and family were attempted to be elicited.  Concept of Hospice and Palliative Care were discussed    Questions and concerns addressed.   Family encouraged to call with questions or concerns.  PMT will continue to support holistically.   As a family they would like patient discharged home when medically stable,  and they will discuss further workup with their PCP Dr Osborne Casco at that time.   HCPOA/ wife    SUMMARY OF RECOMMENDATIONS    Code Status/Advance Care Planning:  DNR   Palliative Prophylaxis:   Aspiration, Bowel Regimen, Delirium Protocol and Frequent Pain Assessment   Psycho-social/Spiritual:   Desire for  further Chaplaincy support: Strong community church support  Additional Recommendations: Education on Hospice  Prognosis:   Unable to determine- depends on desire for life prolonging measures  Discharge Planning: As a family they would like patient discharged home when medically stable,  and they will discuss further workup with their PCP Dr Osborne Casco at that time.     Primary Diagnoses: Present on Admission:  . Acute respiratory failure with hypoxia ([place]) . Atrial fibrillation ([place]) . UTI (lower urinary tract infection) . Compression fracture of L1 lumbar vertebra ([facility])  I have reviewed the medical record, interviewed the patient and family, and examined the patient. The following aspects are pertinent.  Past Medical History  Diagnosis Date  . Pulmonary embolism ([place])     05/2011  . Hypotension   . Benign  prostatic hypertrophy   . Greenfield filter in place     History of chronic lower extremity clots  . Deep vein thrombosis ([facility])     Coumadin anticoagulation for deep vein thrombosis  . Hyperlipidemia   . Hypertension      History of hypertension  . Lightheadedness   . Premature ventricular contractions   . Hand arthritis   . Coronary artery disease     s/p 3.0x23 cypher mid rca 2004  . Osteosarcoma ([place]) 2001    of the right hip  . Bilateral cataracts   . Atrial fibrillation ([place]) 12/11/2015  . CKD (chronic kidney disease) stage 3, GFR 30-59 ml/min    Social History   Social History  . Marital Status: Married    Spouse Name: N/A  . Number of Children: 2  . Years of Education: N/A   Occupational History  . factory      retired   Social History Main Topics  . Smoking status: Former Research scientist (life sciences)  . Smokeless tobacco: Former Systems developer    Quit date: 10/20/1983  . Alcohol Use: Yes     Comment: very little  . Drug Use: No  . Sexual Activity: Not Asked   Other Topics Concern  . None   Social History Narrative   Family History  Problem Relation Age of Onset  . Tuberculosis Mother    Scheduled Meds: . calcium-vitamin D  1 tablet Oral Q breakfast  . carvedilol  3.125 mg Oral BID WC  . cefTRIAXone (ROCEPHIN)  IV  1 g Intravenous Q24H  . cholecalciferol  1,000 Units Oral Daily  . finasteride  5 mg Oral Daily  . fluticasone furoate-vilanterol  1 puff Inhalation Daily  . multivitamin with minerals  1 tablet Oral Daily  . omega-3 acid ethyl esters  2 g Oral Daily  . pravastatin  40 mg Oral Daily  . tamsulosin  0.4 mg Oral Daily  . Warfarin - Pharmacist Dosing Inpatient   Does not apply q1800   Continuous Infusions:  PRN Meds:.HYDROcodone-acetaminophen Medications Prior to Admission:  Prior to Admission medications   Medication Sig Start Date End Date Taking? Authorizing Provider  calcium-vitamin D (OSCAL WITH D) 250-125 MG-UNIT per tablet Take 1 tablet by mouth daily.   Yes  Historical Provider, MD  carvedilol (COREG) 3.125 MG tablet Take 1 tablet (3.125 mg total) by mouth 2 (two) times daily. 01/26/16  Yes Peter M Martinique, MD  finasteride (PROSCAR) 5 MG tablet Take 5 mg by mouth daily. 12/26/15  Yes Historical Provider, MD  fish oil-omega-3 fatty acids 1000 MG capsule Take 2 g by mouth daily.   Yes Historical Provider, MD  lisinopril (PRINIVIL,ZESTRIL) 5 MG tablet Take  1 tablet (5 mg total) by mouth daily. 12/15/15  Yes Peter M Martinique, MD  Multiple Vitamin (MULTIVITAMIN) tablet Take 1 tablet by mouth daily.   Yes Historical Provider, MD  pravastatin (PRAVACHOL) 40 MG tablet Take 40 mg by mouth daily.   Yes Historical Provider, MD  Tamsulosin HCl (FLOMAX) 0.4 MG CAPS Take 0.4 mg by mouth daily.   Yes Historical Provider, MD  vitamin D, CHOLECALCIFEROL, 400 UNITS tablet Take 400 Units by mouth daily.   Yes Historical Provider, MD  warfarin (COUMADIN) 5 MG tablet Take 2.5-5 mg by mouth daily. Take 2.5 mg Sun / Tues / Wed / Thurs / Sat Take 5 mg on Mon / Fri   Yes Historical Provider, MD   No Known Allergies Review of Systems  Constitutional: Positive for fatigue.    Physical Exam  Constitutional: He appears well-developed. He appears ill. Nasal cannula in place.  Cardiovascular: Normal rate, regular rhythm and normal heart sounds.   Pulmonary/Chest: He has decreased breath sounds in the right lower field.  Skin: Skin is warm and dry.    Vital Signs: BP 97/59 mmHg  Pulse 73  Temp(Src) 97.7 F (36.5 C) (Oral)  Resp 16  SpO2 96% Pain Assessment: No/denies pain   Pain Score: 0-No pain   SpO2: SpO2: 96 % O2 Device:SpO2: 96 % O2 Flow Rate: .O2 Flow Rate (L/min): 4 L/min  IO: Intake/output summary:  Intake/Output Summary (Last 24 hours) at 02/12/16 1328 Last data filed at 02/12/16 0900  Gross per 24 hour  Intake    120 ml  Output    200 ml  Net    -80 ml    LBM: Last BM Date: 02/10/16 Baseline Weight:   Most recent weight:       Palliative  Assessment/Data: currently 40%,    Flowsheet Rows        Most Recent Value   Intake Tab    Referral Department  Hospitalist   Unit at Time of Referral  Med/Surg Unit   Palliative Care Primary Diagnosis  Pulmonary   Date Notified  02/12/16   Palliative Care Type  New Palliative care   Reason for referral  Clarify Goals of Care   Date of Admission  02/11/16   # of days IP prior to Palliative referral  1   Clinical Assessment    Psychosocial & Spiritual Assessment    Palliative Care Outcomes       Time In: 1300 Time Out: 1415 Time Total: 75 min Greater than 50%  of this time was spent counseling and coordinating care related to the above assessment and plan.  Signed by: Wadie Lessen, NP   Please contact Palliative Medicine Team phone at [phone] for questions and concerns.  For individual provider: See Shea Evans

## 2016-02-13 DIAGNOSIS — I5022 Chronic systolic (congestive) heart failure: Secondary | ICD-10-CM

## 2016-02-13 LAB — COMPREHENSIVE METABOLIC PANEL
ALBUMIN: 2.6 g/dL — AB (ref 3.5–5.0)
ALK PHOS: 53 U/L (ref 38–126)
ALT: 18 U/L (ref 17–63)
ANION GAP: 14 (ref 5–15)
AST: 24 U/L (ref 15–41)
BUN: 43 mg/dL — AB (ref 6–20)
CO2: 20 mmol/L — AB (ref 22–32)
Calcium: 8.5 mg/dL — ABNORMAL LOW (ref 8.9–10.3)
Chloride: 105 mmol/L (ref 101–111)
Creatinine, Ser: 1.64 mg/dL — ABNORMAL HIGH (ref 0.61–1.24)
GFR calc Af Amer: 41 mL/min — ABNORMAL LOW (ref >60)
GFR calc non Af Amer: 36 mL/min — ABNORMAL LOW (ref >60)
GLUCOSE: 108 mg/dL — AB (ref 65–99)
POTASSIUM: 5 mmol/L (ref 3.5–5.1)
SODIUM: 139 mmol/L (ref 135–145)
Total Bilirubin: 0.7 mg/dL (ref 0.3–1.2)
Total Protein: 6.1 g/dL — ABNORMAL LOW (ref 6.5–8.1)

## 2016-02-13 LAB — CBC
HCT: 39.6 % (ref 39.0–52.0)
HEMOGLOBIN: 12.9 g/dL — AB (ref 13.0–17.0)
MCH: 30.8 pg (ref 26.0–34.0)
MCHC: 32.6 g/dL (ref 30.0–36.0)
MCV: 94.5 fL (ref 78.0–100.0)
Platelets: 91 10*3/uL — ABNORMAL LOW (ref 150–400)
RBC: 4.19 MIL/uL — ABNORMAL LOW (ref 4.22–5.81)
RDW: 15.6 % — ABNORMAL HIGH (ref 11.5–15.5)
WBC: 3.1 10*3/uL — AB (ref 4.0–10.5)

## 2016-02-13 LAB — PROTIME-INR
INR: 4.02 — AB (ref 0.00–1.49)
PROTHROMBIN TIME: 38.2 s — AB (ref 11.6–15.2)

## 2016-02-13 MED ORDER — WARFARIN SODIUM 5 MG PO TABS: 2.5000 mg | ORAL_TABLET | Freq: Every day | ORAL | Status: AC

## 2016-02-13 MED ORDER — CEPHALEXIN 500 MG PO CAPS: 500.0000 mg | ORAL_CAPSULE | Freq: Two times a day (BID) | ORAL | Status: DC

## 2016-02-13 MED ORDER — ONDANSETRON HCL 4 MG/2ML IJ SOLN
INTRAMUSCULAR | Status: AC
  Filled 2016-02-13: qty 2

## 2016-02-13 MED ORDER — ONDANSETRON HCL 4 MG/2ML IJ SOLN
4.0000 mg | Freq: Four times a day (QID) | INTRAMUSCULAR | Status: DC | PRN
  Administered 2016-02-13: 4 mg via INTRAVENOUS

## 2016-02-13 NOTE — Progress Notes (Signed)
O2 sat on RA 87% at rest

## 2016-02-13 NOTE — Progress Notes (Signed)
Patient has c/o nausea. No emisis. Paged Baltazar Najjar. Order given for zofran and administered.  Will monitor for effectiveness.

## 2016-02-13 NOTE — Discharge Summary (Signed)
Physician Discharge Summary  Darrell Mendoza MRN: [id number] DOB/AGE: 03-02-1925 82 y.o.  PCP: Haywood Pao, MD   Admit date: 02/11/2016 Discharge date: 02/13/2016  Discharge Diagnoses:   Principal Problem:   Acute respiratory failure with hypoxia [facility]) Active Problems:   Atrial fibrillation ([place])   UTI (lower urinary tract infection)   Compression fracture of L1 lumbar vertebra [facility])   Palliative care encounter   DNR (do not resuscitate)   Weakness generalized   Lung mass    Follow-up recommendations Follow-up with PCP in 3-5 days , including all  additional recommended appointments as below Follow-up CBC, CMP in 3-5 days Patient will need pulmonary evaluation as well as a PET CT scan for 3.2cm lung nodule / mass, patient to discuss this with PCP Patient supratherapeutic on the day of discharge and advised to hold Coumadin until Monday, needs to see primary care doctor on Monday for repeat INR prior to resuming Coumadin     Current Discharge Medication List    START taking these medications   Details  cephALEXin (KEFLEX) 500 MG capsule Take 1 capsule (500 mg total) by mouth 2 (two) times daily. Qty: 20 capsule, Refills: 0      CONTINUE these medications which have CHANGED   Details  warfarin (COUMADIN) 5 MG tablet Take 0.5-1 tablets (2.5-5 mg total) by mouth daily. Take 2.5 mg Sun / Tues / Wed / Thurs / Sat Take 5 mg on Mon / Fri Qty: 30 tablet, Refills: 0      CONTINUE these medications which have NOT CHANGED   Details  calcium-vitamin D (OSCAL WITH D) 250-125 MG-UNIT per tablet Take 1 tablet by mouth daily.    carvedilol (COREG) 3.125 MG tablet Take 1 tablet (3.125 mg total) by mouth 2 (two) times daily. Qty: 180 tablet, Refills: 3   Associated Diagnoses: Nonischemic cardiomyopathy (HCC)    finasteride (PROSCAR) 5 MG tablet Take 5 mg by mouth daily.    fish oil-omega-3 fatty acids 1000 MG capsule Take 2 g by mouth daily.    Multiple Vitamin (MULTIVITAMIN)  tablet Take 1 tablet by mouth daily.    pravastatin (PRAVACHOL) 40 MG tablet Take 40 mg by mouth daily.    Tamsulosin HCl (FLOMAX) 0.4 MG CAPS Take 0.4 mg by mouth daily.    vitamin D, CHOLECALCIFEROL, 400 UNITS tablet Take 400 Units by mouth daily.      STOP taking these medications     lisinopril (PRINIVIL,ZESTRIL) 5 MG tablet          Discharge Condition: *Stable   Discharge Instructions Get Medicines reviewed and adjusted: Please take all your medications with you for your next visit with your Primary MD  Please request your Primary MD to go over all hospital tests and procedure/radiological results at the follow up, please ask your Primary MD to get all Hospital records sent to his/her office.  If you experience worsening of your admission symptoms, develop shortness of breath, life threatening emergency, suicidal or homicidal thoughts you must seek medical attention immediately by calling 911 or calling your MD immediately if symptoms less severe.  You must read complete instructions/literature along with all the possible adverse reactions/side effects for all the Medicines you take and that have been prescribed to you. Take any new Medicines after you have completely understood and accpet all the possible adverse reactions/side effects.   Do not drive when taking Pain medications.   Do not take more than prescribed Pain, Sleep and Anxiety Medications  Special  Instructions: If you have smoked or chewed Tobacco in the last 2 yrs please stop smoking, stop any regular Alcohol and or any Recreational drug use.  Wear Seat belts while driving.  Please note  You were cared for by a hospitalist during your hospital stay. Once you are discharged, your primary care physician will handle any further medical issues. Please note that NO REFILLS for any discharge medications will be authorized once you are discharged, as it is imperative that you return to your primary care  physician (or establish a relationship with a primary care physician if you do not have one) for your aftercare needs so that they can reassess your need for medications and monitor your lab values.     No Known Allergies    Disposition: 01-Home or Self Care   Consults: * Palliative care     Significant Diagnostic Studies:  Ct Abdomen Pelvis Wo Contrast  02/11/2016  CLINICAL DATA:  Tripped and fell on right side, history of hip replacement, low back pain EXAM: CT ABDOMEN AND PELVIS WITHOUT CONTRAST TECHNIQUE: Multidetector CT imaging of the abdomen and pelvis was performed following the standard protocol without IV contrast. COMPARISON:  None. FINDINGS: Lower chest: Images of the lung bases shows bilateral emphysematous changes. Peripheral fibrotic changes and honeycombing in noted. Atherosclerotic calcifications of coronary arteries. There is moderate size hiatal hernia measures at least 6.8 cm. Hepatobiliary: Unenhanced liver shows no biliary ductal dilatation. No perihepatic ascites. Calcified gallstone within gallbladder measures 1.4 cm. Pancreas: Unenhanced pancreas is mild atrophic. Spleen: Unenhanced spleen is unremarkable. Adrenals/Urinary Tract: No adrenal gland mass. Unenhanced kidneys are atrophic with cortical thinning. No nephrolithiasis. No hydronephrosis or hydroureter. No calcified ureteral calculi are noted. Stomach/Bowel: Study is limited without IV or oral contrast. No gastric outlet obstruction. No small bowel obstruction. No thickened or dilated small bowel loops are noted. There is no pericecal inflammation. The terminal ileum is unremarkable. Vascular/Lymphatic: IVC filter in place is noted. Extensive atherosclerotic calcifications of abdominal aorta, SMA, splenic artery and bilateral common iliac arteries. There is no aortic aneurysm. Reproductive: Limited evaluation of the pelvis due to extensive metallic artifacts from right hip prosthesis. The visualized prostate gland  is unremarkable. Other: No ascites or free air. Musculoskeletal: There is diffuse osteopenia. Multilevel degenerative changes are noted thoracolumbar spine. Posterior metallic fusion noted at B3-A1 level. There is disc space flattening with vacuum disc phenomenon mild anterior and mild posterior spurring at L2-L3 level. Axial image 26 there is mild compression fracture upper endplate of L1 vertebral body. Best seen in sagittal image 63. This is highly suspicious for acute or subacute compression fracture. There is no bony protrusion in spinal canal at this level. No spinal canal stenosis. No paraspinal hematoma. There is right hip prosthesis with anatomic alignment. No gross fracture or subluxation. No sacral insufficiency fracture is identified. Degenerative changes are noted right SI joint. IMPRESSION: 1. Emphysematous and fibrotic changes are noted lung bases. 2. Calcified gallstone within gallbladder measures 1.4 cm. 3. Extensive atherosclerotic vascular calcifications are noted. 4. Bilateral atrophic kidneys without hydronephrosis or hydroureter. No calcified ureteral calculi. 5. IVC filter in place. 6. Limited evaluation of the pelvis due to extensive metallic artifacts from right hip prosthesis. No gross acute fractures are identified within pelvis. 7. There is disc space flattening with vacuum disc phenomenon mild anterior and mild posterior spurring at L2-L3 level. Axial image 26 there is mild compression fracture upper endplate of L1 vertebral body. Best seen in sagittal image 63. This is  highly suspicious for acute or subacute compression fracture. There is no bony protrusion in spinal canal at this level. No spinal canal stenosis. No paraspinal hematoma. 8. Posterior metallic fusion lumbar spine L5-S1 level. No sacral insufficiency fracture is identified. Electronically Signed   By: Lahoma Crocker M.D.   On: 02/11/2016 17:23   Dg Chest 1 View  02/11/2016  CLINICAL DATA:  Post fall yesterday, now with  severe back pain. EXAM: CHEST 1 VIEW COMPARISON:  05/23/2011; CT abdomen pelvis- 02/11/2016 FINDINGS: Enlarged cardiac silhouette and mediastinal contours, progressed since the 05/2011 examination. Advanced emphysematous change with thinning of the biapical pulmonary parenchymal, left greater than right, and diffuse slightly nodular thickening of the pulmonary interstitium. Interval development of a approximately 3.2 x 2.5 cm nodular opacity/mass within the peripheral aspect of the right upper lung. Slight worsening of left basilar / retrocardiac opacities, likely atelectasis. No definite pleural effusion or pneumothorax. No definite evidence of edema. No definite acute osseous abnormalities. IMPRESSION: 1. Advanced emphysematous change without definitive superimposed acute cardiopulmonary disease. 2. Interval development of an apparent 3.2 cm nodule/mass within the peripheral aspect of the right upper lung. Further evaluation with nonemergent contrast-enhanced chest CT is recommended for further evaluation. Electronically Signed   By: Sandi Mariscal M.D.   On: 02/11/2016 17:50   Ct Head Wo Contrast  02/11/2016  CLINICAL DATA:  82 year old who tripped and fell onto his right side. Patient currently anticoagulated for prior history of pulmonary embolism. EXAM: CT HEAD WITHOUT CONTRAST TECHNIQUE: Contiguous axial images were obtained from the base of the skull through the vertex without intravenous contrast. COMPARISON:  None. FINDINGS: Moderate age related cortical and deep atrophy. Mild changes of small vessel disease of the white matter. Partially calcified likely extra-axial mass arising from the right temporal bone measuring maximally approximately 1.5 x 4.0 cm, with mass effect on the right temporal lobe and edema in the white matter of the right temporal lobe. As a result, the temporal horn of the right lateral ventricle is likely entrapped. No midline shift. No acute hemorrhage or hematoma. No skull fracture or  other focal osseous abnormality involving the skull. Visualized paranasal sinuses, bilateral mastoid air cells and bilateral middle ear cavities well-aerated. Severe bilateral carotid siphon and vertebral artery atherosclerosis. IMPRESSION: 1. Partially calcified likely meningioma arising from the right temporal bone with maximum measurements approximating 1.5 x 4.0 cm. The likely meningioma causes mass effect upon the right temporal lobe with edema in the white matter and likely entrapment of the temporal horn of the right lateral ventricle. 2. No acute intracranial abnormality. 3. Moderate age related cortical and deep atrophy and mild chronic microvascular ischemic changes of the white matter. A non-emergent MRI of the brain may be helpful in further evaluation to confirm the likely dural origin of the right temporal mass. Electronically Signed   By: Evangeline Dakin M.D.   On: 02/11/2016 17:12   Ct Chest Wo Contrast  02/11/2016  CLINICAL DATA:  Post trip and fall now with right-sided chest pain. EXAM: CT CHEST WITHOUT CONTRAST TECHNIQUE: Multidetector CT imaging of the chest was performed following the standard protocol without IV contrast. COMPARISON:  Chest radiograph - earlier same day FINDINGS: Opacity seen on preceding chest radiograph correlates with an approximately 2.8 x 3.0 cm macro lobular mass (image 52, series 205) which is noted to be located within the nondependent portion of a large bleb within the right upper lobe. Severe paraseptal emphysematous change.  No pneumothorax. Rather extensive reticular opacities within  the bilateral lung bases then about the bilateral costophrenic angles without definitive honeycombing. No focal airspace opacities. No pleural effusion or pneumothorax. The central pulmonary airways remain patent. No additional discrete pulmonary nodules given extensive background parenchymal abnormalities. Right hilar adenopathy with index right suprahilar lymph node measuring 1.3  cm in greatest short axis diameter (image 59, series 201). Additional scattered mediastinal lymph nodes are numerous though individually not enlarged by size criteria with index precarinal lymph node measuring 0.9 cm in greatest short axis diameter (50, series 201). No axillary lymphadenopathy. Cardiomegaly. Small amount of pericardial fluid, presumably physiologic. Coronary artery calcifications. Calcifications within the aortic annulus and suspected within the aortic leaflets. Scattered atherosclerotic plaque with a normal caliber thoracic aorta. Limited noncontrast evaluation of the upper abdomen demonstrates the laminated gallstone within the gallbladder. The bilateral kidneys appear atrophic. No acute or aggressive osseous abnormalities. Serpiginous lucency involving the superior endplate of the L1 vertebral body without associated fracture line or vertebral body height loss. Regional soft tissues appear normal. Normal noncontrast appearance of the thyroid gland. IMPRESSION: 1. Opacity seen on preceding chest radiograph correlates with a approximately 3 cm macular lobular mass within the right upper lobe located within the nondependent portion of a large bleb. Differential considerations include a mycetoma (favored) though malignancy is not excluded on the basis of this examination. Further evaluation with PET scan could be performed as indicated. 2. Solitary right hilar lymph node nonspecific and while potentially reactive given advanced parenchymal abnormalities, attention on follow-up is recommended. 3. Advanced paraseptal emphysematous change. 4. Rather extensive reticular opacities within the bilateral lung bases and costophrenic angles without evidence of honeycombing. 5. Cardiomegaly. Atherosclerosis including coronary artery calcifications. 6. Cholelithiasis. 7. Serpiginous lucency involving the superior endplate of the L1 vertebral body without associated fracture line or vertebral body height loss is  nonspecific though a potential developing acute compression fracture could have this appearance. Correlation for tenderness at this location is recommended. Further evaluation with MRI could be performed as indicated. Electronically Signed   By: Sandi Mariscal M.D.   On: 02/11/2016 22:35   Ct Hip Right Wo Contrast  02/11/2016  CLINICAL DATA:  Low back pain and right hip pain secondary to a fall. The patient is on blood thinner. History of osteosarcoma of the right hip. EXAM: CT OF THE RIGHT HIP WITHOUT CONTRAST TECHNIQUE: Multidetector CT imaging of the right hip was performed according to the standard protocol. Multiplanar CT image reconstructions were also generated. COMPARISON:  CT scan dated 02/11/2016 and radiographs dated 05/03 02/11/2016 and bone scan dated 09/01/2010 FINDINGS: Right hip prosthesis is in excellent position with no evidence of loosening. There is no fracture or other acute abnormality. There is slight atypical osteopenia of the thickened right femoral shaft, possibly due to prior radiation treatment. Degenerative cyst formation in the roof of the right acetabulum. Evidence of lower lumbar fusion. Atypical patchy sclerosis of the ileum at the right SI joint. This is not felt to be significant. IMPRESSION: No acute abnormality of the right hip. Electronically Signed   By: Lorriane Shire M.D.   On: 02/11/2016 18:23   Dg Hip Unilat  With Pelvis 2-3 Views Right  02/11/2016  CLINICAL DATA:  Fall, hip pain EXAM: DG HIP (WITH OR WITHOUT PELVIS) 2-3V RIGHT COMPARISON:  None. FINDINGS: Three views of the right hip submitted. No acute fracture or subluxation. There is diffuse osteopenia. Right hip prosthesis with anatomic alignment. No evidence of prosthesis loosening. IMPRESSION: Diffuse osteopenia. No acute fracture or subluxation.  Right hip prosthesis with anatomic alignment. Electronically Signed   By: Lahoma Crocker M.D.   On: 02/11/2016 14:11      Filed Weights   02/12/16 0005  Weight: 78.1 kg  (172 lb 2.9 oz)     Microbiology: No results found for this or any previous visit (from the past 240 hour(s)).     Blood Culture No results found for: SDES, SPECREQUEST, CULT, REPTSTATUS    Labs: Results for orders placed or performed during the hospital encounter of 02/11/16 (from the past 48 hour(s))  CBC with Differential     Status: Abnormal   Collection Time: 02/11/16  4:14 PM  Result Value Ref Range   WBC 5.0 4.0 - 10.5 K/uL   RBC 3.91 (L) 4.22 - 5.81 MIL/uL   Hemoglobin 11.8 (L) 13.0 - 17.0 g/dL   HCT 36.5 (L) 39.0 - 52.0 %   MCV 93.4 78.0 - 100.0 fL   MCH 30.2 26.0 - 34.0 pg   MCHC 32.3 30.0 - 36.0 g/dL   RDW 15.6 (H) 11.5 - 15.5 %   Platelets 139 (L) 150 - 400 K/uL   Neutrophils Relative % 74 %   Lymphocytes Relative 18 %   Monocytes Relative 6 %   Eosinophils Relative 2 %   Basophils Relative 0 %   Neutro Abs 3.7 1.7 - 7.7 K/uL   Lymphs Abs 0.9 0.7 - 4.0 K/uL   Monocytes Absolute 0.3 0.1 - 1.0 K/uL   Eosinophils Absolute 0.1 0.0 - 0.7 K/uL   Basophils Absolute 0.0 0.0 - 0.1 K/uL   WBC Morphology MILD LEFT SHIFT (1-5% METAS, OCC MYELO, OCC BANDS)   Comprehensive metabolic panel     Status: Abnormal   Collection Time: 02/11/16  4:14 PM  Result Value Ref Range   Sodium 138 135 - 145 mmol/L   Potassium 5.1 3.5 - 5.1 mmol/L   Chloride 107 101 - 111 mmol/L   CO2 20 (L) 22 - 32 mmol/L   Glucose, Bld 118 (H) 65 - 99 mg/dL   BUN 37 (H) 6 - 20 mg/dL   Creatinine, Ser 1.96 (H) 0.61 - 1.24 mg/dL   Calcium 9.1 8.9 - 10.3 mg/dL   Total Protein 7.0 6.5 - 8.1 g/dL   Albumin 3.3 (L) 3.5 - 5.0 g/dL   AST 32 15 - 41 U/L   ALT 15 (L) 17 - 63 U/L   Alkaline Phosphatase 64 38 - 126 U/L   Total Bilirubin 0.9 0.3 - 1.2 mg/dL   GFR calc non Af Amer 29 (L) >60 mL/min   GFR calc Af Amer 33 (L) >60 mL/min    Comment: (NOTE) The eGFR has been calculated using the CKD EPI equation. This calculation has not been validated in all clinical situations. eGFR's persistently <60  mL/min signify possible Chronic Kidney Disease.    Anion gap 11 5 - 15  Protime-INR     Status: Abnormal   Collection Time: 02/11/16  4:14 PM  Result Value Ref Range   Prothrombin Time 30.0 (H) 11.6 - 15.2 seconds   INR 2.92 (H) 0.00 - 1.49  I-Stat Chem 8, ED     Status: Abnormal   Collection Time: 02/11/16  4:25 PM  Result Value Ref Range   Sodium 139 135 - 145 mmol/L   Potassium 4.9 3.5 - 5.1 mmol/L   Chloride 106 101 - 111 mmol/L   BUN 37 (H) 6 - 20 mg/dL   Creatinine, Ser 1.80 (H) 0.61 - 1.24  mg/dL   Glucose, Bld 117 (H) 65 - 99 mg/dL   Calcium, Ion 1.14 1.13 - 1.30 mmol/L   TCO2 22 0 - 100 mmol/L   Hemoglobin 13.3 13.0 - 17.0 g/dL   HCT 39.0 39.0 - 52.0 %  Urinalysis, Routine w reflex microscopic (not at [facility])     Status: Abnormal   Collection Time: 02/11/16  4:31 PM  Result Value Ref Range   Color, Urine YELLOW YELLOW   APPearance CLOUDY (A) CLEAR   Specific Gravity, Urine 1.018 1.005 - 1.030   pH 5.5 5.0 - 8.0   Glucose, UA NEGATIVE NEGATIVE mg/dL   Hgb urine dipstick SMALL (A) NEGATIVE   Bilirubin Urine NEGATIVE NEGATIVE   Ketones, ur 15 (A) NEGATIVE mg/dL   Protein, ur NEGATIVE NEGATIVE mg/dL   Nitrite NEGATIVE NEGATIVE   Leukocytes, UA LARGE (A) NEGATIVE  Urine microscopic-add on     Status: Abnormal   Collection Time: 02/11/16  4:31 PM  Result Value Ref Range   Squamous Epithelial / LPF 0-5 (A) NONE SEEN   WBC, UA TOO NUMEROUS TO COUNT 0 - 5 WBC/hpf   RBC / HPF 6-30 0 - 5 RBC/hpf   Bacteria, UA MANY (A) NONE SEEN  Protime-INR     Status: Abnormal   Collection Time: 02/12/16  6:32 AM  Result Value Ref Range   Prothrombin Time 37.6 (H) 11.6 - 15.2 seconds   INR 3.94 (H) 0.00 - 1.49  CBC     Status: Abnormal   Collection Time: 02/12/16  6:32 AM  Result Value Ref Range   WBC 4.0 4.0 - 10.5 K/uL   RBC 3.25 (L) 4.22 - 5.81 MIL/uL   Hemoglobin 10.0 (L) 13.0 - 17.0 g/dL    Comment: REPEATED TO VERIFY   HCT 30.8 (L) 39.0 - 52.0 %   MCV 94.8 78.0 - 100.0 fL    MCH 30.8 26.0 - 34.0 pg   MCHC 32.5 30.0 - 36.0 g/dL   RDW 15.5 11.5 - 15.5 %   Platelets 114 (L) 150 - 400 K/uL    Comment: SPECIMEN CHECKED FOR CLOTS REPEATED TO VERIFY PLATELET COUNT CONFIRMED BY SMEAR   Basic metabolic panel     Status: Abnormal   Collection Time: 02/12/16  6:32 AM  Result Value Ref Range   Sodium 138 135 - 145 mmol/L   Potassium 4.7 3.5 - 5.1 mmol/L   Chloride 110 101 - 111 mmol/L   CO2 18 (L) 22 - 32 mmol/L   Glucose, Bld 113 (H) 65 - 99 mg/dL   BUN 37 (H) 6 - 20 mg/dL   Creatinine, Ser 1.75 (H) 0.61 - 1.24 mg/dL   Calcium 8.3 (L) 8.9 - 10.3 mg/dL   GFR calc non Af Amer 33 (L) >60 mL/min   GFR calc Af Amer 38 (L) >60 mL/min    Comment: (NOTE) The eGFR has been calculated using the CKD EPI equation. This calculation has not been validated in all clinical situations. eGFR's persistently <60 mL/min signify possible Chronic Kidney Disease.    Anion gap 10 5 - 15  Protime-INR     Status: Abnormal   Collection Time: 02/13/16  6:40 AM  Result Value Ref Range   Prothrombin Time 38.2 (H) 11.6 - 15.2 seconds   INR 4.02 (H) 0.00 - 1.49  CBC     Status: Abnormal   Collection Time: 02/13/16  6:40 AM  Result Value Ref Range   WBC 3.1 (L) 4.0 - 10.5 K/uL  RBC 4.19 (L) 4.22 - 5.81 MIL/uL   Hemoglobin 12.9 (L) 13.0 - 17.0 g/dL    Comment: REPEATED TO VERIFY   HCT 39.6 39.0 - 52.0 %   MCV 94.5 78.0 - 100.0 fL   MCH 30.8 26.0 - 34.0 pg   MCHC 32.6 30.0 - 36.0 g/dL   RDW 15.6 (H) 11.5 - 15.5 %   Platelets 91 (L) 150 - 400 K/uL    Comment: REPEATED TO VERIFY  Comprehensive metabolic panel     Status: Abnormal   Collection Time: 02/13/16  6:40 AM  Result Value Ref Range   Sodium 139 135 - 145 mmol/L   Potassium 5.0 3.5 - 5.1 mmol/L   Chloride 105 101 - 111 mmol/L   CO2 20 (L) 22 - 32 mmol/L   Glucose, Bld 108 (H) 65 - 99 mg/dL   BUN 43 (H) 6 - 20 mg/dL   Creatinine, Ser 1.64 (H) 0.61 - 1.24 mg/dL   Calcium 8.5 (L) 8.9 - 10.3 mg/dL   Total Protein 6.1 (L)  6.5 - 8.1 g/dL   Albumin 2.6 (L) 3.5 - 5.0 g/dL   AST 24 15 - 41 U/L   ALT 18 17 - 63 U/L   Alkaline Phosphatase 53 38 - 126 U/L   Total Bilirubin 0.7 0.3 - 1.2 mg/dL   GFR calc non Af Amer 36 (L) >60 mL/min   GFR calc Af Amer 41 (L) >60 mL/min    Comment: (NOTE) The eGFR has been calculated using the CKD EPI equation. This calculation has not been validated in all clinical situations. eGFR's persistently <60 mL/min signify possible Chronic Kidney Disease.    Anion gap 14 5 - 15     Lipid Panel  No results found for: CHOL, TRIG, HDL, CHOLHDL, VLDL, LDLCALC, LDLDIRECT   No results found for: HGBA1C   Lab Results  Component Value Date   CREATININE 1.64* 02/13/2016    82 y.o. 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 Patient presents to the ED with c/o back pain that radiates to R hip s/p fall. Patient tripped on a rug in the bathroom 2 days ago. Fell on to right chest and hip with immediate onset of severe pain. Was unable to get up, called son who brought patient to bed. Since then he has been unable to ambulate. Pain is constant, aching. Worse with movement or weight bearing.  ED Course: CT abd/pelvis and X ray of hip are negative for hip fracture. Does identify an L1 vertebral compression fracture without significant loss of hight, this does appear acute and patient has significant tenderness at this point on exam. He is also found to have a UTI, and a 3.2cm lung nodule / mass. Patient is able to ambulate with assistance in the ED; however, he has hypoxia to the low 80s when he does so and has to be put on O2 via [place].  Assessment and plan Acute resp failure with hypoxia - CT scan concerning for a 3 cm right upper lobe mass. Differential diagnosis mycetoma vs malignancy,PET scan recommended as outpatient. No pneumonia, currently requiring 4 L of oxygen, doubt pulmonary  embolism as the patient is therapeutic on his INR. results of the CAT scan discussed with the patient and his wife, not on home oxygen , needs aggressive pulm toilet and IS and home oxygen , patient needs outpatient pulmonary evaluation due to severe emphysema and lung  mass. Patient being set up with 4 L of oxygen at home   Hypertension-hold ACE inhibitor given now low blood pressure, cont coreg  History of pulmonary embolism-patient is supratherapeutic on his INR, has IVC filter   Acute on Chronic kidney disease stage III, creatinine at baseline of 1.6, was 1.9 on admission  Chronic systolic congestive heart failure-2-D echo 12/15/15 showed an EF of 30-35% with pulmonary hypertension,Dr. Tisovec recommended outpatient LEXI scan Myoview  Compression fx L1 Pain control PT/OT, probably not a candidate for vertebroplasty as the patient is on anticoagulation   A.Fib - continue coumadin, supratherapeutic INR advised to hold for couple of days, INR check on Monday  UTI - rocephin, follow urine culture   asymptomatic gallbladder stone- discussed CT abdomen and pelvis findings with the patient's wife and the patient, no intervention indicated at this time     Discharge Exam:    Blood pressure 110/70, pulse 72, temperature 98.1 F (36.7 C), temperature source Oral, resp. rate 20, height 5' 8"  (1.727 m), weight 78.1 kg (172 lb 2.9 oz), SpO2 98 %.  General exam: Appears calm and comfortable  Respiratory system: Clear to auscultation. Respiratory effort normal. Cardiovascular system: S1 & S2 heard, RRR. No JVD, murmurs, rubs, gallops or clicks. No pedal edema. Gastrointestinal system: Abdomen is nondistended, soft and nontender. No organomegaly or masses felt. Normal bowel sounds heard. Central nervous system: Alert and oriented. No focal neurological deficits. Extremities: Symmetric 5 x 5 power. Skin: No rashes, lesions or ulcers Psychiatry: Judgement  and insight appear normal. Mood & affect appropriate.      Follow-up Information    Follow up with [facility].   Why:  For home Oxygen, to be delivered to room prior to discharge   Contact information:   [address] [place] [address] [phone]       Follow up with Haywood Pao, MD. Schedule an appointment as soon as possible for a visit in 3 days.   Specialty:  Internal Medicine   Contact information:   [place] [address] [phone]       Signed: Reyne Dumas 02/13/2016, 9:07 AM        Time spent >45 mins

## 2016-02-13 NOTE — Progress Notes (Signed)
Physical Therapy Treatment Patient Details Name: Darrell Mendoza MRN: [id number] DOB: 1925/09/15 Today's Date: 02/13/2016    History of Present Illness Darrell Mendoza is a 82 y.o. 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 Patient presents to the ED with c/o back pain that radiates to R hip s/p fall. Patient tripped on a rug in the bathroom 2 days ago. Fell on to right chest and hip with immediate onset of severe pain.Pt found to have L1 vertebral compression fx.  While in ER, pt found to be hypoxic.    PT Comments    Patient with 4L O2 via nasal canula throughout session. Stair training complete this session. Pt currently min guard/min A for transfers and gait training. Educated on breathing and energy conservation techniques. Wife present for session and actively participating. Current plan remains appropriate.   Follow Up Recommendations  Supervision - Intermittent;Home health PT     Equipment Recommendations  None recommended by PT    Recommendations for Other Services       Precautions / Restrictions Precautions Precautions: Fall Precaution Comments: L1 compression fx Restrictions Weight Bearing Restrictions: No    Mobility  Bed Mobility Overal bed mobility: Needs Assistance Bed Mobility: Sit to Supine;Supine to Sit     Supine to sit: Min guard Sit to supine: Min assist;HOB elevated   General bed mobility comments: min A to elevate trunk into sitting with cues for technique; min guard to return to bed   Transfers Overall transfer level: Needs assistance Equipment used: Rolling walker (2 wheeled) Transfers: Sit to/from Stand              Ambulation/Gait Ambulation/Gait assistance: Min guard Ambulation Distance (Feet): 180 Feet Assistive device: Rolling walker (2 wheeled) Gait Pattern/deviations: Step-through pattern;Decreased stride length;Shuffle;Trunk  flexed;Narrow base of support     General Gait Details: 4L O2 via nasal canula throughout session; cues for increased cadence and bilat step length, and posture; 2 seated rest breaks required with pt c/o dizziness at times; pt with improved gait mechanics with increased cadence; educated on energy conservation techniques   Stairs Stairs: Yes Stairs assistance: Min assist Stair Management: Two rails;Forwards Number of Stairs: 3 General stair comments: cues for energy conservation and technique  Wheelchair Mobility    Modified Rankin (Stroke Patients Only)       Balance Overall balance assessment: Needs assistance Sitting-balance support: No upper extremity supported;Feet supported Sitting balance-Leahy Scale: Good       Standing balance-Leahy Scale: Fair                      Cognition Arousal/Alertness: Awake/alert Behavior During Therapy: WFL for tasks assessed/performed Overall Cognitive Status: Within Functional Limits for tasks assessed                      Exercises      General Comments General comments (skin integrity, edema, etc.): wife present for session       Pertinent Vitals/Pain Pain Assessment: Faces Faces Pain Scale: Hurts even more Pain Location: lumbar Pain Descriptors / Indicators: Sore Pain Intervention(s): Monitored during session;Premedicated before session;Repositioned    Home Living                      Prior Function            PT Goals (current goals can now be found in the care plan  section) Acute Rehab PT Goals Patient Stated Goal: go home Progress towards PT goals: Progressing toward goals    Frequency  Min 3X/week    PT Plan Current plan remains appropriate    Co-evaluation             End of Session Equipment Utilized During Treatment: Gait belt;Oxygen Activity Tolerance: Patient tolerated treatment well Patient left: in bed;with call bell/phone within reach;with bed alarm set;with  family/visitor present     Time: 1040-1116 PT Time Calculation (min) (ACUTE ONLY): 36 min  Charges:  $Gait Training: 8-22 mins $Therapeutic Activity: 8-22 mins                    G Codes:      [place] April, PTA Pager: [phone]   02/13/2016, 11:36 AM

## 2016-02-13 NOTE — Progress Notes (Signed)
Pt discharged home with wife, pt and wife both verbalized an understanding of discharge instructions. Pt wheeled to vehicle via wheelchair

## 2016-02-13 NOTE — Evaluation (Signed)
Occupational Therapy Evaluation Patient Details Name: Darrell Mendoza MRN: [id number] DOB: August 22, 1925 Today's Date: 02/13/2016    History of Present Illness Darrell Mendoza is a 82 y.o. 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 Patient presents to the ED with c/o back pain that radiates to R hip s/p fall. Patient tripped on a rug in the bathroom 2 days ago. Fell on to right chest and hip with immediate onset of severe pain.Pt found to have L1 vertebral compression fx.  While in ER, pt found to be hypoxic.   Clinical Impression   Pt admitted with above.  He currently requires mod A, overall for ADLs.  Pt sitting up in chair without 02 - reinforcement provided to wife and pt that he is to wear 02 at ALL times.  Both pt and wife report wife will assist him with ADLs - discussed use of AE, but they aren't interested.  Pt for discharge home today.  Recommend HHOT at discharge, and 3in1 commode.  All further OT needs can be met by [facility].      Follow Up Recommendations  Home health OT;Supervision/Assistance - 24 hour    Equipment Recommendations  3 in 1 bedside comode    Recommendations for Other Services       Precautions / Restrictions Precautions Precautions: Fall Precaution Comments: L1 compression fx Restrictions Weight Bearing Restrictions: No      Mobility Bed Mobility Overal bed mobility: Needs Assistance Bed Mobility: Sit to Supine;Supine to Sit     Supine to sit: Min guard Sit to supine: Min assist;HOB elevated   General bed mobility comments: min A to elevate trunk into sitting with cues for technique; min guard to return to bed   Transfers Overall transfer level: Needs assistance Equipment used: Rolling walker (2 wheeled) Transfers: Sit to/from [employer] Sit to Stand: Min guard Stand pivot transfers: Min guard            Balance Overall balance  assessment: Needs assistance Sitting-balance support: Feet supported Sitting balance-Leahy Scale: Good     Standing balance support: During functional activity Standing balance-Leahy Scale: Fair                              ADL Overall ADL's : Needs assistance/impaired Eating/Feeding: Independent   Grooming: Wash/dry hands;Wash/dry face;Oral care;Brushing hair;Minimal assistance;Sitting   Upper Body Bathing: Minimal assitance;Sitting   Lower Body Bathing: Moderate assistance;Sit to/from stand   Upper Body Dressing : Minimal assistance;Sitting   Lower Body Dressing: Maximal assistance;Sit to/from stand   Toilet Transfer: Min guard;Ambulation;Comfort height toilet;Grab bars;RW   Toileting- Clothing Manipulation and Hygiene: Minimal assistance;Sit to/from stand       Functional mobility during ADLs: Min guard;Rolling walker General ADL Comments: Pt requires assist due to back pain.  He has a long handled shoe horn at home.  He and wife report that wife will assist him with ADLs.  He would like a 3 in 1 commode for home use - CM notified.      Vision     Perception     Praxis      Pertinent Vitals/Pain Pain Assessment: Faces Faces Pain Scale: Hurts little more Pain Location: Rt back  Pain Descriptors / Indicators: Aching;Grimacing Pain Intervention(s): Monitored during session;Repositioned     Hand Dominance Right   Extremity/Trunk Assessment Upper Extremity Assessment Upper Extremity Assessment: Generalized weakness  Lower Extremity Assessment Lower Extremity Assessment: Defer to PT evaluation   Cervical / Trunk Assessment Cervical / Trunk Assessment: Kyphotic   Communication Communication Communication: No difficulties   Cognition Arousal/Alertness: Awake/alert Behavior During Therapy: WFL for tasks assessed/performed Overall Cognitive Status: History of cognitive impairments - at baseline       Memory: Decreased short-term memory  (anticipate this is pt's baseline )             General Comments       Exercises       Shoulder Instructions      Home Living Family/patient expects to be discharged to:: Private residence Living Arrangements: Spouse/significant other Available Help at Discharge: Available 24 hours/day;Family Type of Home: House Home Access: Stairs to enter [employer] of Steps: 4 Entrance Stairs-Rails: Right;Left;Can reach both Home Layout: One level     Bathroom Shower/Tub: Tub/shower unit;Curtain Shower/tub characteristics: Architectural technologist: Standard     Home Equipment: Environmental consultant - 2 wheels;Cane - single point;Tub bench          Prior Functioning/Environment Level of Independence: Independent;Independent with assistive device(s)        Comments: Spouse reports pt was independent with ADLs, does not drive, and has had 2 significant falls in the last 6 mos    OT Diagnosis: Generalized weakness;Cognitive deficits;Acute pain   OT Problem List: Decreased strength;Decreased activity tolerance;Impaired balance (sitting and/or standing);Decreased cognition;Decreased safety awareness;Decreased knowledge of use of DME or AE;Decreased knowledge of precautions;Cardiopulmonary status limiting activity;Pain   OT Treatment/Interventions:      OT Goals(Current goals can be found in the care plan section) Acute Rehab OT Goals Patient Stated Goal: go home OT Goal Formulation: All assessment and education complete, DC therapy  OT Frequency:     Barriers to D/C:            Co-evaluation              End of Session Equipment Utilized During Treatment: Rolling walker;Oxygen Nurse Communication: Mobility status  Activity Tolerance: Patient limited by fatigue Patient left: in chair;with call bell/phone within reach;with family/visitor present   Time: 0315-9458 OT Time Calculation (min): 17 min Charges:  OT General Charges $OT Visit: 1 Procedure OT  Evaluation $OT Eval Moderate Complexity: 1 Procedure G-Codes:    Abrish Erny M 24-Feb-2016, 12:34 PM

## 2016-02-14 LAB — URINE CULTURE

## 2016-02-15 ENCOUNTER — Telehealth (HOSPITAL_BASED_OUTPATIENT_CLINIC_OR_DEPARTMENT_OTHER): Payer: Self-pay

## 2016-02-15 NOTE — Telephone Encounter (Signed)
Post ED Visit - Positive Culture Follow-up  Culture report reviewed by antimicrobial stewardship pharmacist:  '[]'$  Elenor Quinones, Pharm.D. '[]'$  Heide Guile, Pharm.D., BCPS '[]'$  Parks Neptune, Pharm.D. '[]'$  Alycia Rossetti, Pharm.D., BCPS '[]'$  [place], [place].D., BCPS, AAHIVP '[]'$  Legrand Como, Pharm.D., BCPS, AAHIVP '[x]'$  Milus Glazier, Pharm.D. '[]'$  Rob Evette Doffing, Pharm.D.  Positive urine culture Treated with Keflex, organism sensitive to the same and no further patient follow-up is required at this time.  Genia Del 02/15/2016, 11:29 AM

## 2016-02-16 ENCOUNTER — Other Ambulatory Visit: Payer: Self-pay

## 2016-02-16 DIAGNOSIS — I10 Essential (primary) hypertension: Secondary | ICD-10-CM | POA: Diagnosis not present

## 2016-02-16 DIAGNOSIS — Z7901 Long term (current) use of anticoagulants: Secondary | ICD-10-CM | POA: Diagnosis not present

## 2016-02-16 DIAGNOSIS — I4891 Unspecified atrial fibrillation: Secondary | ICD-10-CM | POA: Diagnosis not present

## 2016-02-16 NOTE — Patient Outreach (Signed)
[facility]) Care Management  02/16/2016  Darrell Mendoza 1924-12-01 [id number]     EMMI- HF RED ON EMMI ALERT Day # 2 Date: 02/15/16 Red Alert Reason: "Scheduled follow up appointment? No"    Outreach attempt #1 to patient. Patient reached. Addressed red alert. He states that he is just returning home from seeing PCP at office. States he answered question that way as he had not completed his follow up appt at time of call. He denies any new issues or concerns. No issues with med management or transportation. Advised patient that he will continue to receive automated calls from [facility] program. He voiced understanding and was appreciative of call.    Plan: RN CM will notify [facility] administrative assistant of case status.   [name], RN,BSN,CCM [place] Management Telephonic Care Management Coordinator Direct Phone: [phone] Toll Free: [phone] Fax: [phone]

## 2016-02-18 ENCOUNTER — Other Ambulatory Visit (HOSPITAL_COMMUNITY): Payer: Self-pay | Admitting: Internal Medicine

## 2016-02-18 DIAGNOSIS — J439 Emphysema, unspecified: Secondary | ICD-10-CM | POA: Diagnosis not present

## 2016-02-18 DIAGNOSIS — I2510 Atherosclerotic heart disease of native coronary artery without angina pectoris: Secondary | ICD-10-CM | POA: Diagnosis not present

## 2016-02-18 DIAGNOSIS — D381 Neoplasm of uncertain behavior of trachea, bronchus and lung: Secondary | ICD-10-CM | POA: Diagnosis not present

## 2016-02-18 DIAGNOSIS — S32010D Wedge compression fracture of first lumbar vertebra, subsequent encounter for fracture with routine healing: Secondary | ICD-10-CM | POA: Diagnosis not present

## 2016-02-18 DIAGNOSIS — I5022 Chronic systolic (congestive) heart failure: Secondary | ICD-10-CM | POA: Diagnosis not present

## 2016-02-18 DIAGNOSIS — I4891 Unspecified atrial fibrillation: Secondary | ICD-10-CM | POA: Diagnosis not present

## 2016-02-18 DIAGNOSIS — N183 Chronic kidney disease, stage 3 (moderate): Secondary | ICD-10-CM | POA: Diagnosis not present

## 2016-02-18 DIAGNOSIS — D320 Benign neoplasm of cerebral meninges: Secondary | ICD-10-CM

## 2016-02-18 DIAGNOSIS — I130 Hypertensive heart and chronic kidney disease with heart failure and stage 1 through stage 4 chronic kidney disease, or unspecified chronic kidney disease: Secondary | ICD-10-CM | POA: Diagnosis not present

## 2016-02-18 DIAGNOSIS — R918 Other nonspecific abnormal finding of lung field: Secondary | ICD-10-CM

## 2016-02-18 DIAGNOSIS — N390 Urinary tract infection, site not specified: Secondary | ICD-10-CM | POA: Diagnosis not present

## 2016-02-19 ENCOUNTER — Other Ambulatory Visit: Payer: Self-pay

## 2016-02-19 NOTE — Patient Outreach (Signed)
[place] [facility]) Care Management  02/19/2016  Darrell Mendoza 08-Dec-1924 [id number]   Patient triggered RED on EMMI Heart Failure dashboard, notification sent to [name], RN.  Thanks, [name]. [place], [place] Assistant Phone: [phone] Fax: [phone]

## 2016-02-19 NOTE — Patient Outreach (Signed)
[facility]) Care Management  02/19/2016  Darrell Mendoza [birth year] [id number]     EMMI-HF RED ON EMMI ALERT Day # 5 Date: 02/18/16 Red Alert Reason: "weighed themselves today? No"   Outreach attempt #1 to patient. Spoke with patient. Addressed red flag alert. He reports that he has been weighing himself daily. However, sometimes when the automated calls come in the mornings he has not had a chance to weigh yet and therefore he responds with a no. He states that weight varies about a pound or two but no significant weight changes. He denies any issues with edema or SOB.   Plan: RN CM will notify [facility] administrative assistant of case status.   [name], RN,BSN,CCM [place] Management Telephonic Care Management Coordinator Direct Phone: [phone] Toll Free: [phone] Fax: [phone]

## 2016-02-20 ENCOUNTER — Telehealth: Payer: Self-pay | Admitting: Cardiology

## 2016-02-20 NOTE — Telephone Encounter (Signed)
Received records from [facility] for appointment on 03/11/16 with Dr Martinique.  Records given to [facility] (medical records) for Dr Doug Sou schedule on 03/11/16. lp

## 2016-02-23 ENCOUNTER — Institutional Professional Consult (permissible substitution): Payer: Commercial Managed Care - HMO | Admitting: Internal Medicine

## 2016-02-23 ENCOUNTER — Other Ambulatory Visit: Payer: Self-pay

## 2016-02-23 DIAGNOSIS — N390 Urinary tract infection, site not specified: Secondary | ICD-10-CM | POA: Diagnosis not present

## 2016-02-23 DIAGNOSIS — N183 Chronic kidney disease, stage 3 (moderate): Secondary | ICD-10-CM | POA: Diagnosis not present

## 2016-02-23 DIAGNOSIS — D381 Neoplasm of uncertain behavior of trachea, bronchus and lung: Secondary | ICD-10-CM | POA: Diagnosis not present

## 2016-02-23 DIAGNOSIS — J439 Emphysema, unspecified: Secondary | ICD-10-CM | POA: Diagnosis not present

## 2016-02-23 DIAGNOSIS — I5022 Chronic systolic (congestive) heart failure: Secondary | ICD-10-CM | POA: Diagnosis not present

## 2016-02-23 DIAGNOSIS — I2510 Atherosclerotic heart disease of native coronary artery without angina pectoris: Secondary | ICD-10-CM | POA: Diagnosis not present

## 2016-02-23 DIAGNOSIS — I4891 Unspecified atrial fibrillation: Secondary | ICD-10-CM | POA: Diagnosis not present

## 2016-02-23 DIAGNOSIS — S32010D Wedge compression fracture of first lumbar vertebra, subsequent encounter for fracture with routine healing: Secondary | ICD-10-CM | POA: Diagnosis not present

## 2016-02-23 DIAGNOSIS — I130 Hypertensive heart and chronic kidney disease with heart failure and stage 1 through stage 4 chronic kidney disease, or unspecified chronic kidney disease: Secondary | ICD-10-CM | POA: Diagnosis not present

## 2016-02-23 NOTE — Patient Outreach (Signed)
[facility]) Care Management  02/23/2016  [place] [birth year] [id number]      EMMI-HF RED ON EMMI ALERT Day # 02/21/16 Date: 8 Red Alert Reason: " New/worsening problems? Yes   Went to follow up appt? No I missed it"    Outreach attempt #1 to patient. Spoke with patient. Addressed red alerts. Patient reports he is doing better today. He voices having not felt well over the weekend. He attributes it to bad case of diarrhea. He denies any other GI symptoms. Patient reports he feels it was a side effect from"the green pill" he was taken. He has finished the med(possibly antibiotic-he is unable to recall med name). Symptoms have resolved and are improving. Discussed importance of fluid replacemnt and hydration. Advised patient to notify MD if symptoms return and/or worsen. He voiced understanding. He states that his follow up with Dr. Martinique was rescheduled for 03/11/16. Denies any other RN CM needs or concerns at this time.    Plan: RN CM will notify [facility] administrative assistant of case status.   [name], RN,BSN,CCM [place] Management Telephonic Care Management Coordinator Direct Phone: [phone] Toll Free: [phone] Fax: [phone]

## 2016-02-23 NOTE — Patient Outreach (Signed)
[place] [facility]) Care Management  02/23/2016  Darrell Mendoza 20-Jul-1925 [id number]   Patient triggered RED on EMMI Heart Failure dashboard, notification sent to [name], RN.  Thanks, [name]. [place], [place] Assistant Phone: [phone] Fax: [phone]

## 2016-02-25 DIAGNOSIS — I2510 Atherosclerotic heart disease of native coronary artery without angina pectoris: Secondary | ICD-10-CM | POA: Diagnosis not present

## 2016-02-25 DIAGNOSIS — N390 Urinary tract infection, site not specified: Secondary | ICD-10-CM | POA: Diagnosis not present

## 2016-02-25 DIAGNOSIS — D381 Neoplasm of uncertain behavior of trachea, bronchus and lung: Secondary | ICD-10-CM | POA: Diagnosis not present

## 2016-02-25 DIAGNOSIS — N183 Chronic kidney disease, stage 3 (moderate): Secondary | ICD-10-CM | POA: Diagnosis not present

## 2016-02-25 DIAGNOSIS — I130 Hypertensive heart and chronic kidney disease with heart failure and stage 1 through stage 4 chronic kidney disease, or unspecified chronic kidney disease: Secondary | ICD-10-CM | POA: Diagnosis not present

## 2016-02-25 DIAGNOSIS — S32010D Wedge compression fracture of first lumbar vertebra, subsequent encounter for fracture with routine healing: Secondary | ICD-10-CM | POA: Diagnosis not present

## 2016-02-25 DIAGNOSIS — I4891 Unspecified atrial fibrillation: Secondary | ICD-10-CM | POA: Diagnosis not present

## 2016-02-25 DIAGNOSIS — I5022 Chronic systolic (congestive) heart failure: Secondary | ICD-10-CM | POA: Diagnosis not present

## 2016-02-25 DIAGNOSIS — J439 Emphysema, unspecified: Secondary | ICD-10-CM | POA: Diagnosis not present

## 2016-02-26 ENCOUNTER — Ambulatory Visit (HOSPITAL_COMMUNITY): Payer: Commercial Managed Care - HMO

## 2016-02-27 ENCOUNTER — Telehealth: Payer: Self-pay | Admitting: Cardiology

## 2016-02-27 NOTE — Telephone Encounter (Signed)
Spoke with Pt's spouse, ok per DPR, she was asking if her husband needed blood work prior to his appt. Looked at Dr Doug Sou last office note and he did order pt to have blood work prior to appt. Order is in [place].  Pt/spouse aware.

## 2016-02-27 NOTE — Telephone Encounter (Signed)
NewMessage  Pt wife calling to speak w/ NR concerning possible lab work before June appt w/ Dr Martinique; Please call back and discuss.

## 2016-03-01 DIAGNOSIS — I130 Hypertensive heart and chronic kidney disease with heart failure and stage 1 through stage 4 chronic kidney disease, or unspecified chronic kidney disease: Secondary | ICD-10-CM | POA: Diagnosis not present

## 2016-03-01 DIAGNOSIS — I4891 Unspecified atrial fibrillation: Secondary | ICD-10-CM | POA: Diagnosis not present

## 2016-03-01 DIAGNOSIS — N183 Chronic kidney disease, stage 3 (moderate): Secondary | ICD-10-CM | POA: Diagnosis not present

## 2016-03-01 DIAGNOSIS — I5022 Chronic systolic (congestive) heart failure: Secondary | ICD-10-CM | POA: Diagnosis not present

## 2016-03-01 DIAGNOSIS — D381 Neoplasm of uncertain behavior of trachea, bronchus and lung: Secondary | ICD-10-CM | POA: Diagnosis not present

## 2016-03-01 DIAGNOSIS — N390 Urinary tract infection, site not specified: Secondary | ICD-10-CM | POA: Diagnosis not present

## 2016-03-01 DIAGNOSIS — I2510 Atherosclerotic heart disease of native coronary artery without angina pectoris: Secondary | ICD-10-CM | POA: Diagnosis not present

## 2016-03-01 DIAGNOSIS — J439 Emphysema, unspecified: Secondary | ICD-10-CM | POA: Diagnosis not present

## 2016-03-01 DIAGNOSIS — S32010D Wedge compression fracture of first lumbar vertebra, subsequent encounter for fracture with routine healing: Secondary | ICD-10-CM | POA: Diagnosis not present

## 2016-03-02 ENCOUNTER — Encounter (HOSPITAL_COMMUNITY)
Admission: RE | Admit: 2016-03-02 | Discharge: 2016-03-02 | Disposition: A | Discharge status: Home / Self Care | Payer: Commercial Managed Care - HMO | Source: Ambulatory Visit | Attending: Internal Medicine | Admitting: Internal Medicine

## 2016-03-02 DIAGNOSIS — R918 Other nonspecific abnormal finding of lung field: Secondary | ICD-10-CM

## 2016-03-02 DIAGNOSIS — D320 Benign neoplasm of cerebral meninges: Secondary | ICD-10-CM

## 2016-03-02 DIAGNOSIS — G43909 Migraine, unspecified, not intractable, without status migrainosus: Secondary | ICD-10-CM | POA: Diagnosis not present

## 2016-03-02 DIAGNOSIS — J984 Other disorders of lung: Secondary | ICD-10-CM | POA: Diagnosis not present

## 2016-03-02 LAB — GLUCOSE, CAPILLARY: Glucose-Capillary: 108 mg/dL — ABNORMAL HIGH (ref 65–99)

## 2016-03-02 MED ORDER — FLUDEOXYGLUCOSE F - 18 (FDG) INJECTION
8.4000 | Freq: Once | INTRAVENOUS | Status: AC | PRN
  Administered 2016-03-02: 8.4 via INTRAVENOUS

## 2016-03-03 DIAGNOSIS — N183 Chronic kidney disease, stage 3 (moderate): Secondary | ICD-10-CM | POA: Diagnosis not present

## 2016-03-03 DIAGNOSIS — Z86711 Personal history of pulmonary embolism: Secondary | ICD-10-CM | POA: Diagnosis not present

## 2016-03-03 DIAGNOSIS — S32010D Wedge compression fracture of first lumbar vertebra, subsequent encounter for fracture with routine healing: Secondary | ICD-10-CM | POA: Diagnosis not present

## 2016-03-03 DIAGNOSIS — I4891 Unspecified atrial fibrillation: Secondary | ICD-10-CM | POA: Diagnosis not present

## 2016-03-03 DIAGNOSIS — D381 Neoplasm of uncertain behavior of trachea, bronchus and lung: Secondary | ICD-10-CM | POA: Diagnosis not present

## 2016-03-03 DIAGNOSIS — N390 Urinary tract infection, site not specified: Secondary | ICD-10-CM | POA: Diagnosis not present

## 2016-03-03 DIAGNOSIS — J439 Emphysema, unspecified: Secondary | ICD-10-CM | POA: Diagnosis not present

## 2016-03-03 DIAGNOSIS — I2510 Atherosclerotic heart disease of native coronary artery without angina pectoris: Secondary | ICD-10-CM | POA: Diagnosis not present

## 2016-03-03 DIAGNOSIS — Z7901 Long term (current) use of anticoagulants: Secondary | ICD-10-CM | POA: Diagnosis not present

## 2016-03-03 DIAGNOSIS — I130 Hypertensive heart and chronic kidney disease with heart failure and stage 1 through stage 4 chronic kidney disease, or unspecified chronic kidney disease: Secondary | ICD-10-CM | POA: Diagnosis not present

## 2016-03-03 DIAGNOSIS — I5022 Chronic systolic (congestive) heart failure: Secondary | ICD-10-CM | POA: Diagnosis not present

## 2016-03-05 DIAGNOSIS — I4891 Unspecified atrial fibrillation: Secondary | ICD-10-CM | POA: Diagnosis not present

## 2016-03-05 DIAGNOSIS — D381 Neoplasm of uncertain behavior of trachea, bronchus and lung: Secondary | ICD-10-CM | POA: Diagnosis not present

## 2016-03-05 DIAGNOSIS — I130 Hypertensive heart and chronic kidney disease with heart failure and stage 1 through stage 4 chronic kidney disease, or unspecified chronic kidney disease: Secondary | ICD-10-CM | POA: Diagnosis not present

## 2016-03-05 DIAGNOSIS — N390 Urinary tract infection, site not specified: Secondary | ICD-10-CM | POA: Diagnosis not present

## 2016-03-05 DIAGNOSIS — S32010D Wedge compression fracture of first lumbar vertebra, subsequent encounter for fracture with routine healing: Secondary | ICD-10-CM | POA: Diagnosis not present

## 2016-03-05 DIAGNOSIS — J439 Emphysema, unspecified: Secondary | ICD-10-CM | POA: Diagnosis not present

## 2016-03-05 DIAGNOSIS — I2510 Atherosclerotic heart disease of native coronary artery without angina pectoris: Secondary | ICD-10-CM | POA: Diagnosis not present

## 2016-03-05 DIAGNOSIS — N183 Chronic kidney disease, stage 3 (moderate): Secondary | ICD-10-CM | POA: Diagnosis not present

## 2016-03-05 DIAGNOSIS — I5022 Chronic systolic (congestive) heart failure: Secondary | ICD-10-CM | POA: Diagnosis not present

## 2016-03-08 DIAGNOSIS — I2510 Atherosclerotic heart disease of native coronary artery without angina pectoris: Secondary | ICD-10-CM | POA: Diagnosis not present

## 2016-03-08 DIAGNOSIS — I130 Hypertensive heart and chronic kidney disease with heart failure and stage 1 through stage 4 chronic kidney disease, or unspecified chronic kidney disease: Secondary | ICD-10-CM | POA: Diagnosis not present

## 2016-03-08 DIAGNOSIS — J439 Emphysema, unspecified: Secondary | ICD-10-CM | POA: Diagnosis not present

## 2016-03-08 DIAGNOSIS — I5022 Chronic systolic (congestive) heart failure: Secondary | ICD-10-CM | POA: Diagnosis not present

## 2016-03-08 DIAGNOSIS — N390 Urinary tract infection, site not specified: Secondary | ICD-10-CM | POA: Diagnosis not present

## 2016-03-08 DIAGNOSIS — N183 Chronic kidney disease, stage 3 (moderate): Secondary | ICD-10-CM | POA: Diagnosis not present

## 2016-03-08 DIAGNOSIS — S32010D Wedge compression fracture of first lumbar vertebra, subsequent encounter for fracture with routine healing: Secondary | ICD-10-CM | POA: Diagnosis not present

## 2016-03-08 DIAGNOSIS — D381 Neoplasm of uncertain behavior of trachea, bronchus and lung: Secondary | ICD-10-CM | POA: Diagnosis not present

## 2016-03-08 DIAGNOSIS — I4891 Unspecified atrial fibrillation: Secondary | ICD-10-CM | POA: Diagnosis not present

## 2016-03-09 DIAGNOSIS — I428 Other cardiomyopathies: Secondary | ICD-10-CM | POA: Diagnosis not present

## 2016-03-09 DIAGNOSIS — I4891 Unspecified atrial fibrillation: Secondary | ICD-10-CM | POA: Diagnosis not present

## 2016-03-09 DIAGNOSIS — I509 Heart failure, unspecified: Secondary | ICD-10-CM | POA: Diagnosis not present

## 2016-03-09 DIAGNOSIS — I429 Cardiomyopathy, unspecified: Secondary | ICD-10-CM | POA: Diagnosis not present

## 2016-03-09 DIAGNOSIS — I25118 Atherosclerotic heart disease of native coronary artery with other forms of angina pectoris: Secondary | ICD-10-CM | POA: Diagnosis not present

## 2016-03-09 DIAGNOSIS — I5022 Chronic systolic (congestive) heart failure: Secondary | ICD-10-CM | POA: Diagnosis not present

## 2016-03-09 DIAGNOSIS — E785 Hyperlipidemia, unspecified: Secondary | ICD-10-CM | POA: Diagnosis not present

## 2016-03-09 DIAGNOSIS — I10 Essential (primary) hypertension: Secondary | ICD-10-CM | POA: Diagnosis not present

## 2016-03-10 DIAGNOSIS — J439 Emphysema, unspecified: Secondary | ICD-10-CM | POA: Diagnosis not present

## 2016-03-10 DIAGNOSIS — I5022 Chronic systolic (congestive) heart failure: Secondary | ICD-10-CM | POA: Diagnosis not present

## 2016-03-10 DIAGNOSIS — S32010D Wedge compression fracture of first lumbar vertebra, subsequent encounter for fracture with routine healing: Secondary | ICD-10-CM | POA: Diagnosis not present

## 2016-03-10 DIAGNOSIS — I4891 Unspecified atrial fibrillation: Secondary | ICD-10-CM | POA: Diagnosis not present

## 2016-03-10 DIAGNOSIS — N183 Chronic kidney disease, stage 3 (moderate): Secondary | ICD-10-CM | POA: Diagnosis not present

## 2016-03-10 DIAGNOSIS — D381 Neoplasm of uncertain behavior of trachea, bronchus and lung: Secondary | ICD-10-CM | POA: Diagnosis not present

## 2016-03-10 DIAGNOSIS — I2510 Atherosclerotic heart disease of native coronary artery without angina pectoris: Secondary | ICD-10-CM | POA: Diagnosis not present

## 2016-03-10 DIAGNOSIS — I130 Hypertensive heart and chronic kidney disease with heart failure and stage 1 through stage 4 chronic kidney disease, or unspecified chronic kidney disease: Secondary | ICD-10-CM | POA: Diagnosis not present

## 2016-03-10 DIAGNOSIS — N390 Urinary tract infection, site not specified: Secondary | ICD-10-CM | POA: Diagnosis not present

## 2016-03-10 LAB — BASIC METABOLIC PANEL
BUN: 28 mg/dL — AB (ref 7–25)
CALCIUM: 8.6 mg/dL (ref 8.6–10.3)
CHLORIDE: 105 mmol/L (ref 98–110)
CO2: 25 mmol/L (ref 20–31)
CREATININE: 1.33 mg/dL — AB (ref 0.70–1.11)
Glucose, Bld: 79 mg/dL (ref 65–99)
Potassium: 5.5 mmol/L — ABNORMAL HIGH (ref 3.5–5.3)
Sodium: 139 mmol/L (ref 135–146)

## 2016-03-11 ENCOUNTER — Encounter: Payer: Self-pay | Admitting: Cardiology

## 2016-03-11 ENCOUNTER — Ambulatory Visit (INDEPENDENT_AMBULATORY_CARE_PROVIDER_SITE_OTHER): Payer: Commercial Managed Care - HMO | Admitting: Cardiology

## 2016-03-11 VITALS — BP 118/70 | HR 66 | Ht 67.0 in | Wt 164.2 lb

## 2016-03-11 DIAGNOSIS — I4891 Unspecified atrial fibrillation: Secondary | ICD-10-CM | POA: Diagnosis not present

## 2016-03-11 DIAGNOSIS — I2510 Atherosclerotic heart disease of native coronary artery without angina pectoris: Secondary | ICD-10-CM

## 2016-03-11 DIAGNOSIS — I5022 Chronic systolic (congestive) heart failure: Secondary | ICD-10-CM | POA: Diagnosis not present

## 2016-03-11 DIAGNOSIS — N390 Urinary tract infection, site not specified: Secondary | ICD-10-CM | POA: Diagnosis not present

## 2016-03-11 DIAGNOSIS — N183 Chronic kidney disease, stage 3 (moderate): Secondary | ICD-10-CM | POA: Diagnosis not present

## 2016-03-11 DIAGNOSIS — I10 Essential (primary) hypertension: Secondary | ICD-10-CM | POA: Diagnosis not present

## 2016-03-11 DIAGNOSIS — I482 Chronic atrial fibrillation, unspecified: Secondary | ICD-10-CM

## 2016-03-11 DIAGNOSIS — J439 Emphysema, unspecified: Secondary | ICD-10-CM | POA: Diagnosis not present

## 2016-03-11 DIAGNOSIS — I130 Hypertensive heart and chronic kidney disease with heart failure and stage 1 through stage 4 chronic kidney disease, or unspecified chronic kidney disease: Secondary | ICD-10-CM | POA: Diagnosis not present

## 2016-03-11 DIAGNOSIS — D381 Neoplasm of uncertain behavior of trachea, bronchus and lung: Secondary | ICD-10-CM | POA: Diagnosis not present

## 2016-03-11 DIAGNOSIS — S32010D Wedge compression fracture of first lumbar vertebra, subsequent encounter for fracture with routine healing: Secondary | ICD-10-CM | POA: Diagnosis not present

## 2016-03-11 NOTE — Patient Instructions (Signed)
Continue your current therapy  I will see you in 6 months.   

## 2016-03-11 NOTE — Progress Notes (Signed)
Darrell Mendoza Date of Birth: 1925/08/03 Medical Record #[id number]  History of Present Illness: Darrell Mendoza is seen for follow up of CAD, Afib, and LV dysfunction. He has a history of coronary disease and underwent stenting of the mid right coronary in 2004 with a 3.0 x 23 mm Cypher stent.  Prior stress test was in December of 2010 showed a basal to mid inferior wall scar without ischemia and ejection fraction 61%. His medical history is significant for a DVT with pulmonary emboli. He is on chronic Coumadin therapy. Earlier this year he was found to be in  Afib- new onset. He was asymptomatic. Rate was OK. He had an Echo done that showed EF 30-35%. Myoview study showed inferior scar without ischemia. He was started on low dose lisinopril 5 mg daily but did not tolerate this due to hyperkalemia and renal insufficiency.  In May he was admitted with acute respiratory failure and hypoxia. He also had a fall with vertebral fracture. CT of the head showed finding of a a mass consistent with a large meningioma. He also had an abnormal CXR and CT showing a 3.5 cm mass in RUL. He has since had a cranial MRI and PET CT as noted below. On follow up he denies any SOB or cough. No edema. No chest pain. Still weak and having back pain.   Current Outpatient Prescriptions on File Prior to Visit  Medication Sig Dispense Refill  . calcium-vitamin D (OSCAL WITH D) 250-125 MG-UNIT per tablet Take 1 tablet by mouth daily.    . carvedilol (COREG) 3.125 MG tablet Take 1 tablet (3.125 mg total) by mouth 2 (two) times daily. 180 tablet 3  . cephALEXin (KEFLEX) 500 MG capsule Take 1 capsule (500 mg total) by mouth 2 (two) times daily. 20 capsule 0  . finasteride (PROSCAR) 5 MG tablet Take 5 mg by mouth daily.    . fish oil-omega-3 fatty acids 1000 MG capsule Take 2 g by mouth daily.    . Multiple Vitamin (MULTIVITAMIN) tablet Take 1 tablet by mouth daily.    . pravastatin (PRAVACHOL) 40 MG tablet Take 40 mg by mouth  daily.    . Tamsulosin HCl (FLOMAX) 0.4 MG CAPS Take 0.4 mg by mouth daily.    . vitamin D, CHOLECALCIFEROL, 400 UNITS tablet Take 400 Units by mouth daily.    Marland Kitchen warfarin (COUMADIN) 5 MG tablet Take 0.5-1 tablets (2.5-5 mg total) by mouth daily. Take 2.5 mg Sun / Tues / Wed / Thurs / Sat Take 5 mg on Mon / Fri 30 tablet 0   No current facility-administered medications on file prior to visit.    No Known Allergies  Past Medical History  Diagnosis Date  . Pulmonary embolism ([place])     05/2011  . Hypotension   . Benign prostatic hypertrophy   . Greenfield filter in place     History of chronic lower extremity clots  . Deep vein thrombosis ([facility])     Coumadin anticoagulation for deep vein thrombosis  . Hyperlipidemia   . Hypertension      History of hypertension  . Lightheadedness   . Premature ventricular contractions   . Hand arthritis   . Coronary artery disease     s/p 3.0x23 cypher mid rca 2004  . 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 Bilateral cataracts   . Atrial fibrillation ([place]) 12/11/2015  . CKD (chronic kidney disease) stage 3, GFR 30-59 ml/min  Past Surgical History  Procedure Laterality Date  . Stomach surgery  1972    for a hernia  . Rotator cuff repair  1985    right rotator cuff surgery  . Hip surgery  2001-2001    right hip surgery  . Cardiac catheterization  10/22/2002    left heart cath  . Excison toenail Right     HALLUX    History  Smoking status  . Former Smoker  Smokeless tobacco  . Former Systems developer  . Quit date: 10/20/1983    History  Alcohol Use  . Yes    Comment: very little    Family History  Problem Relation Age of Onset  . Tuberculosis Mother     Review of Systems: As noted per history of present illness.  All other systems were reviewed and are negative.  Physical Exam: BP 118/70 mmHg  Pulse 66  Ht '5\' 7"'$  (1.702 m)  Wt 74.503 kg (164 lb 4 oz)  BMI 25.72 kg/m2 He is a pleasant elderly white male in no acute  distress. The skin is warm and dry.   The HEENT exam is normal. The carotids are 2+ without bruits.  There is no thyromegaly.  There is no JVD.  The lungs are clear.    The heart exam reveals an irregular rate and rhythm and a normal S1 and S2.  There are no murmurs, gallops, or rubs.  The PMI is not displaced.   Abdominal exam reveals good bowel sounds.  There is no hepatosplenomegaly or tenderness.  There are no masses.  Exam of the legs reveal no clubbing, cyanosis, or edema.   The distal pulses are intact.  Cranial nerves II - XII are intact.  Motor and sensory functions are intact.  The gait is normal.  LABORATORY DATA: Echo: 12/15/15:Study Conclusions  - Left ventricle: The cavity size was normal. There was mild  concentric hypertrophy. Systolic function was moderately to  severely reduced. The estimated ejection fraction was in the  range of 30% to 35%. Diffuse hypokinesis. - Aortic valve: Valve mobility was mildly restricted. There was  very mild stenosis. There was mild regurgitation. Peak velocity  (S): 216.21 cm/s. Mean gradient (S): 10 mm Hg. Valve area (VTI):  1.84 cm^2. Valve area (Vmax): 1.98 cm^2. Valve area (Vmean): 1.83  cm^2. - Mitral valve: Transvalvular velocity was within the normal range.  There was no evidence for stenosis. There was mild to moderate  regurgitation. - Left atrium: The atrium was severely dilated. - Right ventricle: The cavity size was normal. Wall thickness was  normal. Systolic function was normal. - Tricuspid valve: There was moderate regurgitation. - Pulmonic valve: There was trivial regurgitation. - Pulmonary arteries: Systolic pressure was moderately increased.  PA peak pressure: 47 mm Hg (S). - Inferior vena cava: The vessel was normal in size. The  respirophasic diameter changes were in the normal range (>= 50%),  consistent with normal central venous pressure.  Myoview 12/24/15:  Study Highlights     There was no ST segment  deviation noted during stress.  Defect 1: There is a medium defect of severe severity present in the basal inferoseptal, basal inferior, mid inferoseptal and mid inferior location.  Findings consistent with prior myocardial infarction with very mild peri-infarct ischemia in the inferior wall.  This is a low risk study.   CT CHEST WITHOUT CONTRAST  TECHNIQUE: Multidetector CT imaging of the chest was performed following the standard protocol without IV contrast.  COMPARISON: Chest radiograph -  earlier same day  FINDINGS: Opacity seen on preceding chest radiograph correlates with an approximately 2.8 x 3.0 cm macro lobular mass (image 52, series 205) which is noted to be located within the nondependent portion of a large bleb within the right upper lobe.  Severe paraseptal emphysematous change. No pneumothorax.  Rather extensive reticular opacities within the bilateral lung bases then about the bilateral costophrenic angles without definitive honeycombing. No focal airspace opacities. No pleural effusion or pneumothorax. The central pulmonary airways remain patent.  No additional discrete pulmonary nodules given extensive background parenchymal abnormalities.  Right hilar adenopathy with index right suprahilar lymph node measuring 1.3 cm in greatest short axis diameter (image 59, series 201). Additional scattered mediastinal lymph nodes are numerous though individually not enlarged by size criteria with index precarinal lymph node measuring 0.9 cm in greatest short axis diameter (50, series 201). No axillary lymphadenopathy.  Cardiomegaly. Small amount of pericardial fluid, presumably physiologic. Coronary artery calcifications. Calcifications within the aortic annulus and suspected within the aortic leaflets. Scattered atherosclerotic plaque with a normal caliber thoracic aorta.  Limited noncontrast evaluation of the upper abdomen demonstrates the laminated  gallstone within the gallbladder. The bilateral kidneys appear atrophic.  No acute or aggressive osseous abnormalities. Serpiginous lucency involving the superior endplate of the L1 vertebral body without associated fracture line or vertebral body height loss.  Regional soft tissues appear normal. Normal noncontrast appearance of the thyroid gland.  IMPRESSION: 1. Opacity seen on preceding chest radiograph correlates with a approximately 3 cm macular lobular mass within the right upper lobe located within the nondependent portion of a large bleb. Differential considerations include a mycetoma (favored) though malignancy is not excluded on the basis of this examination. Further evaluation with PET scan could be performed as indicated. 2. Solitary right hilar lymph node nonspecific and while potentially reactive given advanced parenchymal abnormalities, attention on follow-up is recommended. 3. Advanced paraseptal emphysematous change. 4. Rather extensive reticular opacities within the bilateral lung bases and costophrenic angles without evidence of honeycombing. 5. Cardiomegaly. Atherosclerosis including coronary artery calcifications. 6. Cholelithiasis. 7. Serpiginous lucency involving the superior endplate of the L1 vertebral body without associated fracture line or vertebral body height loss is nonspecific though a potential developing acute compression fracture could have this appearance. Correlation for tenderness at this location is recommended. Further evaluation with MRI could be performed as indicated.   Electronically Signed  By: Sandi Mariscal M.D.  On: 02/11/2016 22:35   NUCLEAR MEDICINE PET SKULL BASE TO THIGH  TECHNIQUE: 8.4 mCi F-18 FDG was injected intravenously. Full-ring PET imaging was performed from the skull base to thigh after the radiotracer. CT data was obtained and used for attenuation correction and anatomic localization.  FASTING BLOOD  GLUCOSE: Value: 108 mg/dl  COMPARISON: Chest CT of 02/11/2016. Abdominal pelvic CT of 02/11/2016  FINDINGS: NECK  No areas of abnormal hypermetabolism.  CHEST  Right upper lobe lung mass, positioned within an underlying bulla. This measures 3.3 x 3.4 cm and a S.U.V. max of 64.3 on image 28/series 3. Compare 3.0 x 2.8 cm on the prior exam (when remeasured).  Precarinal node measures 1.3 cm and a S.U.V. max of 6.6.  Right hilar hypermetabolism corresponding to adenopathy. This measures 1.4 cm and a S.U.V. max of 7.6 on image 69/series 4.  ABDOMEN/PELVIS  No areas of abnormal hypermetabolism.  SKELETON  Rotator cuff activity is likely degenerative bilaterally.  Hypermetabolism which corresponds toat L1 mild compression deformity. The activity is identified throughout the vertebral body, measuring  a S.U.V. max of 9.2. There also areas of vague hypermetabolism involving the T10 and T11 vertebral bodies.  CT IMAGES PERFORMED FOR ATTENUATION CORRECTION  Partially calcified right-sided intracranial lesion with adjacent vasogenic edema. Better evaluated on prior MRI.  Surgical changes of both globes. No cervical adenopathy. Cardiomegaly. Multivessel coronary artery atherosclerosis. Moderate hiatal hernia. Advanced bullous type emphysema. Bilateral renal scarring and cortical thinning. Lower pole right renal low-density lesion is likely a cyst. Gallstones. IVC filter. Right hip arthroplasty. Osteopenia.  IMPRESSION: 1. Hypermetabolic right upper lobe lung mass, positioned within an underlying bulla. Although this could represent a mycetoma,it is suspicious for a primary bronchogenic carcinoma. 2. Mediastinal and right hilar hypermetabolic nodes, suspicious for metastatic disease. 3. No extrathoracic soft tissue metastasis identified. 4. L1 hypermetabolism corresponding to a mild compression deformity. This could be due to osteopenia or an underlying  osseous metastasis. More vague T10 and T11 hypermetabolism is indeterminate. If the patient is diagnosed with primary bronchogenic carcinoma, pre and postcontrast thoracolumbar spine MRI may be informative. 5. Incidental findings, including hiatal hernia, cholelithiasis, and advanced atherosclerosis.   Electronically Signed  By: Abigail Miyamoto M.D.  On: 03/02/2016 11:58  CT HEAD WITHOUT CONTRAST  TECHNIQUE: Contiguous axial images were obtained from the base of the skull through the vertex without intravenous contrast.  COMPARISON: None.  FINDINGS: Moderate age related cortical and deep atrophy. Mild changes of small vessel disease of the white matter. Partially calcified likely extra-axial mass arising from the right temporal bone measuring maximally approximately 1.5 x 4.0 cm, with mass effect on the right temporal lobe and edema in the white matter of the right temporal lobe. As a result, the temporal horn of the right lateral ventricle is likely entrapped. No midline shift. No acute hemorrhage or hematoma.  No skull fracture or other focal osseous abnormality involving the skull. Visualized paranasal sinuses, bilateral mastoid air cells and bilateral middle ear cavities well-aerated. Severe bilateral carotid siphon and vertebral artery atherosclerosis.  IMPRESSION: 1. Partially calcified likely meningioma arising from the right temporal bone with maximum measurements approximating 1.5 x 4.0 cm. The likely meningioma causes mass effect upon the right temporal lobe with edema in the white matter and likely entrapment of the temporal horn of the right lateral ventricle. 2. No acute intracranial abnormality. 3. Moderate age related cortical and deep atrophy and mild chronic microvascular ischemic changes of the white matter. A non-emergent MRI of the brain may be helpful in further evaluation to confirm the likely dural origin of the right temporal  mass.   Electronically Signed  By: Evangeline Dakin M.D.  On: 02/11/2016 17:12  MRI HEAD WITHOUT CONTRAST  TECHNIQUE: Multiplanar, multiecho pulse sequences of the brain and surrounding structures were obtained without intravenous contrast.  COMPARISON: 02/11/2016 head CT.  FINDINGS: No acute infarct or intracranial hemorrhage.  Extra-axial heterogeneous 5.8 x 2.3 x 4.1 cm mass spans from the anterior lateral right middle cranial fossa superiorly along the right pterion. This abuts the right middle cerebral artery. Compression of the adjacent right frontal and temporal lobe. Within the anterior compressed right temporal lobe there is a 2 x 2 x 2.1 cm cyst and vasogenic edema.  The appearance is most consistent with a partially calcified meningioma with adjacent right temporal lobe cystic changes and vasogenic edema. If the patient's lung mass proves to be malignant than dural metastatic disease cannot be entirely excluded although felt to be a secondary less likely consideration based on MR imaging  Remote small cerebellar infarcts greater on  the left.  Moderate chronic microvascular changes.  Global atrophy without hydrocephalus.  Diminutive size left vertebral artery. Patent right vertebral artery, basilar artery, internal carotid arteries and major dural sinuses.  Post lens replacement. Enophthalmos.  C3-4 cervical spondylotic changes with slight cord flattening.  IMPRESSION: Extra-axial heterogeneous 5.8 x 2.3 x 4.1 cm mass spans from the anterior lateral right middle cranial fossa superiorly along the right pterion. This abuts the right middle cerebral artery. Compression of the adjacent right frontal and temporal lobe. Within the anterior compressed right temporal lobe there is a 2 x 2 x 2.1 cm cyst and vasogenic edema.  The appearance is most consistent with a partially calcified meningioma with adjacent right temporal lobe cystic  changes and vasogenic edema. If the patient's lung mass proves to be malignant than dural metastatic disease cannot be entirely excluded although felt to be a secondary less likely consideration based on MR imaging. Please no present exam was performed without contrast secondary to renal insufficiency.  Remote small cerebellar infarcts greater on the left.  Moderate chronic microvascular changes.  Global atrophy without hydrocephalus.  Diminutive size left vertebral artery.  C3-4 cervical spondylotic changes with slight cord flattening.   Electronically Signed  By: Genia Del M.D.  On: 03/02/2016 10:21  Assessment / Plan: 1. Coronary disease status post stenting of the right coronary in 2004 with a Cypher stent. He is asymptomatic. Myoview showed inferior scar without ischemia. Will continue low dose Coreg and anticoagulation.  2. History of DVT with pulmonary emboli. On chronic anticoagulation with coumadin.  3. Hypertension. Blood pressure is well controlled.  4. Chronic PVCs. Not symptomatic.   5. Atrial fibrillation.  unknown duration. Patient is asymptomatic and rate is controlled on no rate slowing medication. He is already anticoagulated on coumadin.   6. Mixed ischemic/nonischemic cardiomyopathy. EF 30-35%. No overt CHF. Intolerant of low dose ACEi due to AKI and hyperkalemia. Continue Coreg 3.125 mg bid.   7. CKD stage 3.   8. RUL lung mass. PET CT  Most consistent with bronchogenic CA. Hypermetabolic hilar and mediastinal lymph nodes would indicate this is not resectable. He has appointment with pulmonary to discuss further.  9. Brain mass. Most likely meningioma. No clear symptoms. Not a candidate for resection given other medical problems and age.

## 2016-03-12 DIAGNOSIS — Z7901 Long term (current) use of anticoagulants: Secondary | ICD-10-CM | POA: Diagnosis not present

## 2016-03-12 DIAGNOSIS — I4891 Unspecified atrial fibrillation: Secondary | ICD-10-CM | POA: Diagnosis not present

## 2016-03-13 DIAGNOSIS — Z7901 Long term (current) use of anticoagulants: Secondary | ICD-10-CM | POA: Diagnosis not present

## 2016-03-13 DIAGNOSIS — Z86711 Personal history of pulmonary embolism: Secondary | ICD-10-CM | POA: Diagnosis not present

## 2016-03-13 DIAGNOSIS — Z9861 Coronary angioplasty status: Secondary | ICD-10-CM | POA: Diagnosis not present

## 2016-03-13 DIAGNOSIS — N401 Enlarged prostate with lower urinary tract symptoms: Secondary | ICD-10-CM | POA: Diagnosis not present

## 2016-03-13 DIAGNOSIS — E7800 Pure hypercholesterolemia, unspecified: Secondary | ICD-10-CM | POA: Diagnosis not present

## 2016-03-13 DIAGNOSIS — J849 Interstitial pulmonary disease, unspecified: Secondary | ICD-10-CM | POA: Diagnosis not present

## 2016-03-13 DIAGNOSIS — R0901 Asphyxia: Secondary | ICD-10-CM | POA: Diagnosis not present

## 2016-03-13 DIAGNOSIS — R918 Other nonspecific abnormal finding of lung field: Secondary | ICD-10-CM | POA: Diagnosis not present

## 2016-03-13 DIAGNOSIS — I10 Essential (primary) hypertension: Secondary | ICD-10-CM | POA: Diagnosis not present

## 2016-03-19 ENCOUNTER — Encounter: Payer: Self-pay | Admitting: Cardiology

## 2016-03-22 DIAGNOSIS — Z7901 Long term (current) use of anticoagulants: Secondary | ICD-10-CM | POA: Diagnosis not present

## 2016-03-22 DIAGNOSIS — Z125 Encounter for screening for malignant neoplasm of prostate: Secondary | ICD-10-CM | POA: Diagnosis not present

## 2016-03-22 DIAGNOSIS — I10 Essential (primary) hypertension: Secondary | ICD-10-CM | POA: Diagnosis not present

## 2016-03-29 DIAGNOSIS — I4891 Unspecified atrial fibrillation: Secondary | ICD-10-CM | POA: Diagnosis not present

## 2016-03-29 DIAGNOSIS — Z86711 Personal history of pulmonary embolism: Secondary | ICD-10-CM | POA: Diagnosis not present

## 2016-03-29 DIAGNOSIS — R918 Other nonspecific abnormal finding of lung field: Secondary | ICD-10-CM | POA: Diagnosis not present

## 2016-03-29 DIAGNOSIS — D320 Benign neoplasm of cerebral meninges: Secondary | ICD-10-CM | POA: Diagnosis not present

## 2016-03-29 DIAGNOSIS — J849 Interstitial pulmonary disease, unspecified: Secondary | ICD-10-CM | POA: Diagnosis not present

## 2016-03-29 DIAGNOSIS — N183 Chronic kidney disease, stage 3 (moderate): Secondary | ICD-10-CM | POA: Diagnosis not present

## 2016-03-29 DIAGNOSIS — Z0000 Encounter for general adult medical examination without abnormal findings: Secondary | ICD-10-CM | POA: Diagnosis not present

## 2016-03-29 DIAGNOSIS — T1490 Injury, unspecified: Secondary | ICD-10-CM | POA: Diagnosis not present

## 2016-03-29 DIAGNOSIS — Z9861 Coronary angioplasty status: Secondary | ICD-10-CM | POA: Diagnosis not present

## 2016-03-31 ENCOUNTER — Encounter: Payer: Self-pay | Admitting: Internal Medicine

## 2016-03-31 ENCOUNTER — Telehealth: Payer: Self-pay | Admitting: Internal Medicine

## 2016-03-31 ENCOUNTER — Ambulatory Visit (INDEPENDENT_AMBULATORY_CARE_PROVIDER_SITE_OTHER): Payer: Commercial Managed Care - HMO | Admitting: Internal Medicine

## 2016-03-31 VITALS — BP 118/64 | HR 72 | Ht 67.0 in | Wt 164.0 lb

## 2016-03-31 DIAGNOSIS — R918 Other nonspecific abnormal finding of lung field: Secondary | ICD-10-CM | POA: Diagnosis not present

## 2016-03-31 NOTE — Patient Instructions (Signed)
We will call you in early August to arrange a follow up CT chest to see if there is any growth in the spot and decide then whether to continue to follow it or arrange for a biopsy  Call me in meantime for pain with breathing, coughing up any bloody or nasty mucus, or more trouble with breathing

## 2016-03-31 NOTE — Assessment & Plan Note (Signed)
PET 03/07/16 1. Hypermetabolic right upper lobe lung mass, positioned within an underlying bulla. Although this could represent a mycetoma,it is suspicious for a primary bronchogenic carcinoma. 2. Mediastinal and right hilar hypermetabolic nodes, suspicious for metastatic disease. 3. No extrathoracic soft tissue metastasis identified. 4. L1 hypermetabolism corresponding to a mild compression deformity. This could be due to osteopenia or an underlying osseous metastasis. More vague T10 and T11 hypermetabolism is indeterminate. If the patient is diagnosed with primary bronchogenic carcinoma, pre and postcontrast thoracolumbar spine MRI may be informative. 5. Incidental findings, including hiatal hernia, cholelithiasis, and advanced atherosclerosis.  Although the lesion is peripheral, it is assoc with large bulla and high risk PTX from bx in a pt who does not appear to be a surgical candidate and may well have stage IV dz though asymptomatic x for wt loss.  Discussed in detail all the  indications, usual  risks and alternatives  relative to the benefits with patient and family who wish to proceed with conservative f/u with repeat study in 3 months and reconsider options at that point  Total time devoted to counseling  = 35/49mreview case with pt/wife/son discussion of options/alternatives/ personally creating written instructions  in presence of pt  then going over those specific  Instructions directly with the pt including how to use all of the meds but in particular covering each new medication in detail and the difference between the maintenance/automatic meds and the prns using an action plan format for the latter.

## 2016-03-31 NOTE — Progress Notes (Signed)
Subjective:     Patient ID: Darrell Mendoza, male   DOB: [birth year]  MRN: [id number]  HPI  70 yowm quit smoking 1969 with no resp problems fell 02/11/16 >  eval included cxr with R lung mass > referred to pulmonary clinic 03/31/2016 by Dr Osborne Casco   03/31/2016 1st [facility] office visit/ Shenika Quint   Chief Complaint  Patient presents with  . Pulmonary Consult    Referred by Dr. Osborne Casco for eval of lung mass. Pt states he has been losing weight and has had poor appetite for the past month or so.     No obvious day to day or daytime variability or assoc excess/ purulent sputum or mucus plugs or hemoptysis or cp or chest tightness, subjective wheeze or overt sinus or hb symptoms. No unusual exp hx or h/o childhood pna/ asthma or knowledge of premature birth.  Sleeping ok without nocturnal  or early am exacerbation  of respiratory  c/o's or need for noct saba. Also denies any obvious fluctuation of symptoms with weather or environmental changes or other aggravating or alleviating factors except as outlined above   Current Medications, Allergies, Complete Past Medical History, Past Surgical History, Family History, and Social History were reviewed in [employer] record.  ROS  The following are not active complaints unless bolded sore throat, dysphagia, dental problems, itching, sneezing,  nasal congestion or excess/ purulent secretions, ear ache,   fever, chills, sweats, unintended wt loss, classically pleuritic or exertional cp,  orthopnea pnd or leg swelling, presyncope, palpitations, abdominal pain, anorexia, nausea, vomiting, diarrhea  or change in bowel or bladder habits, change in stools or urine, dysuria,hematuria,  rash, arthralgias, visual complaints, headache, numbness, weakness or ataxia or problems with walking or coordination,  change in mood/affect or memory.        Review of Systems     Objective:   Physical Exam    pleasant but frail elderly wm   Wt  Readings from Last 3 Encounters:  03/31/16 164 lb (74.39 kg)  03/11/16 164 lb 4 oz (74.503 kg)  02/12/16 172 lb 2.9 oz (78.1 kg)    Vital signs reviewed   HEENT: nl dentition, turbinates, and oropharynx. Nl external ear canals without cough reflex   NECK :  without JVD/Nodes/TM/ nl carotid upstrokes bilaterally   LUNGS: no acc muscle use,  Nl contour chest which is clear to A and P bilaterally without cough on insp or exp maneuvers   CV:  RRR  no s3 or murmur or increase in P2, no edema   ABD:  soft and nontender with nl inspiratory excursion in the supine position. No bruits or organomegaly, bowel sounds nl  MS:  Nl gait/ ext warm without deformities, calf tenderness, cyanosis or clubbing No obvious joint restrictions   SKIN: warm and dry without lesions    NEURO:  alert, approp, nl sensorium with  no motor deficits     I personally reviewed images and agree with radiology impression as follows:   PET 03/02/16 1. Hypermetabolic right upper lobe lung mass, positioned within an underlying bulla. Although this could represent a mycetoma,it is suspicious for a primary bronchogenic carcinoma. 2. Mediastinal and right hilar hypermetabolic nodes, suspicious for metastatic disease. 3. No extrathoracic soft tissue metastasis identified. 4. L1 hypermetabolism corresponding to a mild compression deformity. This could be due to osteopenia or an underlying osseous metastasis. More vague T10 and T11 hypermetabolism is indeterminate. If the patient is diagnosed with primary bronchogenic carcinoma, pre  and postcontrast thoracolumbar spine MRI may be informative. 5. Incidental findings, including hiatal hernia, cholelithiasis, and advanced atherosclerosis.    Assessment:

## 2016-04-02 ENCOUNTER — Institutional Professional Consult (permissible substitution): Payer: Commercial Managed Care - HMO | Admitting: Internal Medicine

## 2016-04-19 ENCOUNTER — Ambulatory Visit: Payer: Commercial Managed Care - HMO | Admitting: Cardiology

## 2016-04-20 ENCOUNTER — Encounter: Payer: Self-pay | Admitting: Podiatry

## 2016-04-20 ENCOUNTER — Ambulatory Visit (INDEPENDENT_AMBULATORY_CARE_PROVIDER_SITE_OTHER): Payer: Commercial Managed Care - HMO | Admitting: Podiatry

## 2016-04-20 DIAGNOSIS — B351 Tinea unguium: Secondary | ICD-10-CM

## 2016-04-20 DIAGNOSIS — M79676 Pain in unspecified toe(s): Secondary | ICD-10-CM

## 2016-04-20 NOTE — Progress Notes (Signed)
Patient ID: Darrell Mendoza, male   DOB: November 09, 1924, 82 y.o.   MRN: [id number] Complaint:  Visit Type: Patient returns to my office for continued preventative foot care services. Complaint: Patient states" my nails have grown long and thick and become painful to walk and wear shoes". The patient presents for preventative foot care services. No changes to ROS  Podiatric Exam: Vascular: dorsalis pedis and posterior tibial pulses are palpable bilateral. Capillary return is immediate. Temperature gradient is WNL. Skin turgor WNL  Sensorium: Normal Semmes Weinstein monofilament test. Normal tactile sensation bilaterally. Nail Exam: Pt has thick disfigured discolored nails with subungual debris noted bilateral entire nail hallux through fifth toenails Ulcer Exam: There is no evidence of ulcer or pre-ulcerative changes or infection. Orthopedic Exam: Muscle tone and strength are WNL. No limitations in general ROM. No crepitus or effusions noted. Foot type and digits show no abnormalities. Bony prominences are unremarkable. Skin: No Porokeratosis. No infection or ulcers  Diagnosis:  Onychomycosis, , Pain in right toe, pain in left toes  Treatment & Plan Procedures and Treatment: Consent by patient was obtained for treatment procedures. The patient understood the discussion of treatment and procedures well. All questions were answered thoroughly reviewed. Debridement of mycotic and hypertrophic toenails, 1 through 5 bilateral and clearing of subungual debris. No ulceration, no infection noted.  Return Visit-Office Procedure: Patient instructed to return to the office for a follow up visit 3 months for continued evaluation and treatment.    Gardiner Barefoot DPM

## 2016-04-23 ENCOUNTER — Ambulatory Visit: Payer: Commercial Managed Care - HMO | Admitting: Cardiology

## 2016-04-29 DIAGNOSIS — I4891 Unspecified atrial fibrillation: Secondary | ICD-10-CM | POA: Diagnosis not present

## 2016-04-29 DIAGNOSIS — Z7901 Long term (current) use of anticoagulants: Secondary | ICD-10-CM | POA: Diagnosis not present

## 2016-05-07 ENCOUNTER — Other Ambulatory Visit: Payer: Self-pay | Admitting: Internal Medicine

## 2016-05-07 DIAGNOSIS — R918 Other nonspecific abnormal finding of lung field: Secondary | ICD-10-CM

## 2016-05-27 DIAGNOSIS — R634 Abnormal weight loss: Secondary | ICD-10-CM | POA: Diagnosis not present

## 2016-05-27 DIAGNOSIS — Z23 Encounter for immunization: Secondary | ICD-10-CM | POA: Diagnosis not present

## 2016-05-27 DIAGNOSIS — Z7901 Long term (current) use of anticoagulants: Secondary | ICD-10-CM | POA: Diagnosis not present

## 2016-05-27 DIAGNOSIS — I4891 Unspecified atrial fibrillation: Secondary | ICD-10-CM | POA: Diagnosis not present

## 2016-06-02 ENCOUNTER — Ambulatory Visit (INDEPENDENT_AMBULATORY_CARE_PROVIDER_SITE_OTHER)
Admission: RE | Admit: 2016-06-02 | Discharge: 2016-06-02 | Disposition: A | Discharge status: Home / Self Care | Payer: Commercial Managed Care - HMO | Source: Ambulatory Visit | Attending: Internal Medicine | Admitting: Internal Medicine

## 2016-06-02 DIAGNOSIS — R918 Other nonspecific abnormal finding of lung field: Secondary | ICD-10-CM

## 2016-06-03 NOTE — Progress Notes (Signed)
Spoke with pt and notified of results per Dr. Wert. Pt verbalized understanding and denied any questions. 

## 2016-06-04 ENCOUNTER — Ambulatory Visit: Payer: Commercial Managed Care - HMO | Admitting: Internal Medicine

## 2016-06-07 ENCOUNTER — Encounter: Payer: Self-pay | Admitting: Internal Medicine

## 2016-06-07 ENCOUNTER — Ambulatory Visit (INDEPENDENT_AMBULATORY_CARE_PROVIDER_SITE_OTHER): Payer: Commercial Managed Care - HMO | Admitting: Internal Medicine

## 2016-06-07 VITALS — BP 122/70 | HR 83 | Ht 67.0 in | Wt 158.0 lb

## 2016-06-07 DIAGNOSIS — R918 Other nonspecific abnormal finding of lung field: Secondary | ICD-10-CM | POA: Diagnosis not present

## 2016-06-07 NOTE — Progress Notes (Signed)
Subjective:     Patient ID: Darrell Mendoza, male   DOB: February 07, 1925  MRN: [id number]    Brief patient profile:  83 yowm quit smoking 1969 with no resp problems fell 02/11/16 >  eval included cxr with R lung mass > referred to pulmonary clinic 03/31/2016 by Dr Osborne Casco     History of Present Illness  03/31/2016 1st [facility] office visit/ [name]   Chief Complaint  Patient presents with  . Pulmonary Consult    Referred by Dr. Osborne Casco for eval of lung mass. Pt states he has been losing weight and has had poor appetite for the past month or so.   We will call you in early August to arrange a follow up CT chest to see if there is any growth in the spot and decide then whether to continue to follow it or arrange for a biopsy Call me in meantime for pain with breathing, coughing up any bloody or nasty mucus, or more trouble with breathing    06/07/2016  f/u ov/[name] re: RUL mass with R hilar and med nodes Pos on PEt as is T10-11  Chief Complaint  Patient presents with  . Follow-up    Pt here to discuss ct chest. He c/o loss of appetite and wt loss for the past 3-4 wks.   cough is white / thick / occ chokes him  More in evening  Some memory issues per fm/ does not seem to bother him and he's had no cp or hemoptysis. Denies back pain correlating with T10/11 findings.  No obvious day to day or daytime variability or assoc excess/ purulent sputum or mucus plugs or hemoptysis or cp or chest tightness, subjective wheeze or overt sinus or hb symptoms. No unusual exp hx or h/o childhood pna/ asthma or knowledge of premature birth.  Sleeping ok without nocturnal  or early am exacerbation  of respiratory  c/o's or need for noct saba. Also denies any obvious fluctuation of symptoms with weather or environmental changes or other aggravating or alleviating factors except as outlined above   Current Medications, Allergies, Complete Past Medical History, Past Surgical History, Family History, and Social  History were reviewed in [employer] record.  ROS  The following are not active complaints unless bolded sore throat, dysphagia, dental problems, itching, sneezing,  nasal congestion or excess/ purulent secretions, ear ache,   fever, chills, sweats, unintended wt loss, classically pleuritic or exertional cp,  orthopnea pnd or leg swelling, presyncope, palpitations, abdominal pain, anorexia, nausea, vomiting, diarrhea  or change in bowel or bladder habits, change in stools or urine, dysuria,hematuria,  rash, arthralgias, visual complaints, headache, numbness, weakness or ataxia or problems with walking or coordination,  change in mood/affect or memory.             Objective:   Physical Exam    pleasant but frail elderly wm w/c bound    06/07/2016       158   03/31/16 164 lb (74.39 kg)  03/11/16 164 lb 4 oz (74.503 kg)  02/12/16 172 lb 2.9 oz (78.1 kg)    Vital signs reviewed   HEENT: nl dentition, turbinates, and oropharynx. Nl external ear canals without cough reflex   NECK :  without JVD/Nodes/TM/ nl carotid upstrokes bilaterally   LUNGS: no acc muscle use,  Nl contour chest which is clear to A and P bilaterally without cough on insp or exp maneuvers   CV:  RRR  no s3 or murmur or  increase in P2, no edema   ABD:  soft and nontender with nl inspiratory excursion in the supine position. No bruits or organomegaly, bowel sounds nl  MS:  Nl gait/ ext warm without deformities, calf tenderness, cyanosis or clubbing No obvious joint restrictions   SKIN: warm and dry without lesions    NEURO:  alert, approp, nl sensorium with  no motor deficits     I personally reviewed images and agree with radiology impression as follows:   PET 03/02/16 1. Hypermetabolic right upper lobe lung mass, positioned within an underlying bulla. Although this could represent a mycetoma,it is suspicious for a primary bronchogenic carcinoma. 2. Mediastinal and right hilar  hypermetabolic nodes, suspicious for metastatic disease. 3. No extrathoracic soft tissue metastasis identified. 4. L1 hypermetabolism corresponding to a mild compression deformity. This could be due to osteopenia or an underlying osseous metastasis. More vague T10 and T11 hypermetabolism is indeterminate. If the patient is diagnosed with primary bronchogenic carcinoma, pre and postcontrast thoracolumbar spine MRI may be informative. 5. Incidental findings, including hiatal hernia, cholelithiasis, and advanced atherosclerosis.    Assessment:

## 2016-06-07 NOTE — Patient Instructions (Signed)
See Dr Osborne Casco to review your weight loss and memory issues  If he feels biopsy of the lung is needed I would recommend Dr Lake Bells for this purpose and be happy to arrange an office evaluation first   Pulmonary follow up can be as needed

## 2016-06-10 ENCOUNTER — Telehealth: Payer: Self-pay | Admitting: Internal Medicine

## 2016-06-10 NOTE — Telephone Encounter (Signed)
Spoke with Otila Kluver. Advised her that we do not have her listed on pt's DPR. Also advised her that I would need to speak to the pt about this matter and she stated that this was not necessary. She states that she understood and will have the pt update this the next time they are in the office. Nothing further was needed at this time.

## 2016-06-12 NOTE — Assessment & Plan Note (Signed)
PET 03/07/16 1. Hypermetabolic right upper lobe lung mass, positioned within an underlying bulla. Although this could represent a mycetoma,it is suspicious for a primary bronchogenic carcinoma. 2. Mediastinal and right hilar hypermetabolic nodes, suspicious for metastatic disease. 3. No extrathoracic soft tissue metastasis identified. 4. L1 hypermetabolism corresponding to a mild compression deformity. This could be due to osteopenia or an underlying osseous metastasis. More vague T10 and T11 hypermetabolism is indeterminate. If the patient is diagnosed with primary bronchogenic carcinoma, pre and postcontrast thoracolumbar spine MRI may be informative. 5. Incidental findings, including hiatal hernia, cholelithiasis, and advanced atherosclerosis. - CT chest 06/02/16 > 1. Interval growth of peripheral right upper lobe lung mass, now 4.8 cm, most consistent with primary bronchogenic carcinoma. 2. Interval growth of right hilar lymphadenopathy, suspicious for nodal metastasis. Mild right paratracheal and subcarinal mediastinal lymphadenopathy appears stable, possibly representing nodal metastases. 3. Spectrum of finding suggestive of basilar predominant fibrotic interstitial lung disease, with possible interval worsening, worrisome for usual interstitial pneumonia (UIP).  Discussed in detail all the  indications, usual  risks and alternatives  relative to the benefits with patient and fm who are very reluctant to consider any form of therapy unless directed at the underlying problem = wt loss, which is clearly a sign of metastatic dz as suggested by CT and not anything that would get better with some form of limited rx eg RT.  He has no back pain though I suspect he may have involvement of the Thoracic spine and if if this develops low threshold to have RT do CT directed bx there for tissue dx and to offer palliative RT  If they change their minds, a relatively low risk procedure would be  Endobronchial [place] bx but this does require general anesthesia and referral to Dr Lake Bells for this but I will defer this call to Dr Al Corpus /fm and see him back here prn  .Total time devoted to counseling  = 315/54mreview case with pt/fm discussion of options/alternatives/ personally creating written instructions  in presence of pt  then going over those specific  Instructions directly with the pt including how to use all of the meds but in particular covering each new medication in detail and the difference between the maintenance/automatic meds and the prns using an action plan format for the latter.

## 2016-06-16 ENCOUNTER — Telehealth: Payer: Self-pay | Admitting: Internal Medicine

## 2016-06-16 ENCOUNTER — Telehealth: Payer: Self-pay | Admitting: *Deleted

## 2016-06-16 NOTE — Telephone Encounter (Signed)
LM x 1 

## 2016-06-16 NOTE — Telephone Encounter (Signed)
"  My father is not a patient there yet.  He was diagnosed with lung cancer by Dr. Lenard Forth and given four options.  Do nothing, watch, monitor and biopsy, chemotherapy or radiation.  He's decided to do radiation."  Advised to notify Dr. Melvyn Novas of these options for a referral to the radiation providers.

## 2016-06-17 NOTE — Telephone Encounter (Signed)
Plan was to let Dr Osborne Casco make the call > would need a bx before seeing oncology as they focus on the treatment not the diagnosis > if dx is what they want I outlined the steps needed at the last ov but send this along to Dr Osborne Casco so he knows I'm deferring this decision to his judgement as the pt is not an operative candidate and was not interested in chemo or RT at the last ov and had general geriactric decline issues > pulmonary problems

## 2016-06-17 NOTE — Telephone Encounter (Signed)
Spoke with pt's daughter in [employer], Otila Kluver. Advised her that we would have to get this message to Dr. Melvyn Novas to address.  MW - please advise if we can do a referral to oncology. Thanks.

## 2016-06-17 NOTE — Telephone Encounter (Signed)
Spoke with Otila Kluver - aware that we are deferring this decision to Dr Osborne Casco.  Will send this message to Dr Osborne Casco to make him aware per Dr Melvyn Novas.

## 2016-06-17 NOTE — Telephone Encounter (Signed)
Darrell Mendoza waiting in the lobby

## 2016-06-24 DIAGNOSIS — R634 Abnormal weight loss: Secondary | ICD-10-CM | POA: Diagnosis not present

## 2016-06-24 DIAGNOSIS — Z86711 Personal history of pulmonary embolism: Secondary | ICD-10-CM | POA: Diagnosis not present

## 2016-06-24 DIAGNOSIS — Z7901 Long term (current) use of anticoagulants: Secondary | ICD-10-CM | POA: Diagnosis not present

## 2016-06-24 DIAGNOSIS — I4891 Unspecified atrial fibrillation: Secondary | ICD-10-CM | POA: Diagnosis not present

## 2016-07-05 ENCOUNTER — Ambulatory Visit: Payer: Commercial Managed Care - HMO | Admitting: Radiation Oncology

## 2016-07-05 ENCOUNTER — Ambulatory Visit: Payer: Commercial Managed Care - HMO

## 2016-07-07 ENCOUNTER — Encounter: Payer: Self-pay | Admitting: Radiation Oncology

## 2016-07-07 NOTE — Progress Notes (Signed)
Thoracic Location of Tumor / Histology: Right Upper Lung mass and Right hilar and med nodes positive  Patient presented  months ago with symptoms of: loss appetite ,weight loss  Past 3-4 weeks , productive  Cough with white thick phelgm  Biopsies of  (if applicable) revealed: none , Dr. Melvyn Novas referred to Dr. Osborne Casco 06/24/16  Tobacco/Marijuana/Snuff/ETOH use: smoked cigarettes 42 years quit 01/01/84 no smokeless tobacco, no alcohol or drug use  Past/Anticipated interventions by cardiothoracic surgery, if any: No  Past/Anticipated interventions by medical oncology, if any: NO  Signs/Symptoms  Weight changes, if any: 19 lb loss in 1 month, gained  3 lb back  Respiratory complaints, if any:   Hemoptysis, if any: NO  Pain issues, if any: chronic back pain 3/10 scale now   SAFETY ISSUES: Yes, multiple falls   Prior radiation? NO  Pacemaker/ICD? No  Is the patient on methotrexate? 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

## 2016-07-12 ENCOUNTER — Encounter: Payer: Self-pay | Admitting: Radiation Oncology

## 2016-07-12 ENCOUNTER — Ambulatory Visit
Admission: RE | Admit: 2016-07-12 | Discharge: 2016-07-12 | Disposition: A | Discharge status: Home / Self Care | Payer: Commercial Managed Care - HMO | Source: Ambulatory Visit | Attending: Radiation Oncology | Admitting: Radiation Oncology

## 2016-07-12 VITALS — BP 101/56 | HR 71 | Temp 98.6°F | Resp 20 | Ht 67.0 in | Wt 157.0 lb

## 2016-07-12 DIAGNOSIS — Z86718 Personal history of other venous thrombosis and embolism: Secondary | ICD-10-CM | POA: Insufficient documentation

## 2016-07-12 DIAGNOSIS — I4891 Unspecified atrial fibrillation: Secondary | ICD-10-CM | POA: Insufficient documentation

## 2016-07-12 DIAGNOSIS — Z85830 Personal history of malignant neoplasm of bone: Secondary | ICD-10-CM | POA: Diagnosis not present

## 2016-07-12 DIAGNOSIS — Z87891 Personal history of nicotine dependence: Secondary | ICD-10-CM | POA: Diagnosis not present

## 2016-07-12 DIAGNOSIS — N183 Chronic kidney disease, stage 3 (moderate): Secondary | ICD-10-CM | POA: Diagnosis not present

## 2016-07-12 DIAGNOSIS — N400 Enlarged prostate without lower urinary tract symptoms: Secondary | ICD-10-CM | POA: Insufficient documentation

## 2016-07-12 DIAGNOSIS — Z7901 Long term (current) use of anticoagulants: Secondary | ICD-10-CM | POA: Diagnosis not present

## 2016-07-12 DIAGNOSIS — E785 Hyperlipidemia, unspecified: Secondary | ICD-10-CM | POA: Insufficient documentation

## 2016-07-12 DIAGNOSIS — Z86711 Personal history of pulmonary embolism: Secondary | ICD-10-CM | POA: Diagnosis not present

## 2016-07-12 DIAGNOSIS — Z510 Encounter for antineoplastic radiation therapy: Secondary | ICD-10-CM | POA: Insufficient documentation

## 2016-07-12 DIAGNOSIS — Z831 Family history of other infectious and parasitic diseases: Secondary | ICD-10-CM | POA: Insufficient documentation

## 2016-07-12 DIAGNOSIS — Z888 Allergy status to other drugs, medicaments and biological substances status: Secondary | ICD-10-CM | POA: Diagnosis not present

## 2016-07-12 DIAGNOSIS — R918 Other nonspecific abnormal finding of lung field: Secondary | ICD-10-CM

## 2016-07-12 DIAGNOSIS — I129 Hypertensive chronic kidney disease with stage 1 through stage 4 chronic kidney disease, or unspecified chronic kidney disease: Secondary | ICD-10-CM | POA: Diagnosis not present

## 2016-07-12 DIAGNOSIS — I2510 Atherosclerotic heart disease of native coronary artery without angina pectoris: Secondary | ICD-10-CM | POA: Insufficient documentation

## 2016-07-12 DIAGNOSIS — C3411 Malignant neoplasm of upper lobe, right bronchus or lung: Secondary | ICD-10-CM | POA: Diagnosis not present

## 2016-07-12 DIAGNOSIS — Z923 Personal history of irradiation: Secondary | ICD-10-CM | POA: Diagnosis not present

## 2016-07-12 NOTE — Progress Notes (Signed)
Radiation Oncology         [phone] ________________________________  Name: Darrell Mendoza MRN: [id number]  Date: 07/12/2016  DOB: 1925-09-13  FG:[id number] Sandrea Hughs, MD  Tisovec, Fransico Him, MD     REFERRING PHYSICIAN: Tisovec, Fransico Him, MD   DIAGNOSIS: The primary encounter diagnosis was Lung mass. A diagnosis of Malignant neoplasm of right upper lobe of lung (HCC) was also pertinent to this visit.   HISTORY OF PRESENT ILLNESS::Darrell Mendoza is a 82 y.o. male who is seen for an initial consultation visit.  The patient presented to the ED on 02/11/16 for a fall. CT of the head showed a partially calcified extra-axial mass (possible meningioma) arising from the right temporal bone measuring 1.5 x 4.0 cm with mass effect on the right temporal lobe, edema in the white matter of the right temporal lobe, and likely entrapment of the temporal horn of the right lateral ventricle. A chest X-ray at that time revealed advanced emphysematous change without definitive superimposed acute cardiopulmonary disease, a 3.2 x 2.5 cm nodular opacity/mass within the peripheral aspect of the RUL, and slight worsening of left basilar/retrocardia opacities (leikely atelectasis). Further CT imaging of the chest showed a 2.8 x 3.0 macro lobular mass in the RUL, severe paraseptal emphysematous change, extensive reticular opacities in the bilateral lung bases and costophrenic angle without evidence of honeycombing, right hilar adenopathy, scattered and numerous mediastinal lymph nodes not enlarged by size criteria, no axillary lymphadenopathy, and no pleural effusion or pneumothorax.  MRI of the brain on 03/02/16 showed a 5.8 x 2.3 x 4.1 cm mass spanning from the anterior lateral right middle cranial fossa superiorly along the right pterion abutting the right middle cerebral artery, compression of the adjacent right frontal and temporal lobe, and within the anterior compressed right temporal lobe there is a 2 x 2 x 2.1 cm cyst  and vasogenic edema.  PET scan on 03/02/16 showed a 3.3 x 3.4 cm RUL mass with SUV max 64.3, a precarinal node measures 1.3 cm and a S.U.V. max of 6.6, right hilar hypermetabolism corresponding to adenopathy measuring 1.4 cm and a S.U.V. max of 7.6, hypermetabolism corresponding to a L1 mild compression deformity with SUV max 9.2, and areas of vague hypermetabolism involving the T10 and T11 vertebral bodies.  The patient saw Dr. Melvyn Novas on 03/31/16 who did not believe the patient was a surgical candidate. The patient and family decided to proceed with a conservative follow up with a repeat scan 3 months afterwards.  CT of the chest on 06/02/16 showed interval growth of the peripheral RUL mass, interval growth of right hilar lymphadenopathy, and possible interval worsening of basilar predominant fibrotic interstitial lung disease.  The patient returned to Dr. Melvyn Novas on 06/07/16 who decided to refer the patient to Dr. Osborne Casco on 06/24/16 to discusd a possible biopsy of the RUL mass. It was decided the patient was not a candidate for biopsy.  The patient, wife, son, and daughter-in-law present today to discuss the role of radiation for the management of his disease.  PREVIOUS RADIATION THERAPY: Yes. 2001: Right hip osteosarcoma s/p chemoradiation, resection, and replacement in [place].  PAST MEDICAL HISTORY:  has a past medical history of Atrial fibrillation ([place]) (12/11/2015); Benign prostatic hypertrophy; Bilateral cataracts; CKD (chronic kidney disease) stage 3, GFR 30-59 ml/min; Coronary artery disease; Deep vein thrombosis ([place]); Greenfield filter in place; Hand arthritis; Hyperlipidemia; Hypertension ( ); Hypotension; Lightheadedness; Osteosarcoma ([place]) (2001); Premature ventricular contractions; and Pulmonary embolism ([place]).     PAST  SURGICAL HISTORY: Past Surgical History:  Procedure Laterality Date  . CARDIAC CATHETERIZATION  10/22/2002   left heart cath  . EXCISON TOENAIL Right    HALLUX  . HIP  SURGERY  2001-2001   right hip surgery  . ROTATOR CUFF REPAIR  1985   right rotator cuff surgery  . STOMACH SURGERY  1972   for a hernia     FAMILY HISTORY: family history includes Tuberculosis in his mother.   SOCIAL HISTORY:  reports that he quit smoking about 48 years ago. His smoking use included Cigarettes. He has a 30.00 pack-year smoking history. He quit smokeless tobacco use about 32 years ago. He reports that he drinks alcohol. He reports that he does not use drugs.   ALLERGIES: Review of patient's allergies indicates no known allergies.   MEDICATIONS:  Current Outpatient Prescriptions  Medication Sig Dispense Refill  . calcium-vitamin D (OSCAL WITH D) 250-125 MG-UNIT per tablet Take 1 tablet by mouth daily.    . carvedilol (COREG) 3.125 MG tablet Take 1 tablet (3.125 mg total) by mouth 2 (two) times daily. 180 tablet 3  . finasteride (PROSCAR) 5 MG tablet Take 5 mg by mouth daily.    . fish oil-omega-3 fatty acids 1000 MG capsule Take 2 g by mouth daily.    . Multiple Vitamin (MULTIVITAMIN) tablet Take 1 tablet by mouth daily.    . pravastatin (PRAVACHOL) 40 MG tablet Take 40 mg by mouth daily.    . Tamsulosin HCl (FLOMAX) 0.4 MG CAPS Take 0.4 mg by mouth daily.    . vitamin D, CHOLECALCIFEROL, 400 UNITS tablet Take 400 Units by mouth daily.    Marland Kitchen warfarin (COUMADIN) 5 MG tablet Take 0.5-1 tablets (2.5-5 mg total) by mouth daily. Take 2.5 mg Sun / Tues / Wed / Thurs / Sat Take 5 mg on Mon / Fri 30 tablet 0  . HYDROcodone-acetaminophen (NORCO/VICODIN) 5-325 MG tablet Take 1 tablet by mouth every 6 (six) hours as needed.     No current facility-administered medications for this encounter.      REVIEW OF SYSTEMS:  A 15 point review of systems is documented in the electronic medical record. This was obtained by the nursing staff. However, I reviewed this with the patient to discuss relevant findings and make appropriate changes.  Pertinent items are noted in HPI.   The  patient reports a cough with thick white phlegm, chronic back pain as a 3/10, a loss of appetite, and a weight loss of 19 lbs in the past month.    PHYSICAL EXAM:  height is '5\' 7"'$  (1.702 m) and weight is 157 lb (71.2 kg). His oral temperature is 98.6 F (37 C). His blood pressure is 101/56 (abnormal) and his pulse is 71. His respiration is 20 and oxygen saturation is 95%.   General: Well-developed, in no acute distress HEENT: Normocephalic, atraumatic; oral cavity clear Neck: Supple without any lymphadenopathy Cardiovascular: Regular rate and rhythm Respiratory: Decreased breath sounds in the right upper lung, otherwise clear to auscultation bilaterally. GI: Soft, nontender, normal bowel sounds Extremities: No edema present. The patient presents to the clinic in a wheelchair. Neuro: No focal deficits  ECOG = 2  0 - Asymptomatic (Fully active, able to carry on all predisease activities without restriction)  1 - Symptomatic but completely ambulatory (Restricted in physically strenuous activity but ambulatory and able to carry out work of a light or sedentary nature. For example, light housework, office work)  2 - Symptomatic, <50% in  bed during the day (Ambulatory and capable of all self care but unable to carry out any work activities. Up and about more than 50% of waking hours)  3 - Symptomatic, >50% in bed, but not bedbound (Capable of only limited self-care, confined to bed or chair 50% or more of waking hours)  4 - Bedbound (Completely disabled. Cannot carry on any self-care. Totally confined to bed or chair)  5 - Death   Eustace Pen MM, Creech RH, Tormey DC, et al. [phone]). "Toxicity and response criteria of the [facility] Group". [place] Oncol. 5 (6): 649-55   LABORATORY DATA:  Lab Results  Component Value Date   WBC 3.1 (L) 02/13/2016   HGB 12.9 (L) 02/13/2016   HCT 39.6 02/13/2016   MCV 94.5 02/13/2016   PLT 91 (L) 02/13/2016   Lab Results  Component  Value Date   NA 139 03/09/2016   K 5.5 (H) 03/09/2016   CL 105 03/09/2016   CO2 25 03/09/2016   Lab Results  Component Value Date   ALT 18 02/13/2016   AST 24 02/13/2016   ALKPHOS 53 02/13/2016   BILITOT 0.7 02/13/2016      RADIOGRAPHY: No results found.     IMPRESSION: Patient has apparent stage III right upper lung cancer without pathologic confirmation. The patient has been told he is not a good candidate for biopsy due to the patient's age and comorbidities. The patient and his family are looking for possible options in terms of treatment/management. The patient was diagnosed back in May and he has been managed conservatively thus far.  PLAN: We discussed observation for now, chemotherapy, or radiation. He is not interested in chemotherapy. The patient also was not interested in a biopsy at this time. He and his family feel that this is too risky based on the information they have been given. We discussed the possible benefits and side effects of radiation. The patient will discuss with his family concerning observation vs radiation treatment. We discussed a possible 2 week course of radiation which could be lengthened if he tolerates it well given if he decides to proceed with radiation. The patient will discuss this with his family and they will let us know in the near future whether they wish to proceed with radiation treatment at this time. If not, then I would be happy to see the patient in several months to see how he is doing.      ________________________________   Jodelle Gross, MD, PhD  This document serves as a record of services personally performed by Kyung Rudd, MD. It was created on his behalf by Darcus Austin, a trained medical scribe. The creation of this record is based on the scribe's personal observations and the provider's statements to them. This document has been checked and approved by the attending provider.

## 2016-07-12 NOTE — Progress Notes (Signed)
Please see the Nurse Progress Note in the MD Initial Consult Encounter for this patient. 

## 2016-07-16 DIAGNOSIS — I4891 Unspecified atrial fibrillation: Secondary | ICD-10-CM | POA: Diagnosis not present

## 2016-07-16 DIAGNOSIS — R5381 Other malaise: Secondary | ICD-10-CM | POA: Diagnosis not present

## 2016-07-16 DIAGNOSIS — D72819 Decreased white blood cell count, unspecified: Secondary | ICD-10-CM | POA: Diagnosis not present

## 2016-07-16 DIAGNOSIS — I509 Heart failure, unspecified: Secondary | ICD-10-CM | POA: Diagnosis not present

## 2016-07-16 DIAGNOSIS — Z6824 Body mass index (BMI) 24.0-24.9, adult: Secondary | ICD-10-CM | POA: Diagnosis not present

## 2016-07-16 DIAGNOSIS — Z7901 Long term (current) use of anticoagulants: Secondary | ICD-10-CM | POA: Diagnosis not present

## 2016-07-20 ENCOUNTER — Ambulatory Visit: Payer: Commercial Managed Care - HMO | Admitting: Podiatry

## 2016-07-21 DIAGNOSIS — R5381 Other malaise: Secondary | ICD-10-CM | POA: Diagnosis not present

## 2016-07-21 DIAGNOSIS — I4891 Unspecified atrial fibrillation: Secondary | ICD-10-CM | POA: Diagnosis not present

## 2016-07-21 DIAGNOSIS — I509 Heart failure, unspecified: Secondary | ICD-10-CM | POA: Diagnosis not present

## 2016-07-21 DIAGNOSIS — Z7901 Long term (current) use of anticoagulants: Secondary | ICD-10-CM | POA: Diagnosis not present

## 2016-07-28 ENCOUNTER — Telehealth: Payer: Self-pay | Admitting: Physician Assistant

## 2016-07-28 NOTE — Telephone Encounter (Signed)
Received records from [facility] for appointment on 08/04/16 with Janan Ridge, PA.  Records given to [employer] (medical records) for Isaac Laud 's schedule on 08/04/16. lp

## 2016-07-30 ENCOUNTER — Ambulatory Visit: Payer: Commercial Managed Care - HMO | Admitting: Radiation Oncology

## 2016-08-04 ENCOUNTER — Ambulatory Visit (INDEPENDENT_AMBULATORY_CARE_PROVIDER_SITE_OTHER): Payer: Commercial Managed Care - HMO | Admitting: Physician Assistant

## 2016-08-04 ENCOUNTER — Encounter: Payer: Self-pay | Admitting: Physician Assistant

## 2016-08-04 VITALS — BP 136/72 | HR 89 | Ht 67.0 in | Wt 158.0 lb

## 2016-08-04 DIAGNOSIS — I10 Essential (primary) hypertension: Secondary | ICD-10-CM | POA: Diagnosis not present

## 2016-08-04 DIAGNOSIS — I482 Chronic atrial fibrillation, unspecified: Secondary | ICD-10-CM

## 2016-08-04 DIAGNOSIS — I5022 Chronic systolic (congestive) heart failure: Secondary | ICD-10-CM | POA: Diagnosis not present

## 2016-08-04 DIAGNOSIS — Z79899 Other long term (current) drug therapy: Secondary | ICD-10-CM

## 2016-08-04 DIAGNOSIS — C3411 Malignant neoplasm of upper lobe, right bronchus or lung: Secondary | ICD-10-CM

## 2016-08-04 NOTE — Progress Notes (Signed)
Cardiology Office Note    Date:  08/04/2016   ID:  Darrell Mendoza, DOB 23-Mar-1925, MRN [id number]  PCP:  Haywood Pao, MD  Cardiologist:  Dr. Martinique   Chief Complaint  Patient presents with  . Follow-up    seen for Dr. Martinique, fluid accumulation    History of Present Illness:  Darrell Mendoza is a 82 y.o. male with PMH of CAD, chronic afib, LV dysfunction/ICM with baseline EF 30-35%. He has a history of coronary artery disease and underwent stenting in mid RCA in 2004 with 3.0 x 23 mm Cypher stent. Prior stress test in December 2010 showed basal to mid inferior wall scar without ischemia and ejection fraction of 61%. He also had a history of DVT with PE on chronic Coumadin therapy. He was found to be in new atrial fibrillation in early 2017 although he was asymptomatic. Echo obtained in March 2017 however showed ejection fraction is down to 30-35%, he subsequently underwent Myoview study that showed inferior scar without ischemia. He was started on low-dose lisinopril 5 mg daily point however did not tolerate this due to hyperkalemia and renal insufficiency.  He was admitted in May 2017 with acute respiratory failure and hypoxia. He also had a fall with vertebral fracture. CT of the head showed finding of a mass consistent with large meningioma. He also had abdominal x-ray and his CT showed a 3.5 cm mass in the right upper lobe. He has since had a cranial MRI and PET scan. MRI of the brain obtained on 7/23 showed heterogeneous 5.8 x 2.3 x 4.1 cm mass spans from the anterior lateral right mid cranial foci superiorly along the right pterion, there is also to ride 2 x 2.1 cm anterior compressed right temporal lobe cyst and vasogenic edema. The appearance is most consistent with partially calcified meningioma with adjacent right temporal lobe cyst changes and the vasogenic edema. PET scan obtained on 03/01/2016 showed hypermetabolic right upper lobe lung mass suspicious for primary bronchogenic  carcinoma, mediastinal and right hilar mass hypermetabolic nodes suspicious for metastatic disease. L1 hypermetabolism corresponding to mild compression deformity which could be the result of osteopenia versus underlying osseous metastasis.  His last cardiology follow-up was on 03/11/2016, at which time he was doing well, his heart rate was rate controlled on no rate slowing medication. 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 He was seen by Dr Melvyn Novas on 03/31/2016 who did not believe he was a surgical candidate. Patient and the family decided to proceed with conservative follow-up with repeat scan 3 months afterward. Repeat CT scan of the chest obtained on 06/02/2016 showed interval growth of peripheral RUL mass, interval growth of right hilar lymphadenopathy and possible interval worsening of basilar predominant fibrotic interstitial lung disease. Patient was seen by Dr. Melvyn Novas again on 06/05/2016 who decided to refer the patient to Dr. Osborne Casco on 06/24/2016 for possible biopsy, it was decided he is not a candidate for biopsy either. He has been seen by Dr. Lisbeth Renshaw with radiation oncology on 07/12/2016, patient was not interested in chemotherapy. He is also not interested in biopsy. Patient is currently deciding between conservative observation versus radiation treatment.  Based on outside record, patient was evaluated on 07/28/2016 for fluid accumulation. He was placed on 20 mg daily of Lasix. Patient also complained of a lot of fatigue. He was instructed to follow-up with cardiology. According to the patient, he has stopped taking his Lasix. He still has 1+ pitting edema on physical  exam. He denies any recent chest discomfort or shortness of breath. He continued to deal with significant amount of fatigue. He has agreed to radiation therapy for lung cancer and is about to start next week. Fortunately, his lung is clear, I will restart on the low dose Lasix on a daily basis. We  will also obtain a basic metabolic panel in one week.   Past Medical History:  Diagnosis Date  . Atrial fibrillation ([place]) 12/11/2015  . Benign prostatic hypertrophy   . Bilateral cataracts   . CKD (chronic kidney disease) stage 3, GFR 30-59 ml/min   . Coronary artery disease    s/p 3.0x23 cypher mid rca 2004  . Deep vein thrombosis (HCC)    Coumadin anticoagulation for deep vein thrombosis  . Greenfield filter in place    History of chronic lower extremity clots  . Hand arthritis   . Hyperlipidemia   . Hypertension     History of hypertension  . Hypotension   . Lightheadedness   . 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 Premature ventricular contractions   . Pulmonary embolism ([place])    05/2011    Past Surgical History:  Procedure Laterality Date  . CARDIAC CATHETERIZATION  10/22/2002   left heart cath  . EXCISON TOENAIL Right    HALLUX  . HIP SURGERY  2001-2001   right hip surgery  . ROTATOR CUFF REPAIR  1985   right rotator cuff surgery  . [place]   for a hernia    Current Medications: Outpatient Medications Prior to Visit  Medication Sig Dispense Refill  . calcium-vitamin D (OSCAL WITH D) 250-125 MG-UNIT per tablet Take 1 tablet by mouth daily.    . carvedilol (COREG) 3.125 MG tablet Take 1 tablet (3.125 mg total) by mouth 2 (two) times daily. 180 tablet 3  . finasteride (PROSCAR) 5 MG tablet Take 5 mg by mouth daily.    . fish oil-omega-3 fatty acids 1000 MG capsule Take 2 g by mouth daily.    . furosemide (LASIX) 20 MG tablet Take 1 tablet by mouth daily.    Marland Kitchen HYDROcodone-acetaminophen (NORCO/VICODIN) 5-325 MG tablet Take 1 tablet by mouth every 6 (six) hours as needed.    . Multiple Vitamin (MULTIVITAMIN) tablet Take 1 tablet by mouth daily.    . pravastatin (PRAVACHOL) 40 MG tablet Take 40 mg by mouth daily.    . Tamsulosin HCl (FLOMAX) 0.4 MG CAPS Take 0.4 mg by mouth daily.    . vitamin D, CHOLECALCIFEROL, 400 UNITS tablet Take 400 Units  by mouth daily.    Marland Kitchen warfarin (COUMADIN) 5 MG tablet Take 0.5-1 tablets (2.5-5 mg total) by mouth daily. Take 2.5 mg Sun / Tues / Wed / Thurs / Sat Take 5 mg on Mon / Fri 30 tablet 0   No facility-administered medications prior to visit.      Allergies:   Lisinopril   Social History   Social History  . Marital status: Married    Spouse name: N/A  . Number of children: 2  . Years of education: N/A   Occupational History  . factory  Retired    retired   Social History Main Topics  . Smoking status: Former Smoker    Packs/day: 1.00    Years: 30.00    Types: Cigarettes    Quit date: 10/12/1967  . Smokeless tobacco: Former Systems developer    Quit date: 10/20/1983  . Alcohol use 0.0 oz/week  Comment: very little  . Drug use: No  . Sexual activity: Not Asked   Other Topics Concern  . None   Social History Narrative  . None     Family History:  The patient's family history includes Tuberculosis in his mother.   ROS:   Please see the history of present illness.    ROS All other systems reviewed and are negative.   PHYSICAL EXAM:   VS:  BP 136/72   Pulse 89   Ht '5\' 7"'$  (1.702 m)   Wt 158 lb (71.7 kg)   BMI 24.75 kg/m    GEN: Well nourished, well developed, in no acute distress  HEENT: normal  Neck: no JVD, carotid bruits, or masses Cardiac: irregularly irregular; no murmurs, rubs, or gallops. 1+ pitting edema Respiratory:  clear to auscultation bilaterally, normal work of breathing GI: soft, nontender, nondistended, + BS MS: no deformity or atrophy  Skin: warm and dry, no rash Neuro:  Alert and Oriented x 3, Strength and sensation are intact Psych: euthymic mood, full affect  Wt Readings from Last 3 Encounters:  08/04/16 158 lb (71.7 kg)  07/12/16 157 lb (71.2 kg)  06/07/16 158 lb (71.7 kg)      Studies/Labs Reviewed:   EKG:  EKG is ordered today.  The ekg ordered today demonstrates Atrial fibrillation with heart rate 89.  Recent Labs: 12/29/2015: Brain  Natriuretic Peptide 412.9 02/13/2016: ALT 18; Hemoglobin 12.9; Platelets 91 03/09/2016: BUN 28; Creat 1.33; Potassium 5.5; Sodium 139   Lipid Panel No results found for: CHOL, TRIG, HDL, CHOLHDL, VLDL, LDLCALC, LDLDIRECT  Additional studies/ records that were reviewed today include:   Echo 12/15/2015 LV EF: 30% -   35%  ------------------------------------------------------------------- Indications:      (I48.91).  ------------------------------------------------------------------- History:   PMH:  Acquired from the patient and from the patient&'s chart.  Atrial fibrillation.  Coronary artery disease.  Risk factors:  Hypertension. Dyslipidemia.  ------------------------------------------------------------------- Study Conclusions  - Left ventricle: The cavity size was normal. There was mild   concentric hypertrophy. Systolic function was moderately to   severely reduced. The estimated ejection fraction was in the   range of 30% to 35%. Diffuse hypokinesis. - Aortic valve: Valve mobility was mildly restricted. There was   very mild stenosis. There was mild regurgitation. Peak velocity   (S): 216.21 cm/s. Mean gradient (S): 10 mm Hg. Valve area (VTI):   1.84 cm^2. Valve area (Vmax): 1.98 cm^2. Valve area (Vmean): 1.83   cm^2. - Mitral valve: Transvalvular velocity was within the normal range.   There was no evidence for stenosis. There was mild to moderate   regurgitation. - Left atrium: The atrium was severely dilated. - Right ventricle: The cavity size was normal. Wall thickness was   normal. Systolic function was normal. - Tricuspid valve: There was moderate regurgitation. - Pulmonic valve: There was trivial regurgitation. - Pulmonary arteries: Systolic pressure was moderately increased.   PA peak pressure: 47 mm Hg (S). - Inferior vena cava: The vessel was normal in size. The   respirophasic diameter changes were in the normal range (>= 50%),   consistent with normal  central venous pressure.   Myoview 12/24/2015 Study Highlights    There was no ST segment deviation noted during stress.  Defect 1: There is a medium defect of severe severity present in the basal inferoseptal, basal inferior, mid inferoseptal and mid inferior location.  Findings consistent with prior myocardial infarction with very mild peri-infarct ischemia in the inferior wall.  This is a low risk study.      MRI of brain 03/02/2016 IMPRESSION: Extra-axial heterogeneous 5.8 x 2.3 x 4.1 cm mass spans from the anterior lateral right middle cranial fossa superiorly along the right pterion. This abuts the right middle cerebral artery. Compression of the adjacent right frontal and temporal lobe. Within the anterior compressed right temporal lobe there is a 2 x 2 x 2.1 cm cyst and vasogenic edema.  The appearance is most consistent with a partially calcified meningioma with adjacent right temporal lobe cystic changes and vasogenic edema. If the patient's lung mass proves to be malignant than dural metastatic disease cannot be entirely excluded although felt to be a secondary less likely consideration based on MR imaging. Please no present exam was performed without contrast secondary to renal insufficiency.  Remote small cerebellar infarcts greater on the left.  Moderate chronic microvascular changes.  Global atrophy without hydrocephalus.  Diminutive size left vertebral artery.  C3-4 cervical spondylotic changes with slight cord flattening.   PET scan 03/02/2016 IMPRESSION: 1. Hypermetabolic right upper lobe lung mass, positioned within an underlying bulla. Although this could represent a mycetoma,it is suspicious for a primary bronchogenic carcinoma. 2. Mediastinal and right hilar hypermetabolic nodes, suspicious for metastatic disease. 3. No extrathoracic soft tissue metastasis identified. 4. L1 hypermetabolism corresponding to a mild compression  deformity. This could be due to osteopenia or an underlying osseous metastasis. More vague T10 and T11 hypermetabolism is indeterminate. If the patient is diagnosed with primary bronchogenic carcinoma, pre and postcontrast thoracolumbar spine MRI may be informative. 5. Incidental findings, including hiatal hernia, cholelithiasis, and advanced atherosclerosis.    CT of chest wo contrast 06/02/2016 IMPRESSION: 1. Interval growth of peripheral right upper lobe lung mass, now 4.8 cm, most consistent with primary bronchogenic carcinoma. 2. Interval growth of right hilar lymphadenopathy, suspicious for nodal metastasis. Mild right paratracheal and subcarinal mediastinal lymphadenopathy appears stable, possibly representing nodal metastases. 3. Spectrum of finding suggestive of basilar predominant fibrotic interstitial lung disease, with possible interval worsening, worrisome for usual interstitial pneumonia (UIP). 4. Moderate L1 vertebral compression fracture, with interval loss of L1 vertebral body height. 5. Additional findings include aortic atherosclerosis, mild cardiomegaly, stable dilated main pulmonary artery suggesting chronic pulmonary hypertension, left main and 3 vessel coronary atherosclerosis, moderate to severe emphysema, cholelithiasis and moderate hiatal hernia.   ASSESSMENT:    1. Chronic systolic heart failure ([place])   2. Medication management   3. Essential hypertension      PLAN:  In order of problems listed above:  1. Chronic systolic heart failure: Mild fluid accumulation on physical exam today, recently treated with a trial of Lasix, however has since stopped, he is accumulating some fluid back. I will restart on the 20 mg daily of Lasix. He will obtain a basic metabolic panel in one week.  2. Hypertension: His blood pressure is 136/72. If his blood pressure is still high on follow-up, we will try to start increasing on the carvedilol or add BiDil to his  medical regimen. Note, he is unable to tolerate lisinopril due to hyperkalemia and worsening renal function.  3. CAD: With PCI of RCA in 2004. Given recent LV dysfunction noted on echocardiogram, he did undergo stress test on 12/24/2015 which showed no significant ischemia, overall low risk study.  4. Lung CA: Workup as indicated in history of present illness, the followed by radiation oncology. According to the patient, he has agreed to undergo radiation therapy. He does not think he could tolerate  chemotherapy as he has tried it before.     Medication Adjustments/Labs and Tests Ordered: Current medicines are reviewed at length with the patient today.  Concerns regarding medicines are outlined above.  Medication changes, Labs and Tests ordered today are listed in the Patient Instructions below. Patient Instructions  Medication Instructions:  RESTART- Lasix 20 mg daily  Labwork: BMP in 1 Week  Testing/Procedures: None Ordered  Follow-Up: Your physician recommends that you schedule a follow-up appointment in: 4 Weeks with Eulas Post or Dr Martinique   Any Other Special Instructions Will Be Listed Below (If Applicable).   If you need a refill on your cardiac medications before your next appointment, please call your pharmacy.      Hilbert Corrigan, [place]  08/04/2016 1:06 PM    [facility] [address], [place], [place]  [address] Phone: [phone]; Fax: [phone]

## 2016-08-04 NOTE — Patient Instructions (Signed)
Medication Instructions:  RESTART- Lasix 20 mg daily  Labwork: BMP in 1 Week  Testing/Procedures: None Ordered  Follow-Up: Your physician recommends that you schedule a follow-up appointment in: 4 Weeks with Eulas Post or Dr Martinique   Any Other Special Instructions Will Be Listed Below (If Applicable).   If you need a refill on your cardiac medications before your next appointment, please call your pharmacy.

## 2016-08-06 ENCOUNTER — Ambulatory Visit
Admission: RE | Admit: 2016-08-06 | Discharge: 2016-08-06 | Disposition: A | Discharge status: Home / Self Care | Payer: Commercial Managed Care - HMO | Source: Ambulatory Visit | Attending: Radiation Oncology | Admitting: Radiation Oncology

## 2016-08-06 DIAGNOSIS — I4891 Unspecified atrial fibrillation: Secondary | ICD-10-CM | POA: Diagnosis not present

## 2016-08-06 DIAGNOSIS — I2510 Atherosclerotic heart disease of native coronary artery without angina pectoris: Secondary | ICD-10-CM | POA: Diagnosis not present

## 2016-08-06 DIAGNOSIS — C3411 Malignant neoplasm of upper lobe, right bronchus or lung: Secondary | ICD-10-CM

## 2016-08-06 DIAGNOSIS — Z510 Encounter for antineoplastic radiation therapy: Secondary | ICD-10-CM | POA: Diagnosis not present

## 2016-08-06 DIAGNOSIS — Z7901 Long term (current) use of anticoagulants: Secondary | ICD-10-CM | POA: Diagnosis not present

## 2016-08-06 DIAGNOSIS — E785 Hyperlipidemia, unspecified: Secondary | ICD-10-CM | POA: Diagnosis not present

## 2016-08-06 DIAGNOSIS — N183 Chronic kidney disease, stage 3 (moderate): Secondary | ICD-10-CM | POA: Diagnosis not present

## 2016-08-06 DIAGNOSIS — I129 Hypertensive chronic kidney disease with stage 1 through stage 4 chronic kidney disease, or unspecified chronic kidney disease: Secondary | ICD-10-CM | POA: Diagnosis not present

## 2016-08-06 DIAGNOSIS — N400 Enlarged prostate without lower urinary tract symptoms: Secondary | ICD-10-CM | POA: Diagnosis not present

## 2016-08-09 ENCOUNTER — Encounter: Payer: Self-pay | Admitting: Internal Medicine

## 2016-08-11 ENCOUNTER — Other Ambulatory Visit: Payer: Self-pay

## 2016-08-11 DIAGNOSIS — Z79899 Other long term (current) drug therapy: Secondary | ICD-10-CM | POA: Diagnosis not present

## 2016-08-11 LAB — BASIC METABOLIC PANEL
BUN: 32 mg/dL — AB (ref 7–25)
CALCIUM: 9 mg/dL (ref 8.6–10.3)
CO2: 26 mmol/L (ref 20–31)
CREATININE: 1.58 mg/dL — AB (ref 0.70–1.11)
Chloride: 102 mmol/L (ref 98–110)
Glucose, Bld: 130 mg/dL — ABNORMAL HIGH (ref 65–99)
POTASSIUM: 4.8 mmol/L (ref 3.5–5.3)
Sodium: 139 mmol/L (ref 135–146)

## 2016-08-12 DIAGNOSIS — I4891 Unspecified atrial fibrillation: Secondary | ICD-10-CM | POA: Diagnosis not present

## 2016-08-12 DIAGNOSIS — R2689 Other abnormalities of gait and mobility: Secondary | ICD-10-CM | POA: Diagnosis not present

## 2016-08-12 DIAGNOSIS — Z7901 Long term (current) use of anticoagulants: Secondary | ICD-10-CM | POA: Diagnosis not present

## 2016-08-12 NOTE — Addendum Note (Signed)
Addended by: Therisa Doyne on: 08/12/2016 01:37 PM   Modules accepted: Orders

## 2016-08-13 DIAGNOSIS — C3411 Malignant neoplasm of upper lobe, right bronchus or lung: Secondary | ICD-10-CM | POA: Diagnosis not present

## 2016-08-13 DIAGNOSIS — Z510 Encounter for antineoplastic radiation therapy: Secondary | ICD-10-CM | POA: Diagnosis not present

## 2016-08-13 DIAGNOSIS — I4891 Unspecified atrial fibrillation: Secondary | ICD-10-CM | POA: Diagnosis not present

## 2016-08-13 DIAGNOSIS — I2510 Atherosclerotic heart disease of native coronary artery without angina pectoris: Secondary | ICD-10-CM | POA: Diagnosis not present

## 2016-08-13 DIAGNOSIS — N183 Chronic kidney disease, stage 3 (moderate): Secondary | ICD-10-CM | POA: Diagnosis not present

## 2016-08-13 DIAGNOSIS — I129 Hypertensive chronic kidney disease with stage 1 through stage 4 chronic kidney disease, or unspecified chronic kidney disease: Secondary | ICD-10-CM | POA: Diagnosis not present

## 2016-08-13 DIAGNOSIS — N400 Enlarged prostate without lower urinary tract symptoms: Secondary | ICD-10-CM | POA: Diagnosis not present

## 2016-08-13 DIAGNOSIS — E785 Hyperlipidemia, unspecified: Secondary | ICD-10-CM | POA: Diagnosis not present

## 2016-08-13 DIAGNOSIS — Z7901 Long term (current) use of anticoagulants: Secondary | ICD-10-CM | POA: Diagnosis not present

## 2016-08-16 ENCOUNTER — Ambulatory Visit: Payer: Commercial Managed Care - HMO | Admitting: Radiation Oncology

## 2016-08-17 ENCOUNTER — Ambulatory Visit
Admission: RE | Admit: 2016-08-17 | Discharge: 2016-08-17 | Disposition: A | Discharge status: Home / Self Care | Payer: Commercial Managed Care - HMO | Source: Ambulatory Visit | Attending: Radiation Oncology | Admitting: Radiation Oncology

## 2016-08-17 DIAGNOSIS — I2510 Atherosclerotic heart disease of native coronary artery without angina pectoris: Secondary | ICD-10-CM | POA: Diagnosis not present

## 2016-08-17 DIAGNOSIS — N183 Chronic kidney disease, stage 3 (moderate): Secondary | ICD-10-CM | POA: Diagnosis not present

## 2016-08-17 DIAGNOSIS — E785 Hyperlipidemia, unspecified: Secondary | ICD-10-CM | POA: Diagnosis not present

## 2016-08-17 DIAGNOSIS — Z510 Encounter for antineoplastic radiation therapy: Secondary | ICD-10-CM | POA: Diagnosis not present

## 2016-08-17 DIAGNOSIS — N400 Enlarged prostate without lower urinary tract symptoms: Secondary | ICD-10-CM | POA: Diagnosis not present

## 2016-08-17 DIAGNOSIS — C3411 Malignant neoplasm of upper lobe, right bronchus or lung: Secondary | ICD-10-CM | POA: Diagnosis not present

## 2016-08-17 DIAGNOSIS — I129 Hypertensive chronic kidney disease with stage 1 through stage 4 chronic kidney disease, or unspecified chronic kidney disease: Secondary | ICD-10-CM | POA: Diagnosis not present

## 2016-08-17 DIAGNOSIS — I4891 Unspecified atrial fibrillation: Secondary | ICD-10-CM | POA: Diagnosis not present

## 2016-08-17 DIAGNOSIS — Z7901 Long term (current) use of anticoagulants: Secondary | ICD-10-CM | POA: Diagnosis not present

## 2016-08-18 ENCOUNTER — Encounter: Payer: Self-pay | Admitting: Radiation Oncology

## 2016-08-18 ENCOUNTER — Ambulatory Visit
Admission: RE | Admit: 2016-08-18 | Discharge: 2016-08-18 | Disposition: A | Discharge status: Home / Self Care | Payer: Commercial Managed Care - HMO | Source: Ambulatory Visit | Attending: Radiation Oncology | Admitting: Radiation Oncology

## 2016-08-18 VITALS — BP 111/72 | HR 81 | Temp 97.6°F | Resp 20 | Wt 158.4 lb

## 2016-08-18 DIAGNOSIS — C3411 Malignant neoplasm of upper lobe, right bronchus or lung: Secondary | ICD-10-CM | POA: Diagnosis not present

## 2016-08-18 DIAGNOSIS — N400 Enlarged prostate without lower urinary tract symptoms: Secondary | ICD-10-CM | POA: Diagnosis not present

## 2016-08-18 DIAGNOSIS — I4891 Unspecified atrial fibrillation: Secondary | ICD-10-CM | POA: Diagnosis not present

## 2016-08-18 DIAGNOSIS — Z510 Encounter for antineoplastic radiation therapy: Secondary | ICD-10-CM | POA: Diagnosis not present

## 2016-08-18 DIAGNOSIS — I129 Hypertensive chronic kidney disease with stage 1 through stage 4 chronic kidney disease, or unspecified chronic kidney disease: Secondary | ICD-10-CM | POA: Diagnosis not present

## 2016-08-18 DIAGNOSIS — N183 Chronic kidney disease, stage 3 (moderate): Secondary | ICD-10-CM | POA: Diagnosis not present

## 2016-08-18 DIAGNOSIS — I2510 Atherosclerotic heart disease of native coronary artery without angina pectoris: Secondary | ICD-10-CM | POA: Diagnosis not present

## 2016-08-18 DIAGNOSIS — E785 Hyperlipidemia, unspecified: Secondary | ICD-10-CM | POA: Diagnosis not present

## 2016-08-18 DIAGNOSIS — Z7901 Long term (current) use of anticoagulants: Secondary | ICD-10-CM | POA: Diagnosis not present

## 2016-08-18 MED ORDER — SONAFINE EX EMUL
1.0000 "application " | Freq: Two times a day (BID) | CUTANEOUS | Status: DC
  Administered 2016-08-18: 1 via TOPICAL

## 2016-08-18 NOTE — Progress Notes (Signed)
Weekly rad xs right lung 2/10 completed, coughs up white phelgm occasionally,feels better this week, does have trouble getting pills down at night stated,  in w/c, no nausea or pain, no difficulty swallowing foods/fluids, patient education done, discussed ways to manage side effects, may need to eat 5-6 smaller softrmeals and snacks in between, fatigue, pain, use sonafine cream when skin becomes irritated or itch after rad txs,  Verbal understanding,  2:43 PM BP 111/72 (BP Location: Left Arm, Patient Position: Sitting, Cuff Size: Normal)   Pulse 81   Temp 97.6 F (36.4 C) (Oral)   Resp 20   Wt 158 lb 6.4 oz (71.8 kg)   BMI 24.81 kg/m   Wt Readings from Last 3 Encounters:  08/18/16 158 lb 6.4 oz (71.8 kg)  08/04/16 158 lb (71.7 kg)  07/12/16 157 lb (71.2 kg)

## 2016-08-18 NOTE — Progress Notes (Signed)
Department of Radiation Oncology  Phone:  [phone] Fax:        [phone]  Weekly Treatment Note    Name: Darrell Mendoza Date: 08/18/2016 MRN: [id number] DOB: 1924/12/23   Diagnosis:     ICD-9-CM ICD-10-CM   1. Malignant neoplasm of right upper lobe of lung (HCC) 162.3 C34.11 SONAFINE emulsion 1 application     Current dose: 6 Gy  Current fraction: 2   MEDICATIONS: Current Outpatient Prescriptions  Medication Sig Dispense Refill  . calcium-vitamin D (OSCAL WITH D) 250-125 MG-UNIT per tablet Take 1 tablet by mouth daily.    . carvedilol (COREG) 3.125 MG tablet Take 1 tablet (3.125 mg total) by mouth 2 (two) times daily. 180 tablet 3  . finasteride (PROSCAR) 5 MG tablet Take 5 mg by mouth daily.    . fish oil-omega-3 fatty acids 1000 MG capsule Take 2 g by mouth daily.    . furosemide (LASIX) 20 MG tablet Take 1 tablet by mouth daily.    Marland Kitchen HYDROcodone-acetaminophen (NORCO/VICODIN) 5-325 MG tablet Take 1 tablet by mouth every 6 (six) hours as needed.    . Multiple Vitamin (MULTIVITAMIN) tablet Take 1 tablet by mouth daily.    . pravastatin (PRAVACHOL) 40 MG tablet Take 40 mg by mouth daily.    . Tamsulosin HCl (FLOMAX) 0.4 MG CAPS Take 0.4 mg by mouth daily.    . vitamin D, CHOLECALCIFEROL, 400 UNITS tablet Take 400 Units by mouth daily.    Marland Kitchen warfarin (COUMADIN) 5 MG tablet Take 0.5-1 tablets (2.5-5 mg total) by mouth daily. Take 2.5 mg Sun / Tues / Wed / Thurs / Sat Take 5 mg on Mon / Fri 30 tablet 0  . Wound Dressings (SONAFINE) Apply 1 application topically daily.     Current Facility-Administered Medications  Medication Dose Route Frequency Provider Last Rate Last Dose  . SONAFINE emulsion 1 application  1 application Topical BID Hayden Pedro, PA-C   1 application at 65/78/46 1440     ALLERGIES: Lisinopril   LABORATORY DATA:  Lab Results  Component Value Date   WBC 3.1 (L) 02/13/2016   HGB 12.9 (L) 02/13/2016   HCT 39.6 02/13/2016   MCV 94.5  02/13/2016   PLT 91 (L) 02/13/2016   Lab Results  Component Value Date   NA 139 08/11/2016   K 4.8 08/11/2016   CL 102 08/11/2016   CO2 26 08/11/2016   Lab Results  Component Value Date   ALT 18 02/13/2016   AST 24 02/13/2016   ALKPHOS 53 02/13/2016   BILITOT 0.7 02/13/2016     NARRATIVE: Darrell Mendoza was seen today for weekly treatment management. The chart was checked and the patient's films were reviewed.  Weekly radiation treatment to the right lung 2/10 completed. Reports coughing up white phlegm occasionally. She is feeling better this week. Reports she does have trouble getting pills down at night. She presents in wheelchair. Denies nausea or pain. Denies difficulty swallowing foods/fluids. Reports fatigue. Using sonafine cream when skin becomes irritated or itch after radiation treatment. States his breathing is good.   PHYSICAL EXAMINATION: weight is 158 lb 6.4 oz (71.8 kg). His oral temperature is 97.6 F (36.4 C). His blood pressure is 111/72 and his pulse is 81. His respiration is 20.      Alert and in no acute distress. In wheelchair.   ASSESSMENT: The patient is doing satisfactorily with treatment.  PLAN: We will continue with the patient's radiation treatment as planned.     ------------------------------------------------  Jodelle Gross, MD, PhD  This document serves as a record of services personally performed by Kyung Rudd, MD. It was created on his behalf by Arlyce Harman, a trained medical scribe. The creation of this record is based on the scribe's personal observations and the provider's statements to them. This document has been checked and approved by the attending provider.

## 2016-08-19 ENCOUNTER — Ambulatory Visit
Admission: RE | Admit: 2016-08-19 | Discharge: 2016-08-19 | Disposition: A | Discharge status: Home / Self Care | Payer: Commercial Managed Care - HMO | Source: Ambulatory Visit | Attending: Radiation Oncology | Admitting: Radiation Oncology

## 2016-08-19 DIAGNOSIS — Z7901 Long term (current) use of anticoagulants: Secondary | ICD-10-CM | POA: Diagnosis not present

## 2016-08-19 DIAGNOSIS — E785 Hyperlipidemia, unspecified: Secondary | ICD-10-CM | POA: Diagnosis not present

## 2016-08-19 DIAGNOSIS — I129 Hypertensive chronic kidney disease with stage 1 through stage 4 chronic kidney disease, or unspecified chronic kidney disease: Secondary | ICD-10-CM | POA: Diagnosis not present

## 2016-08-19 DIAGNOSIS — I4891 Unspecified atrial fibrillation: Secondary | ICD-10-CM | POA: Diagnosis not present

## 2016-08-19 DIAGNOSIS — N183 Chronic kidney disease, stage 3 (moderate): Secondary | ICD-10-CM | POA: Diagnosis not present

## 2016-08-19 DIAGNOSIS — Z510 Encounter for antineoplastic radiation therapy: Secondary | ICD-10-CM | POA: Diagnosis not present

## 2016-08-19 DIAGNOSIS — N400 Enlarged prostate without lower urinary tract symptoms: Secondary | ICD-10-CM | POA: Diagnosis not present

## 2016-08-19 DIAGNOSIS — C3411 Malignant neoplasm of upper lobe, right bronchus or lung: Secondary | ICD-10-CM | POA: Diagnosis not present

## 2016-08-19 DIAGNOSIS — I2510 Atherosclerotic heart disease of native coronary artery without angina pectoris: Secondary | ICD-10-CM | POA: Diagnosis not present

## 2016-08-20 ENCOUNTER — Ambulatory Visit
Admission: RE | Admit: 2016-08-20 | Discharge: 2016-08-20 | Disposition: A | Discharge status: Home / Self Care | Payer: Commercial Managed Care - HMO | Source: Ambulatory Visit | Attending: Radiation Oncology | Admitting: Radiation Oncology

## 2016-08-20 DIAGNOSIS — I129 Hypertensive chronic kidney disease with stage 1 through stage 4 chronic kidney disease, or unspecified chronic kidney disease: Secondary | ICD-10-CM | POA: Diagnosis not present

## 2016-08-20 DIAGNOSIS — N400 Enlarged prostate without lower urinary tract symptoms: Secondary | ICD-10-CM | POA: Diagnosis not present

## 2016-08-20 DIAGNOSIS — C3411 Malignant neoplasm of upper lobe, right bronchus or lung: Secondary | ICD-10-CM | POA: Diagnosis not present

## 2016-08-20 DIAGNOSIS — Z510 Encounter for antineoplastic radiation therapy: Secondary | ICD-10-CM | POA: Diagnosis not present

## 2016-08-20 DIAGNOSIS — I2510 Atherosclerotic heart disease of native coronary artery without angina pectoris: Secondary | ICD-10-CM | POA: Diagnosis not present

## 2016-08-20 DIAGNOSIS — E785 Hyperlipidemia, unspecified: Secondary | ICD-10-CM | POA: Diagnosis not present

## 2016-08-20 DIAGNOSIS — I4891 Unspecified atrial fibrillation: Secondary | ICD-10-CM | POA: Diagnosis not present

## 2016-08-20 DIAGNOSIS — Z7901 Long term (current) use of anticoagulants: Secondary | ICD-10-CM | POA: Diagnosis not present

## 2016-08-20 DIAGNOSIS — N183 Chronic kidney disease, stage 3 (moderate): Secondary | ICD-10-CM | POA: Diagnosis not present

## 2016-08-23 ENCOUNTER — Ambulatory Visit
Admission: RE | Admit: 2016-08-23 | Discharge: 2016-08-23 | Disposition: A | Discharge status: Home / Self Care | Payer: Commercial Managed Care - HMO | Source: Ambulatory Visit | Attending: Radiation Oncology | Admitting: Radiation Oncology

## 2016-08-23 DIAGNOSIS — I129 Hypertensive chronic kidney disease with stage 1 through stage 4 chronic kidney disease, or unspecified chronic kidney disease: Secondary | ICD-10-CM | POA: Diagnosis not present

## 2016-08-23 DIAGNOSIS — N400 Enlarged prostate without lower urinary tract symptoms: Secondary | ICD-10-CM | POA: Diagnosis not present

## 2016-08-23 DIAGNOSIS — I4891 Unspecified atrial fibrillation: Secondary | ICD-10-CM | POA: Diagnosis not present

## 2016-08-23 DIAGNOSIS — Z510 Encounter for antineoplastic radiation therapy: Secondary | ICD-10-CM | POA: Diagnosis not present

## 2016-08-23 DIAGNOSIS — I2510 Atherosclerotic heart disease of native coronary artery without angina pectoris: Secondary | ICD-10-CM | POA: Diagnosis not present

## 2016-08-23 DIAGNOSIS — N183 Chronic kidney disease, stage 3 (moderate): Secondary | ICD-10-CM | POA: Diagnosis not present

## 2016-08-23 DIAGNOSIS — E785 Hyperlipidemia, unspecified: Secondary | ICD-10-CM | POA: Diagnosis not present

## 2016-08-23 DIAGNOSIS — Z7901 Long term (current) use of anticoagulants: Secondary | ICD-10-CM | POA: Diagnosis not present

## 2016-08-23 DIAGNOSIS — C3411 Malignant neoplasm of upper lobe, right bronchus or lung: Secondary | ICD-10-CM | POA: Diagnosis not present

## 2016-08-24 ENCOUNTER — Ambulatory Visit
Admission: RE | Admit: 2016-08-24 | Discharge: 2016-08-24 | Disposition: A | Discharge status: Home / Self Care | Payer: Commercial Managed Care - HMO | Source: Ambulatory Visit | Attending: Radiation Oncology | Admitting: Radiation Oncology

## 2016-08-24 DIAGNOSIS — I4891 Unspecified atrial fibrillation: Secondary | ICD-10-CM | POA: Diagnosis not present

## 2016-08-24 DIAGNOSIS — E785 Hyperlipidemia, unspecified: Secondary | ICD-10-CM | POA: Diagnosis not present

## 2016-08-24 DIAGNOSIS — N183 Chronic kidney disease, stage 3 (moderate): Secondary | ICD-10-CM | POA: Diagnosis not present

## 2016-08-24 DIAGNOSIS — Z7901 Long term (current) use of anticoagulants: Secondary | ICD-10-CM | POA: Diagnosis not present

## 2016-08-24 DIAGNOSIS — I129 Hypertensive chronic kidney disease with stage 1 through stage 4 chronic kidney disease, or unspecified chronic kidney disease: Secondary | ICD-10-CM | POA: Diagnosis not present

## 2016-08-24 DIAGNOSIS — C3411 Malignant neoplasm of upper lobe, right bronchus or lung: Secondary | ICD-10-CM | POA: Diagnosis not present

## 2016-08-24 DIAGNOSIS — Z510 Encounter for antineoplastic radiation therapy: Secondary | ICD-10-CM | POA: Diagnosis not present

## 2016-08-24 DIAGNOSIS — N400 Enlarged prostate without lower urinary tract symptoms: Secondary | ICD-10-CM | POA: Diagnosis not present

## 2016-08-24 DIAGNOSIS — I2510 Atherosclerotic heart disease of native coronary artery without angina pectoris: Secondary | ICD-10-CM | POA: Diagnosis not present

## 2016-08-25 ENCOUNTER — Ambulatory Visit
Admission: RE | Admit: 2016-08-25 | Discharge: 2016-08-25 | Disposition: A | Discharge status: Home / Self Care | Payer: Commercial Managed Care - HMO | Source: Ambulatory Visit | Attending: Radiation Oncology | Admitting: Radiation Oncology

## 2016-08-25 DIAGNOSIS — I4891 Unspecified atrial fibrillation: Secondary | ICD-10-CM | POA: Diagnosis not present

## 2016-08-25 DIAGNOSIS — N183 Chronic kidney disease, stage 3 (moderate): Secondary | ICD-10-CM | POA: Diagnosis not present

## 2016-08-25 DIAGNOSIS — C3411 Malignant neoplasm of upper lobe, right bronchus or lung: Secondary | ICD-10-CM | POA: Diagnosis not present

## 2016-08-25 DIAGNOSIS — N400 Enlarged prostate without lower urinary tract symptoms: Secondary | ICD-10-CM | POA: Diagnosis not present

## 2016-08-25 DIAGNOSIS — Z510 Encounter for antineoplastic radiation therapy: Secondary | ICD-10-CM | POA: Diagnosis not present

## 2016-08-25 DIAGNOSIS — I2510 Atherosclerotic heart disease of native coronary artery without angina pectoris: Secondary | ICD-10-CM | POA: Diagnosis not present

## 2016-08-25 DIAGNOSIS — I129 Hypertensive chronic kidney disease with stage 1 through stage 4 chronic kidney disease, or unspecified chronic kidney disease: Secondary | ICD-10-CM | POA: Diagnosis not present

## 2016-08-25 DIAGNOSIS — Z7901 Long term (current) use of anticoagulants: Secondary | ICD-10-CM | POA: Diagnosis not present

## 2016-08-25 DIAGNOSIS — E785 Hyperlipidemia, unspecified: Secondary | ICD-10-CM | POA: Diagnosis not present

## 2016-08-26 ENCOUNTER — Ambulatory Visit
Admission: RE | Admit: 2016-08-26 | Discharge: 2016-08-26 | Disposition: A | Discharge status: Home / Self Care | Payer: Commercial Managed Care - HMO | Source: Ambulatory Visit | Attending: Radiation Oncology | Admitting: Radiation Oncology

## 2016-08-26 DIAGNOSIS — Z7901 Long term (current) use of anticoagulants: Secondary | ICD-10-CM | POA: Diagnosis not present

## 2016-08-26 DIAGNOSIS — Z510 Encounter for antineoplastic radiation therapy: Secondary | ICD-10-CM | POA: Diagnosis not present

## 2016-08-26 DIAGNOSIS — I129 Hypertensive chronic kidney disease with stage 1 through stage 4 chronic kidney disease, or unspecified chronic kidney disease: Secondary | ICD-10-CM | POA: Diagnosis not present

## 2016-08-26 DIAGNOSIS — I4891 Unspecified atrial fibrillation: Secondary | ICD-10-CM | POA: Diagnosis not present

## 2016-08-26 DIAGNOSIS — E785 Hyperlipidemia, unspecified: Secondary | ICD-10-CM | POA: Diagnosis not present

## 2016-08-26 DIAGNOSIS — C3411 Malignant neoplasm of upper lobe, right bronchus or lung: Secondary | ICD-10-CM | POA: Diagnosis not present

## 2016-08-26 DIAGNOSIS — N183 Chronic kidney disease, stage 3 (moderate): Secondary | ICD-10-CM | POA: Diagnosis not present

## 2016-08-26 DIAGNOSIS — I2510 Atherosclerotic heart disease of native coronary artery without angina pectoris: Secondary | ICD-10-CM | POA: Diagnosis not present

## 2016-08-26 DIAGNOSIS — N400 Enlarged prostate without lower urinary tract symptoms: Secondary | ICD-10-CM | POA: Diagnosis not present

## 2016-08-27 ENCOUNTER — Ambulatory Visit
Admission: RE | Admit: 2016-08-27 | Discharge: 2016-08-27 | Disposition: A | Discharge status: Home / Self Care | Payer: Commercial Managed Care - HMO | Source: Ambulatory Visit | Attending: Radiation Oncology | Admitting: Radiation Oncology

## 2016-08-27 ENCOUNTER — Encounter: Payer: Self-pay | Admitting: Radiation Oncology

## 2016-08-27 ENCOUNTER — Ambulatory Visit: Payer: Commercial Managed Care - HMO

## 2016-08-27 ENCOUNTER — Encounter: Payer: Self-pay | Admitting: *Deleted

## 2016-08-27 VITALS — BP 121/66 | HR 100 | Temp 98.9°F | Resp 20 | Wt 154.8 lb

## 2016-08-27 DIAGNOSIS — I2510 Atherosclerotic heart disease of native coronary artery without angina pectoris: Secondary | ICD-10-CM | POA: Diagnosis not present

## 2016-08-27 DIAGNOSIS — C3411 Malignant neoplasm of upper lobe, right bronchus or lung: Secondary | ICD-10-CM

## 2016-08-27 DIAGNOSIS — E785 Hyperlipidemia, unspecified: Secondary | ICD-10-CM | POA: Diagnosis not present

## 2016-08-27 DIAGNOSIS — I129 Hypertensive chronic kidney disease with stage 1 through stage 4 chronic kidney disease, or unspecified chronic kidney disease: Secondary | ICD-10-CM | POA: Diagnosis not present

## 2016-08-27 DIAGNOSIS — N400 Enlarged prostate without lower urinary tract symptoms: Secondary | ICD-10-CM | POA: Diagnosis not present

## 2016-08-27 DIAGNOSIS — N183 Chronic kidney disease, stage 3 (moderate): Secondary | ICD-10-CM | POA: Diagnosis not present

## 2016-08-27 DIAGNOSIS — Z7901 Long term (current) use of anticoagulants: Secondary | ICD-10-CM | POA: Diagnosis not present

## 2016-08-27 DIAGNOSIS — Z510 Encounter for antineoplastic radiation therapy: Secondary | ICD-10-CM | POA: Diagnosis not present

## 2016-08-27 DIAGNOSIS — I4891 Unspecified atrial fibrillation: Secondary | ICD-10-CM | POA: Diagnosis not present

## 2016-08-27 NOTE — Progress Notes (Addendum)
Weekly rad txs right lung, 9/10 completed, no skin changes, not using sonafine cream, in w/c, appetite fair,  Fatigue is some better stated, coughs up  whitwe phelgm  Occasionally 1 month f/u appt with Shona Simpson, PA, 10/19/2016,  lost his daughter to pneumonia this past Wednesday 2:46 PM BP 121/66 (BP Location: Left Arm, Patient Position: Sitting, Cuff Size: Normal)   Pulse 100   Temp 98.9 F (37.2 C) (Oral)   Resp 20   Wt 154 lb 12.8 oz (70.2 kg)   SpO2 96% Comment: room air  BMI 24.25 kg/m   Wt Readings from Last 3 Encounters:  08/27/16 154 lb 12.8 oz (70.2 kg)  08/18/16 158 lb 6.4 oz (71.8 kg)  08/04/16 158 lb (71.7 kg)

## 2016-08-27 NOTE — Progress Notes (Signed)
[facility] Psychosocial Distress Screening Clinical Social Work  Clinical Social Work was referred by distress screening protocol.  The patient scored a 6 on the Psychosocial Distress Thermometer which indicates moderate distress. Clinical Social Worker contacted patient/spouse by phone to assess for distress and other psychosocial needs. Patient's spouse reports there are no concerns at this time.  Per spouse report, Darrell Mendoza is tolerating treatment well and has no side effects at this time.  She reported no practical or emotional concerns at this time.  CSW encouraged patient/spouse to follow up with CSW in the future as needed.  ONCBCN DISTRESS SCREENING 07/12/2016  Screening Type Initial Screening  Distress experienced in past week (1-10) 6  Practical problem type Transportation  Family Problem type Partner  Emotional problem type Depression;Adjusting to illness;Boredom  Spiritual/Religous concerns type Facing my mortality  Information Concerns Type Lack of info about diagnosis;Lack of info about treatment;Lack of info about complementary therapy choices;Lack of info about maintaining fitness  Physical Problem type Pain;Sleep/insomnia;Getting around;Bathing/dressing;Loss of appetitie;Tingling hands/feet;Swollen arms/legs  Physician notified of physical symptoms Yes  Referral to clinical social work Yes  Referral to dietition Yes    Polo Riley, MSW, LCSW, OSW-C Clinical Social Worker [place] [phone]

## 2016-08-29 ENCOUNTER — Ambulatory Visit
Admission: RE | Admit: 2016-08-29 | Discharge: 2016-08-29 | Disposition: A | Discharge status: Home / Self Care | Payer: Commercial Managed Care - HMO | Source: Ambulatory Visit | Attending: Radiation Oncology | Admitting: Radiation Oncology

## 2016-08-29 ENCOUNTER — Encounter: Payer: Self-pay | Admitting: Radiation Oncology

## 2016-08-29 DIAGNOSIS — I2510 Atherosclerotic heart disease of native coronary artery without angina pectoris: Secondary | ICD-10-CM | POA: Diagnosis not present

## 2016-08-29 DIAGNOSIS — I4891 Unspecified atrial fibrillation: Secondary | ICD-10-CM | POA: Diagnosis not present

## 2016-08-29 DIAGNOSIS — C3411 Malignant neoplasm of upper lobe, right bronchus or lung: Secondary | ICD-10-CM | POA: Diagnosis not present

## 2016-08-29 DIAGNOSIS — I129 Hypertensive chronic kidney disease with stage 1 through stage 4 chronic kidney disease, or unspecified chronic kidney disease: Secondary | ICD-10-CM | POA: Diagnosis not present

## 2016-08-29 DIAGNOSIS — N183 Chronic kidney disease, stage 3 (moderate): Secondary | ICD-10-CM | POA: Diagnosis not present

## 2016-08-29 DIAGNOSIS — Z510 Encounter for antineoplastic radiation therapy: Secondary | ICD-10-CM | POA: Diagnosis not present

## 2016-08-29 DIAGNOSIS — N400 Enlarged prostate without lower urinary tract symptoms: Secondary | ICD-10-CM | POA: Diagnosis not present

## 2016-08-29 DIAGNOSIS — Z7901 Long term (current) use of anticoagulants: Secondary | ICD-10-CM | POA: Diagnosis not present

## 2016-08-29 DIAGNOSIS — E785 Hyperlipidemia, unspecified: Secondary | ICD-10-CM | POA: Diagnosis not present

## 2016-08-29 NOTE — Progress Notes (Signed)
  Radiation Oncology         [phone] ________________________________  Name: Vignesh Willert MRN: [id number]  Date: 08/06/2016  DOB: 1925-08-23  SIMULATION AND TREATMENT PLANNING NOTE  DIAGNOSIS:     ICD-9-CM ICD-10-CM   1. Malignant neoplasm of right upper lobe of lung ([place]) 162.3 C34.11      Site:  Right lung  NARRATIVE:  The patient was brought to the [facility].  Identity was confirmed.  All relevant records and images related to the planned course of therapy were reviewed.   Written consent to proceed with treatment was confirmed which was freely given after reviewing the details related to the planned course of therapy had been reviewed with the patient.  Then, the patient was set-up in a stable reproducible  supine position for radiation therapy.  CT images were obtained.  Surface markings were placed.    Medically necessary complex treatment device(s) for immobilization:  N/A.   The CT images were loaded into the planning software.  Then the target and avoidance structures were contoured.  Treatment planning then occurred.  The radiation prescription was entered and confirmed.  A total of 3 complex treatment devices were fabricated which relate to the designed radiation treatment fields. Each of these customized fields/ complex treatment devices will be used on a daily basis during the radiation course. I have requested : 3D Simulation  I have requested a DVH of the following structures: Target volume, spinal cord, lungs.   PLAN:  The patient will receive 30 Gy in 10 fractions.  ________________________________   Jodelle Gross, MD, PhD

## 2016-08-29 NOTE — Progress Notes (Signed)
Department of Radiation Oncology  Phone:  [phone] Fax:        [phone]  Weekly Treatment Note    Name: Darrell Mendoza Date: 08/29/2016 MRN: [id number] DOB: 1924-12-09   Diagnosis:     ICD-9-CM ICD-10-CM   1. Malignant neoplasm of right upper lobe of lung ([place]) 162.3 C34.11      Current dose: 27 Gy  Current fraction: 9   MEDICATIONS: Current Outpatient Prescriptions  Medication Sig Dispense Refill  . calcium-vitamin D (OSCAL WITH D) 250-125 MG-UNIT per tablet Take 1 tablet by mouth daily.    . carvedilol (COREG) 3.125 MG tablet Take 1 tablet (3.125 mg total) by mouth 2 (two) times daily. 180 tablet 3  . finasteride (PROSCAR) 5 MG tablet Take 5 mg by mouth daily.    . fish oil-omega-3 fatty acids 1000 MG capsule Take 2 g by mouth daily.    . furosemide (LASIX) 20 MG tablet Take 1 tablet by mouth daily.    Marland Kitchen HYDROcodone-acetaminophen (NORCO/VICODIN) 5-325 MG tablet Take 1 tablet by mouth every 6 (six) hours as needed.    . Multiple Vitamin (MULTIVITAMIN) tablet Take 1 tablet by mouth daily.    . pravastatin (PRAVACHOL) 40 MG tablet Take 40 mg by mouth daily.    . Tamsulosin HCl (FLOMAX) 0.4 MG CAPS Take 0.4 mg by mouth daily.    . vitamin D, CHOLECALCIFEROL, 400 UNITS tablet Take 400 Units by mouth daily.    Marland Kitchen warfarin (COUMADIN) 5 MG tablet Take 0.5-1 tablets (2.5-5 mg total) by mouth daily. Take 2.5 mg Sun / Tues / Wed / Thurs / Sat Take 5 mg on Mon / Fri 30 tablet 0  . Wound Dressings (SONAFINE) Apply 1 application topically daily.     No current facility-administered medications for this encounter.      ALLERGIES: Lisinopril   LABORATORY DATA:  Lab Results  Component Value Date   WBC 3.1 (L) 02/13/2016   HGB 12.9 (L) 02/13/2016   HCT 39.6 02/13/2016   MCV 94.5 02/13/2016   PLT 91 (L) 02/13/2016   Lab Results  Component Value Date   NA 139 08/11/2016   K 4.8 08/11/2016   CL 102 08/11/2016   CO2 26 08/11/2016   Lab Results  Component Value  Date   ALT 18 02/13/2016   AST 24 02/13/2016   ALKPHOS 53 02/13/2016   BILITOT 0.7 02/13/2016     NARRATIVE: Darrell Mendoza was seen today for weekly treatment management. The chart was checked and the patient's films were reviewed.  Weekly rad txs right lung, 9/10 completed, no skin changes, not using sonafine cream, in w/c, appetite fair,  Fatigue is some better stated, coughs up  whitwe phelgm  Occasionally 1 month f/u appt with Shona Simpson, PA, 10/19/2016,  lost his daughter to pneumonia this past Wednesday 5:56 PM BP 121/66 (BP Location: Left Arm, Patient Position: Sitting, Cuff Size: Normal)   Pulse 100   Temp 98.9 F (37.2 C) (Oral)   Resp 20   Wt 154 lb 12.8 oz (70.2 kg)   SpO2 96% Comment: room air  BMI 24.25 kg/m   Wt Readings from Last 3 Encounters:  08/27/16 154 lb 12.8 oz (70.2 kg)  08/18/16 158 lb 6.4 oz (71.8 kg)  08/04/16 158 lb (71.7 kg)    PHYSICAL EXAMINATION: weight is 154 lb 12.8 oz (70.2 kg). His oral temperature is 98.9 F (37.2 C). His blood pressure is 121/66 and his pulse is 100. His respiration is  20 and oxygen saturation is 96%.        ASSESSMENT: The patient is doing satisfactorily with treatment. The patient appears to be handling the terrible loss of his daughter as well as can be expected.  PLAN: We will continue with the patient's radiation treatment as planned.  He will then follow-up one month after completing treatment.

## 2016-08-30 ENCOUNTER — Ambulatory Visit: Payer: Commercial Managed Care - HMO

## 2016-09-01 ENCOUNTER — Ambulatory Visit (INDEPENDENT_AMBULATORY_CARE_PROVIDER_SITE_OTHER): Payer: Commercial Managed Care - HMO | Admitting: Physician Assistant

## 2016-09-01 ENCOUNTER — Encounter: Payer: Self-pay | Admitting: Physician Assistant

## 2016-09-01 VITALS — BP 120/55 | HR 46 | Ht 66.0 in | Wt 161.6 lb

## 2016-09-01 DIAGNOSIS — I2510 Atherosclerotic heart disease of native coronary artery without angina pectoris: Secondary | ICD-10-CM | POA: Diagnosis not present

## 2016-09-01 DIAGNOSIS — I428 Other cardiomyopathies: Secondary | ICD-10-CM

## 2016-09-01 DIAGNOSIS — I5022 Chronic systolic (congestive) heart failure: Secondary | ICD-10-CM

## 2016-09-01 DIAGNOSIS — I480 Paroxysmal atrial fibrillation: Secondary | ICD-10-CM | POA: Diagnosis not present

## 2016-09-01 LAB — BASIC METABOLIC PANEL
BUN: 35 mg/dL — AB (ref 7–25)
CHLORIDE: 106 mmol/L (ref 98–110)
CO2: 26 mmol/L (ref 20–31)
Calcium: 8.6 mg/dL (ref 8.6–10.3)
Creat: 1.57 mg/dL — ABNORMAL HIGH (ref 0.70–1.11)
GLUCOSE: 68 mg/dL (ref 65–99)
POTASSIUM: 4.7 mmol/L (ref 3.5–5.3)
Sodium: 138 mmol/L (ref 135–146)

## 2016-09-01 NOTE — Patient Instructions (Signed)
PLEASE BMP -LABS TODAY   CONTINUE LASIX ( FUROSEMIDE ) , YOU MAY TAKE AN EXTERA TABLET DAILY IF NEEDED   Your physician recommends that you schedule a follow-up appointment in: 3 MONTHS WITH DR Martinique.

## 2016-09-01 NOTE — Progress Notes (Signed)
Cardiology Office Note    Date:  09/01/2016   ID:  Darrell Mendoza, DOB 03/15/1925, MRN [id number]  PCP:  Haywood Pao, MD  Cardiologist:  Dr. Martinique   Chief Complaint  Patient presents with  . Follow-up    4 weeks;   . Edema    in legs  . Leg Pain    during the mornings.    History of Present Illness:  Darrell Mendoza is a 82 y.o. male  with PMH of CAD, chronic afib, LV dysfunction/ICM with baseline EF 30-35%. He has a history of coronary artery disease and underwent stenting in mid RCA in 2004 with 3.0 x 23 mm Cypher stent. Prior stress test in December 2010 showed basal to mid inferior wall scar without ischemia and ejection fraction of 61%. He also had a history of DVT with PE on chronic Coumadin therapy. He was found to be in new atrial fibrillation in early 2017 although he was asymptomatic. Echo obtained in March 2017 however showed ejection fraction is down to 30-35%, he subsequently underwent Myoview study that showed inferior scar without ischemia. He was started on low-dose lisinopril 5 mg daily point however did not tolerate this due to hyperkalemia and renal insufficiency.  He was admitted in May 2017 with acute respiratory failure and hypoxia. He also had a fall with vertebral fracture. CT of the head showed finding of a mass consistent with large meningioma. He also had abdominal x-ray and his CT showed a 3.5 cm mass in the right upper lobe. He has since had a cranial MRI and PET scan. MRI of the brain obtained on 7/23 showed heterogeneous 5.8 x 2.3 x 4.1 cm mass spans from the anterior lateral right mid cranial foci superiorly along the right pterion, there is also to ride 2 x 2.1 cm anterior compressed right temporal lobe cyst and vasogenic edema. The appearance is most consistent with partially calcified meningioma with adjacent right temporal lobe cyst changes and the vasogenic edema. PET scan obtained on 03/01/2016 showed hypermetabolic right upper lobe lung mass  suspicious for primary bronchogenic carcinoma, mediastinal and right hilar mass hypermetabolic nodes suspicious for metastatic disease. L1 hypermetabolism corresponding to mild compression deformity which could be the result of osteopenia versus underlying osseous metastasis.  He was seen in cardiology office on 03/11/2016, at which time he was doing well, his heart rate was rate controlled on no rate slowing medication. 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 He was seen by Dr Melvyn Novas on 03/31/2016 who did not believe he was a surgical candidate. Patient and the family decided to proceed with conservative follow-up with repeat scan 3 months afterward. Repeat CT scan of the chest obtained on 06/02/2016 showed interval growth of peripheral RUL mass, interval growth of right hilar lymphadenopathy and possible interval worsening of basilar predominant fibrotic interstitial lung disease. Patient was seen by Dr. Melvyn Novas again on 06/05/2016 who decided to refer the patient to Dr. Osborne Casco on 06/24/2016 for possible biopsy, it was decided he is not a candidate for biopsy either. He has been seen by Dr. Lisbeth Renshaw with radiation oncology on 07/12/2016, patient was not interested in chemotherapy. He is also not interested in biopsy.   He was evaluated on 07/28/2016 for fluid accumulation. He was placed on 20 mg daily of Lasix. I saw the patient on 08/04/2016, he had to stop taking his Lasix, he was still having 1+ pitting edema on physical exam, therefore I have restarted 20 mg  daily of Lasix. He has agreed to radiation therapy for lung cancer at the time. I did start a daily dose of Lasix. A one-week basic metabolic panel showed creatinine trended up slightly, however still within his normal limits. He presents today for cardiology visit, his lower extremity edema has completely resolved. I will obtain a repeat basic metabolic panel. He denies any chest discomfort or shortness of breath.  Initially his pulse rate was 46, however on EKG, he does have frequent PVCs which likely responsible for his low pulse rate, otherwise his heart rate was 90. He just completed a course of radiation therapy 3 days ago, unfortunately his daughter passed away a week ago from severe pneumonia, however since he was on scheduled radiation therapy he could not go see her.    Past Medical History:  Diagnosis Date  . Atrial fibrillation ([place]) 12/11/2015  . Benign prostatic hypertrophy   . Bilateral cataracts   . CKD (chronic kidney disease) stage 3, GFR 30-59 ml/min   . Coronary artery disease    s/p 3.0x23 cypher mid rca 2004  . Deep vein thrombosis ([facility])    Coumadin anticoagulation for deep vein thrombosis  . Greenfield filter in place    History of chronic lower extremity clots  . Hand arthritis   . Hyperlipidemia   . Hypertension     History of hypertension  . Hypotension   . Lightheadedness   . 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 Premature ventricular contractions   . Pulmonary embolism ([place])    05/2011    Past Surgical History:  Procedure Laterality Date  . CARDIAC CATHETERIZATION  10/22/2002   left heart cath  . EXCISON TOENAIL Right    HALLUX  . HIP SURGERY  2001-2001   right hip surgery  . ROTATOR CUFF REPAIR  1985   right rotator cuff surgery  . [place]   for a hernia    Current Medications: Outpatient Medications Prior to Visit  Medication Sig Dispense Refill  . calcium-vitamin D (OSCAL WITH D) 250-125 MG-UNIT per tablet Take 1 tablet by mouth daily.    . carvedilol (COREG) 3.125 MG tablet Take 1 tablet (3.125 mg total) by mouth 2 (two) times daily. 180 tablet 3  . finasteride (PROSCAR) 5 MG tablet Take 5 mg by mouth daily.    . fish oil-omega-3 fatty acids 1000 MG capsule Take 2 g by mouth daily.    . furosemide (LASIX) 20 MG tablet Take 1 tablet by mouth daily.    Marland Kitchen HYDROcodone-acetaminophen (NORCO/VICODIN) 5-325 MG tablet Take 1 tablet by  mouth every 6 (six) hours as needed.    . Multiple Vitamin (MULTIVITAMIN) tablet Take 1 tablet by mouth daily.    . pravastatin (PRAVACHOL) 40 MG tablet Take 40 mg by mouth daily.    . Tamsulosin HCl (FLOMAX) 0.4 MG CAPS Take 0.4 mg by mouth daily.    . vitamin D, CHOLECALCIFEROL, 400 UNITS tablet Take 400 Units by mouth daily.    Marland Kitchen warfarin (COUMADIN) 5 MG tablet Take 0.5-1 tablets (2.5-5 mg total) by mouth daily. Take 2.5 mg Sun / Tues / Wed / Thurs / Sat Take 5 mg on Mon / Fri 30 tablet 0  . Wound Dressings (SONAFINE) Apply 1 application topically daily.     No facility-administered medications prior to visit.      Allergies:   Lisinopril   Social History   Social History  . Marital status: Married  Spouse name: N/A  . Number of children: 2  . Years of education: N/A   Occupational History  . factory  Retired    retired   Social History Main Topics  . Smoking status: Former Smoker    Packs/day: 1.00    Years: 30.00    Types: Cigarettes    Quit date: 10/12/1967  . Smokeless tobacco: Former Systems developer    Quit date: 10/20/1983  . Alcohol use 0.0 oz/week     Comment: very little  . Drug use: No  . Sexual activity: Not Asked   Other Topics Concern  . None   Social History Narrative  . None     Family History:  The patient's family history includes Tuberculosis in his mother.   ROS:   Please see the history of present illness.    ROS All other systems reviewed and are negative.   PHYSICAL EXAM:   VS:  BP (!) 120/55   Pulse (!) 46   Ht '5\' 6"'$  (1.676 m)   Wt 161 lb 9.6 oz (73.3 kg)   BMI 26.08 kg/m    GEN: Well nourished, well developed, in no acute distress  HEENT: normal  Neck: no JVD, carotid bruits, or masses Cardiac: Irregularly irregular; no murmurs, rubs, or gallops,no edema  Respiratory:  clear to auscultation bilaterally, normal work of breathing GI: soft, nontender, nondistended, + BS MS: no deformity or atrophy  Skin: warm and dry, no rash Neuro:   Alert and Oriented x 3, Strength and sensation are intact Psych: euthymic mood, full affect  Wt Readings from Last 3 Encounters:  09/01/16 161 lb 9.6 oz (73.3 kg)  08/27/16 154 lb 12.8 oz (70.2 kg)  08/18/16 158 lb 6.4 oz (71.8 kg)      Studies/Labs Reviewed:   EKG:  EKG is ordered today.  The ekg ordered today demonstrates Atrial fibrillation with frequent PVCs, heart rate 90 bpm.  Recent Labs: 12/29/2015: Brain Natriuretic Peptide 412.9 02/13/2016: ALT 18; Hemoglobin 12.9; Platelets 91 09/01/2016: BUN 35; Creat 1.57; Potassium 4.7; Sodium 138   Lipid Panel No results found for: CHOL, TRIG, HDL, CHOLHDL, VLDL, LDLCALC, LDLDIRECT  Additional studies/ records that were reviewed today include:    Echo 12/15/2015 LV EF: 30% - 35%  ------------------------------------------------------------------- Indications: (I48.91).  ------------------------------------------------------------------- History: PMH: Acquired from the patient and from the patient&'s chart. Atrial fibrillation. Coronary artery disease. Risk factors: Hypertension. Dyslipidemia.  ------------------------------------------------------------------- Study Conclusions  - Left ventricle: The cavity size was normal. There was mild concentric hypertrophy. Systolic function was moderately to severely reduced. The estimated ejection fraction was in the range of 30% to 35%. Diffuse hypokinesis. - Aortic valve: Valve mobility was mildly restricted. There was very mild stenosis. There was mild regurgitation. Peak velocity (S): 216.21 cm/s. Mean gradient (S): 10 mm Hg. Valve area (VTI): 1.84 cm^2. Valve area (Vmax): 1.98 cm^2. Valve area (Vmean): 1.83 cm^2. - Mitral valve: Transvalvular velocity was within the normal range. There was no evidence for stenosis. There was mild to moderate regurgitation. - Left atrium: The atrium was severely dilated. - Right ventricle: The cavity size was  normal. Wall thickness was normal. Systolic function was normal. - Tricuspid valve: There was moderate regurgitation. - Pulmonic valve: There was trivial regurgitation. - Pulmonary arteries: Systolic pressure was moderately increased. PA peak pressure: 47 mm Hg (S). - Inferior vena cava: The vessel was normal in size. The respirophasic diameter changes were in the normal range (>= 50%), consistent with normal central venous pressure.  Myoview 12/24/2015 Study Highlights    There was no ST segment deviation noted during stress.  Defect 1: There is a medium defect of severe severity present in the basal inferoseptal, basal inferior, mid inferoseptal and mid inferior location.  Findings consistent with prior myocardial infarction with very mild peri-infarct ischemia in the inferior wall.  This is a low risk study.      MRI of brain 03/02/2016 IMPRESSION: Extra-axial heterogeneous 5.8 x 2.3 x 4.1 cm mass spans from the anterior lateral right middle cranial fossa superiorly along the right pterion. This abuts the right middle cerebral artery. Compression of the adjacent right frontal and temporal lobe. Within the anterior compressed right temporal lobe there is a 2 x 2 x 2.1 cm cyst and vasogenic edema.  The appearance is most consistent with a partially calcified meningioma with adjacent right temporal lobe cystic changes and vasogenic edema. If the patient's lung mass proves to be malignant than dural metastatic disease cannot be entirely excluded although felt to be a secondary less likely consideration based on MR imaging. Please no present exam was performed without contrast secondary to renal insufficiency.  Remote small cerebellar infarcts greater on the left.  Moderate chronic microvascular changes.  Global atrophy without hydrocephalus.  Diminutive size left vertebral artery.  C3-4 cervical spondylotic changes with slight cord  flattening.    PET scan 03/02/2016 IMPRESSION: 1. Hypermetabolic right upper lobe lung mass, positioned within an underlying bulla. Although this could represent a mycetoma,it is suspicious for a primary bronchogenic carcinoma. 2. Mediastinal and right hilar hypermetabolic nodes, suspicious for metastatic disease. 3. No extrathoracic soft tissue metastasis identified. 4. L1 hypermetabolism corresponding to a mild compression deformity. This could be due to osteopenia or an underlying osseous metastasis. More vague T10 and T11 hypermetabolism is indeterminate. If the patient is diagnosed with primary bronchogenic carcinoma, pre and postcontrast thoracolumbar spine MRI may be informative. 5. Incidental findings, including hiatal hernia, cholelithiasis, and advanced atherosclerosis.    CT of chest wo contrast 06/02/2016 IMPRESSION: 1. Interval growth of peripheral right upper lobe lung mass, now 4.8 cm, most consistent with primary bronchogenic carcinoma. 2. Interval growth of right hilar lymphadenopathy, suspicious for nodal metastasis. Mild right paratracheal and subcarinal mediastinal lymphadenopathy appears stable, possibly representing nodal metastases. 3. Spectrum of finding suggestive of basilar predominant fibrotic interstitial lung disease, with possible interval worsening, worrisome for usual interstitial pneumonia (UIP). 4. Moderate L1 vertebral compression fracture, with interval loss of L1 vertebral body height. 5. Additional findings include aortic atherosclerosis, mild cardiomegaly, stable dilated main pulmonary artery suggesting chronic pulmonary hypertension, left main and 3 vessel coronary atherosclerosis, moderate to severe emphysema, cholelithiasis and moderate hiatal hernia.   ASSESSMENT:    1. Chronic systolic heart failure ([place])   2. Nonischemic cardiomyopathy ([facility])   3. Paroxysmal atrial fibrillation ([place])   4. Coronary artery disease involving  native coronary artery of native heart without angina pectoris      PLAN:  In order of problems listed above:  1. Chronic systolic heart failure: Euvolemic on physical exam. Will continue 20 mg daily of Lasix. We'll check a basic metabolic panel today. He can take extra dose of Lasix daily if has increased lower extremity edema.  2. Hypertension: Blood pressure stable.  3. Persistent atrial fibrillation: On Coumadin, INR managed by PCPs office. On April 20 lisinopril due to hyperkalemia and worsening renal function.  4. CAD:  With PCI of RCA in 2004. Given recent LV dysfunction noted on echocardiogram, he did undergo  stress test on 12/24/2015 which showed no significant ischemia, overall low risk study.    Medication Adjustments/Labs and Tests Ordered: Current medicines are reviewed at length with the patient today.  Concerns regarding medicines are outlined above.  Medication changes, Labs and Tests ordered today are listed in the Patient Instructions below. Patient Instructions   PLEASE BMP -LABS TODAY   CONTINUE LASIX ( FUROSEMIDE ) , YOU MAY TAKE AN EXTERA TABLET DAILY IF NEEDED   Your physician recommends that you schedule a follow-up appointment in: 3 MONTHS WITH DR Martinique.     Hilbert Corrigan, [place]  09/01/2016 11:12 PM    [facility] [address], [place], [place]  [address] Phone: [phone]; Fax: [phone]

## 2016-09-06 ENCOUNTER — Telehealth: Payer: Self-pay | Admitting: *Deleted

## 2016-09-06 DIAGNOSIS — Z7901 Long term (current) use of anticoagulants: Secondary | ICD-10-CM | POA: Diagnosis not present

## 2016-09-06 DIAGNOSIS — I4891 Unspecified atrial fibrillation: Secondary | ICD-10-CM | POA: Diagnosis not present

## 2016-09-06 DIAGNOSIS — Z86711 Personal history of pulmonary embolism: Secondary | ICD-10-CM | POA: Diagnosis not present

## 2016-09-06 NOTE — Telephone Encounter (Signed)
Wife called patient having difficulty swallowing foods,  Returned call and patient is eating  soft foods and swallowing liquids okay, sometimes feels like it hurts going down, stated it will be that way for a few more weeks due to the radiation to his chest/esophagus, , she stated she didn't think he needed anything for this, but thanked RN for returning call so soon 11:20 AM

## 2016-09-07 ENCOUNTER — Ambulatory Visit: Payer: Commercial Managed Care - HMO | Admitting: Radiation Oncology

## 2016-09-14 ENCOUNTER — Ambulatory Visit: Payer: Commercial Managed Care - HMO | Admitting: Cardiology

## 2016-09-19 NOTE — Progress Notes (Signed)
  Radiation Oncology         [phone] ________________________________  Name: Darrell Mendoza MRN: [id number]  Date: 08/29/2016  DOB: 1925-01-31  End of Treatment Note  Diagnosis:   Lung cancer     Indication for treatment::  palliative       Radiation treatment dates:   08/17/2016 through 08/29/2016  Site/dose:   The patient was treated to the right lung to a total dose of 30 gray in 10 fractions at 3 gray per fraction using a 3 field technique  Narrative: The patient tolerated radiation treatment relatively well.     Plan: The patient has completed radiation treatment. The patient will return to radiation oncology clinic for routine followup in one month. I advised the patient to call or return sooner if they have any questions or concerns related to their recovery or treatment. ________________________________  Jodelle Gross, M.D., Ph.D.

## 2016-09-30 DIAGNOSIS — E7800 Pure hypercholesterolemia, unspecified: Secondary | ICD-10-CM | POA: Diagnosis not present

## 2016-09-30 DIAGNOSIS — Z9861 Coronary angioplasty status: Secondary | ICD-10-CM | POA: Diagnosis not present

## 2016-09-30 DIAGNOSIS — D320 Benign neoplasm of cerebral meninges: Secondary | ICD-10-CM | POA: Diagnosis not present

## 2016-09-30 DIAGNOSIS — I4891 Unspecified atrial fibrillation: Secondary | ICD-10-CM | POA: Diagnosis not present

## 2016-09-30 DIAGNOSIS — I509 Heart failure, unspecified: Secondary | ICD-10-CM | POA: Diagnosis not present

## 2016-09-30 DIAGNOSIS — Z6823 Body mass index (BMI) 23.0-23.9, adult: Secondary | ICD-10-CM | POA: Diagnosis not present

## 2016-09-30 DIAGNOSIS — R634 Abnormal weight loss: Secondary | ICD-10-CM | POA: Diagnosis not present

## 2016-09-30 DIAGNOSIS — Z7901 Long term (current) use of anticoagulants: Secondary | ICD-10-CM | POA: Diagnosis not present

## 2016-09-30 DIAGNOSIS — I10 Essential (primary) hypertension: Secondary | ICD-10-CM | POA: Diagnosis not present

## 2016-10-12 NOTE — Progress Notes (Addendum)
Mr. Labrandon Knoch 83 y.o. man is here for a one month follow up appointment for right lung cancer.  Weight changes, if any: Wt Readings from Last 3 Encounters:  10/19/16 155 lb 3.2 oz (70.4 kg)  09/01/16 161 lb 9.6 oz (73.3 kg)  08/27/16 154 lb 12.8 oz (70.2 kg)   Respiratory complaints, if any: Occasional cough clear sercretion Hemoptysis, if any: No Skin changes:  Skin to right chest with normal color used lotion with vitamin E. Swallowing Problems/Pain/Difficulty swallowing:Problems swallowing medications  get stuck, okay eating. Smoking Tobacco/Marijuana/Snuff/ETOH use: Former smoker cigarettes x 30 years 1 p/d quit 10-12-67 alcohol usage no drug usuage Appetite :Good Pain: None Fatigue:Mild fatigue in the afternoon takes a nap as needed. When is next chemo scheduled?:None Lab work from of chart:None BP 120/75   Pulse 60   Temp 98.3 F (36.8 C) (Oral)   Resp 18   Ht '5\' 6"'$  (1.676 m)   Wt 155 lb 3.2 oz (70.4 kg)   SpO2 95%   BMI 25.05 kg/m

## 2016-10-19 ENCOUNTER — Ambulatory Visit
Admission: RE | Admit: 2016-10-19 | Discharge: 2016-10-19 | Disposition: A | Discharge status: Home / Self Care | Payer: Commercial Managed Care - HMO | Source: Ambulatory Visit | Attending: Radiation Oncology | Admitting: Radiation Oncology

## 2016-10-19 ENCOUNTER — Encounter: Payer: Self-pay | Admitting: Radiation Oncology

## 2016-10-19 VITALS — BP 120/75 | HR 60 | Temp 98.3°F | Resp 18 | Ht 66.0 in | Wt 155.2 lb

## 2016-10-19 DIAGNOSIS — Z923 Personal history of irradiation: Secondary | ICD-10-CM | POA: Insufficient documentation

## 2016-10-19 DIAGNOSIS — Z7901 Long term (current) use of anticoagulants: Secondary | ICD-10-CM | POA: Insufficient documentation

## 2016-10-19 DIAGNOSIS — C3411 Malignant neoplasm of upper lobe, right bronchus or lung: Secondary | ICD-10-CM | POA: Diagnosis not present

## 2016-10-19 DIAGNOSIS — Z888 Allergy status to other drugs, medicaments and biological substances status: Secondary | ICD-10-CM | POA: Diagnosis not present

## 2016-10-19 NOTE — Addendum Note (Signed)
Encounter addended by: Malena Edman, RN on: 10/19/2016  3:54 PM<BR>    Actions taken: Charge Capture section accepted

## 2016-10-19 NOTE — Progress Notes (Signed)
Radiation Oncology         [phone] ________________________________  Name: Darrell Mendoza MRN: [id number]  Date: 10/19/2016  DOB: September 01, 1925  Post Treatment Note  CC: Haywood Pao, MD  Tisovec, Fransico Him, MD  Diagnosis:   Putative Stage III, right upper lobe lung cancer  Interval Since Last Radiation:  7 weeks   08/17/2016 through 08/29/2016: The patient was treated to the right lung to a total dose of 30 gray in 10 fractions at 3 gray per fraction using a 3 field technique  Narrative: The patient tolerated palliative radiotherapy well. Again in summary this is a 83 y.o. Male who was found to have imaging studies consistent with stage III lung cancer. He was not candidate for operation or for obtaining a tissue diagnosis, after weighing the risks and benefits, the patient elected to move forward with radiotherapy despite this. He completed his palliative radiotherapy as above, and comes today for posttreatment evaluation.                             On review of systems, the patient states he's doing well. He continues to have clear mucous production with occasional cough. He denies fever or chills, shortness of breath, fevers, or chest pain. No other complaints are verbalized.   ALLERGIES:  is allergic to lisinopril.  Meds: Current Outpatient Prescriptions  Medication Sig Dispense Refill  . calcium-vitamin D (OSCAL WITH D) 250-125 MG-UNIT per tablet Take 1 tablet by mouth daily.    . carvedilol (COREG) 3.125 MG tablet Take 1 tablet (3.125 mg total) by mouth 2 (two) times daily. 180 tablet 3  . finasteride (PROSCAR) 5 MG tablet Take 5 mg by mouth daily.    . fish oil-omega-3 fatty acids 1000 MG capsule Take 2 g by mouth daily.    . furosemide (LASIX) 20 MG tablet Take 1 tablet by mouth daily.    . Multiple Vitamin (MULTIVITAMIN) tablet Take 1 tablet by mouth daily.    . pravastatin (PRAVACHOL) 40 MG tablet Take 40 mg by mouth daily.    . Tamsulosin HCl (FLOMAX) 0.4 MG CAPS  Take 0.4 mg by mouth daily.    . vitamin D, CHOLECALCIFEROL, 400 UNITS tablet Take 400 Units by mouth daily.    Marland Kitchen warfarin (COUMADIN) 5 MG tablet Take 0.5-1 tablets (2.5-5 mg total) by mouth daily. Take 2.5 mg Sun / Tues / Wed / Thurs / Sat Take 5 mg on Mon / Fri 30 tablet 0  . HYDROcodone-acetaminophen (NORCO/VICODIN) 5-325 MG tablet Take 1 tablet by mouth every 6 (six) hours as needed.     No current facility-administered medications for this encounter.     Physical Findings:  height is '5\' 6"'$  (1.676 m) and weight is 155 lb 3.2 oz (70.4 kg). His oral temperature is 98.3 F (36.8 C). His blood pressure is 120/75 and his pulse is 60. His respiration is 18 and oxygen saturation is 95%.  In general this is a well appearing Caucasian male in no acute distress. He's alert and oriented x4 and appropriate throughout the examination. Chest is clear to auscultation bilaterally, and cardiovascular is negative for C/R/M and reveals a regular rate and rhythm.  Lab Findings: Lab Results  Component Value Date   WBC 3.1 (L) 02/13/2016   HGB 12.9 (L) 02/13/2016   HCT 39.6 02/13/2016   MCV 94.5 02/13/2016   PLT 91 (L) 02/13/2016     Radiographic Findings: No  results found.  Impression/Plan: 1. Putative Stage III, right upper lobe lung cancer. The patient has done well since completing his palliative radiotherapy. Again he has declined any additional systemic therapy, and does not have follow-up medical oncology, however I will order a baseline CT scan, and follow up with the results by phone. I will also contact his primary care provider Dr. Osborne Casco to see how he would like [place] to participate in this patient's care. We also discussed the utility in having palliative care available if the patient desired. He will consider this and I will also let Dr. Osborne Casco know of our conversation regarding this.       Carola Rhine, PAC

## 2016-10-21 ENCOUNTER — Telehealth: Payer: Self-pay | Admitting: *Deleted

## 2016-10-21 NOTE — Telephone Encounter (Signed)
CALLED PATIENT TO INFORM OF CT ON 10-29-16 - ARRIVAL TIME - 2:45 PM @ [facility], NO ANSWER, MAILED APPT. CARD

## 2016-10-29 ENCOUNTER — Ambulatory Visit (HOSPITAL_COMMUNITY)
Admission: RE | Admit: 2016-10-29 | Discharge: 2016-10-29 | Disposition: A | Discharge status: Home / Self Care | Payer: Medicare HMO | Source: Ambulatory Visit | Attending: Radiation Oncology | Admitting: Radiation Oncology

## 2016-10-29 DIAGNOSIS — R590 Localized enlarged lymph nodes: Secondary | ICD-10-CM | POA: Diagnosis not present

## 2016-10-29 DIAGNOSIS — Z923 Personal history of irradiation: Secondary | ICD-10-CM | POA: Insufficient documentation

## 2016-10-29 DIAGNOSIS — I2510 Atherosclerotic heart disease of native coronary artery without angina pectoris: Secondary | ICD-10-CM | POA: Insufficient documentation

## 2016-10-29 DIAGNOSIS — J439 Emphysema, unspecified: Secondary | ICD-10-CM | POA: Diagnosis not present

## 2016-10-29 DIAGNOSIS — I700 Atherosclerosis of aorta: Secondary | ICD-10-CM | POA: Diagnosis not present

## 2016-10-29 DIAGNOSIS — R911 Solitary pulmonary nodule: Secondary | ICD-10-CM | POA: Diagnosis not present

## 2016-10-29 DIAGNOSIS — K8020 Calculus of gallbladder without cholecystitis without obstruction: Secondary | ICD-10-CM | POA: Insufficient documentation

## 2016-10-29 DIAGNOSIS — I517 Cardiomegaly: Secondary | ICD-10-CM | POA: Insufficient documentation

## 2016-10-29 DIAGNOSIS — K449 Diaphragmatic hernia without obstruction or gangrene: Secondary | ICD-10-CM | POA: Diagnosis not present

## 2016-10-29 DIAGNOSIS — M4856XA Collapsed vertebra, not elsewhere classified, lumbar region, initial encounter for fracture: Secondary | ICD-10-CM | POA: Insufficient documentation

## 2016-10-29 DIAGNOSIS — C3411 Malignant neoplasm of upper lobe, right bronchus or lung: Secondary | ICD-10-CM | POA: Insufficient documentation

## 2016-11-01 ENCOUNTER — Other Ambulatory Visit: Payer: Self-pay | Admitting: Cardiology

## 2016-11-01 DIAGNOSIS — I428 Other cardiomyopathies: Secondary | ICD-10-CM

## 2016-11-04 ENCOUNTER — Telehealth: Payer: Self-pay | Admitting: Radiation Oncology

## 2016-11-04 DIAGNOSIS — Z7901 Long term (current) use of anticoagulants: Secondary | ICD-10-CM | POA: Diagnosis not present

## 2016-11-04 DIAGNOSIS — I4891 Unspecified atrial fibrillation: Secondary | ICD-10-CM | POA: Diagnosis not present

## 2016-11-04 NOTE — Telephone Encounter (Signed)
I spoke with the patient's wife about the discussion I had with his internal medicine physician, and he will follow up with Dr. Osborne Casco for follow up of his other comorbidites, and with prn imaging based on symptomatology, and we will see him as needed moving forward as well. The patient's wife was counseld on this as well as the imaging findings from his recent CT.

## 2016-12-02 NOTE — Progress Notes (Signed)
Cardiology Office Note    Date:  12/03/2016   ID:  Darrell Mendoza, DOB 09-May-1925, MRN [id number]  PCP:  Haywood Pao, MD  Cardiologist:  Dr. Martinique   Chief Complaint  Patient presents with  . Follow-up    3 MONTHS  . Shortness of Breath  . Headache    Right ankle.  . Atrial Fibrillation    History of Present Illness:  Darrell Mendoza is a 83 y.o. male  with PMH of CAD, chronic afib, LV dysfunction/ICM with baseline EF 30-35%. He has a history of coronary artery disease and underwent stenting in mid RCA in 2004 with 3.0 x 23 mm Cypher stent. Prior stress test in December 2010 showed basal to mid inferior wall scar without ischemia and ejection fraction of 61%. He also had a history of DVT with PE on chronic Coumadin therapy. He was found to be in new atrial fibrillation in early 2017 although he was asymptomatic. Echo obtained in March 2017 however showed ejection fraction is down to 30-35%, he subsequently underwent Myoview study that showed inferior scar without ischemia. He was started on low-dose lisinopril 5 mg daily point however did not tolerate this due to hyperkalemia and renal insufficiency.  He was admitted in May 2017 with acute respiratory failure and hypoxia. He also had a fall with vertebral fracture. CT of the head showed finding of a mass consistent with large meningioma. He also had abdominal x-ray and his CT showed a 3.5 cm mass in the right upper lobe. He has since had a cranial MRI and PET scan. MRI of the brain obtained on 7/23 showed heterogeneous 5.8 x 2.3 x 4.1 cm mass spans from the anterior lateral right mid cranial foci superiorly along the right pterion, there is also to ride 2 x 2.1 cm anterior compressed right temporal lobe cyst and vasogenic edema. The appearance is most consistent with partially calcified meningioma with adjacent right temporal lobe cyst changes and the vasogenic edema. PET scan obtained on 03/01/2016 showed hypermetabolic right upper lobe  lung mass suspicious for primary bronchogenic carcinoma, mediastinal and right hilar mass hypermetabolic nodes suspicious for metastatic disease. L1 hypermetabolism corresponding to mild compression deformity which could be the result of osteopenia versus underlying osseous metastasis.  He was seen in cardiology office on 03/11/2016, at which time he was doing well, his heart rate was rate controlled on no rate slowing medication. 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 He was seen by Dr Melvyn Novas on 03/31/2016 who did not believe he was a surgical candidate. Patient and the family decided to proceed with conservative follow-up with repeat scan 3 months afterward. Repeat CT scan of the chest obtained on 06/02/2016 showed interval growth of peripheral RUL mass, interval growth of right hilar lymphadenopathy and possible interval worsening of basilar predominant fibrotic interstitial lung disease. Patient was seen by Dr. Melvyn Novas again on 06/05/2016 who decided to refer the patient to Dr. Osborne Casco on 06/24/2016 for possible biopsy, it was decided he is not a candidate for biopsy either. He has been seen by Dr. Lisbeth Renshaw with radiation oncology on 07/12/2016, patient was not interested in chemotherapy. He is also not interested in biopsy. He was subsequently treated with RT. He has had some edema managed with lasix. Unfortunately his daughter passed away from severe pneumonia, however since he was on scheduled radiation therapy he could not go see her.  On follow up today he states he completed XRT one month ago. Is  feeling much better now. Less fatigue. Appetite improved. No chest pain, SOB, cough, edema. Weight stable. No dizziness or palpitations.    Past Medical History:  Diagnosis Date  . Atrial fibrillation ([place]) 12/11/2015  . Benign prostatic hypertrophy   . Bilateral cataracts   . CKD (chronic kidney disease) stage 3, GFR 30-59 ml/min   . Coronary artery disease    s/p  3.0x23 cypher mid rca 2004  . Deep vein thrombosis (H[facility])    Coumadin anticoagulation for deep vein thrombosis  . Greenfield filter in place    History of chronic lower extremity clots  . Hand arthritis   . Hyperlipidemia   . Hypertension     History of hypertension  . Hypotension   . Lightheadedness   . 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 Premature ventricular contractions   . Pulmonary embolism ([place])    05/2011    Past Surgical History:  Procedure Laterality Date  . CARDIAC CATHETERIZATION  10/22/2002   left heart cath  . EXCISON TOENAIL Right    HALLUX  . HIP SURGERY  2001-2001   right hip surgery  . ROTATOR CUFF REPAIR  1985   right rotator cuff surgery  . [place]   for a hernia    Current Medications: Outpatient Medications Prior to Visit  Medication Sig Dispense Refill  . calcium-vitamin D (OSCAL WITH D) 250-125 MG-UNIT per tablet Take 1 tablet by mouth daily.    . carvedilol (COREG) 3.125 MG tablet TAKE 1 TABLET BY MOUTH TWICE A DAY 180 tablet 3  . finasteride (PROSCAR) 5 MG tablet Take 5 mg by mouth daily.    . fish oil-omega-3 fatty acids 1000 MG capsule Take 2 g by mouth daily.    . furosemide (LASIX) 20 MG tablet Take 1 tablet by mouth daily.    Marland Kitchen HYDROcodone-acetaminophen (NORCO/VICODIN) 5-325 MG tablet Take 1 tablet by mouth every 6 (six) hours as needed.    . Multiple Vitamin (MULTIVITAMIN) tablet Take 1 tablet by mouth daily.    . pravastatin (PRAVACHOL) 40 MG tablet Take 40 mg by mouth daily.    . Tamsulosin HCl (FLOMAX) 0.4 MG CAPS Take 0.4 mg by mouth daily.    . vitamin D, CHOLECALCIFEROL, 400 UNITS tablet Take 400 Units by mouth daily.    Marland Kitchen warfarin (COUMADIN) 5 MG tablet Take 0.5-1 tablets (2.5-5 mg total) by mouth daily. Take 2.5 mg Sun / Tues / Wed / Thurs / Sat Take 5 mg on Mon / Fri 30 tablet 0   No facility-administered medications prior to visit.      Allergies:   Lisinopril   Social History   Social History  .  Marital status: Married    Spouse name: N/A  . Number of children: 2  . Years of education: N/A   Occupational History  . factory  Retired    retired   Social History Main Topics  . Smoking status: Former Smoker    Packs/day: 1.00    Years: 30.00    Types: Cigarettes    Quit date: 10/12/1967  . Smokeless tobacco: Former Systems developer    Quit date: 10/20/1983  . Alcohol use 0.0 oz/week     Comment: very little  . Drug use: No  . Sexual activity: Not Asked   Other Topics Concern  . None   Social History Narrative  . None     Family History:  The patient's family history includes Tuberculosis in  his mother.   ROS:   Please see the history of present illness.    ROS All other systems reviewed and are negative.   PHYSICAL EXAM:   VS:  BP 140/70   Pulse 98   Ht '5\' 6"'$  (1.676 m)   Wt 157 lb (71.2 kg)   BMI 25.34 kg/m    GEN: Well nourished, well developed, in no acute distress  HEENT: normal  Neck: no JVD, carotid bruits, or masses Cardiac: Irregularly irregular; no murmurs, rubs, or gallops,no edema  Respiratory:  clear to auscultation bilaterally, normal work of breathing GI: soft, nontender, nondistended, + BS MS: no deformity or atrophy  Skin: warm and dry, no rash Neuro:  Alert and Oriented x 3, Strength and sensation are intact Psych: euthymic mood, full affect  Wt Readings from Last 3 Encounters:  12/03/16 157 lb (71.2 kg)  10/19/16 155 lb 3.2 oz (70.4 kg)  09/01/16 161 lb 9.6 oz (73.3 kg)      Studies/Labs Reviewed:   EKG:  EKG is not ordered today.  The ekg ordered today demonstrates N/A  Recent Labs: 12/29/2015: Brain Natriuretic Peptide 412.9 02/13/2016: ALT 18; Hemoglobin 12.9; Platelets 91 09/01/2016: BUN 35; Creat 1.57; Potassium 4.7; Sodium 138   Lipid Panel No results found for: CHOL, TRIG, HDL, CHOLHDL, VLDL, LDLCALC, LDLDIRECT  Additional studies/ records that were reviewed today include:    Echo 12/15/2015 LV EF: 30% -  35%  ------------------------------------------------------------------- Indications: (I48.91).  ------------------------------------------------------------------- History: PMH: Acquired from the patient and from the patient&'s chart. Atrial fibrillation. Coronary artery disease. Risk factors: Hypertension. Dyslipidemia.  ------------------------------------------------------------------- Study Conclusions  - Left ventricle: The cavity size was normal. There was mild concentric hypertrophy. Systolic function was moderately to severely reduced. The estimated ejection fraction was in the range of 30% to 35%. Diffuse hypokinesis. - Aortic valve: Valve mobility was mildly restricted. There was very mild stenosis. There was mild regurgitation. Peak velocity (S): 216.21 cm/s. Mean gradient (S): 10 mm Hg. Valve area (VTI): 1.84 cm^2. Valve area (Vmax): 1.98 cm^2. Valve area (Vmean): 1.83 cm^2. - Mitral valve: Transvalvular velocity was within the normal range. There was no evidence for stenosis. There was mild to moderate regurgitation. - Left atrium: The atrium was severely dilated. - Right ventricle: The cavity size was normal. Wall thickness was normal. Systolic function was normal. - Tricuspid valve: There was moderate regurgitation. - Pulmonic valve: There was trivial regurgitation. - Pulmonary arteries: Systolic pressure was moderately increased. PA peak pressure: 47 mm Hg (S). - Inferior vena cava: The vessel was normal in size. The respirophasic diameter changes were in the normal range (>= 50%), consistent with normal central venous pressure.   Myoview 12/24/2015 Study Highlights    There was no ST segment deviation noted during stress.  Defect 1: There is a medium defect of severe severity present in the basal inferoseptal, basal inferior, mid inferoseptal and mid inferior location.  Findings consistent with prior  myocardial infarction with very mild peri-infarct ischemia in the inferior wall.  This is a low risk study.      MRI of brain 03/02/2016 IMPRESSION: Extra-axial heterogeneous 5.8 x 2.3 x 4.1 cm mass spans from the anterior lateral right middle cranial fossa superiorly along the right pterion. This abuts the right middle cerebral artery. Compression of the adjacent right frontal and temporal lobe. Within the anterior compressed right temporal lobe there is a 2 x 2 x 2.1 cm cyst and vasogenic edema.  The appearance is most consistent with a  partially calcified meningioma with adjacent right temporal lobe cystic changes and vasogenic edema. If the patient's lung mass proves to be malignant than dural metastatic disease cannot be entirely excluded although felt to be a secondary less likely consideration based on MR imaging. Please no present exam was performed without contrast secondary to renal insufficiency.  Remote small cerebellar infarcts greater on the left.  Moderate chronic microvascular changes.  Global atrophy without hydrocephalus.  Diminutive size left vertebral artery.  C3-4 cervical spondylotic changes with slight cord flattening.    PET scan 03/02/2016 IMPRESSION: 1. Hypermetabolic right upper lobe lung mass, positioned within an underlying bulla. Although this could represent a mycetoma,it is suspicious for a primary bronchogenic carcinoma. 2. Mediastinal and right hilar hypermetabolic nodes, suspicious for metastatic disease. 3. No extrathoracic soft tissue metastasis identified. 4. L1 hypermetabolism corresponding to a mild compression deformity. This could be due to osteopenia or an underlying osseous metastasis. More vague T10 and T11 hypermetabolism is indeterminate. If the patient is diagnosed with primary bronchogenic carcinoma, pre and postcontrast thoracolumbar spine MRI may be informative. 5. Incidental findings, including hiatal  hernia, cholelithiasis, and advanced atherosclerosis.    CT of chest wo contrast 06/02/2016 IMPRESSION: 1. Interval growth of peripheral right upper lobe lung mass, now 4.8 cm, most consistent with primary bronchogenic carcinoma. 2. Interval growth of right hilar lymphadenopathy, suspicious for nodal metastasis. Mild right paratracheal and subcarinal mediastinal lymphadenopathy appears stable, possibly representing nodal metastases. 3. Spectrum of finding suggestive of basilar predominant fibrotic interstitial lung disease, with possible interval worsening, worrisome for usual interstitial pneumonia (UIP). 4. Moderate L1 vertebral compression fracture, with interval loss of L1 vertebral body height. 5. Additional findings include aortic atherosclerosis, mild cardiomegaly, stable dilated main pulmonary artery suggesting chronic pulmonary hypertension, left main and 3 vessel coronary atherosclerosis, moderate to severe emphysema, cholelithiasis and moderate hiatal hernia.   ASSESSMENT:    1. Paroxysmal atrial fibrillation ([facility])   2. Chronic systolic heart failure ([place])   3. Essential hypertension      PLAN:  In order of problems listed above:  1. Chronic systolic heart failure: Euvolemic on physical exam. Will continue 20 mg daily of Lasix.  He can take extra dose of Lasix daily if has increased lower extremity edema.  2. Hypertension: Blood pressure stable.  3. Persistent atrial fibrillation: On Coumadin, INR managed by PCPs office. No ACEi or ARB due to hyperkalemia and CKD  4. CAD:  With PCI of RCA in 2004. Given recent LV dysfunction noted on echocardiogram, he did undergo stress test on 12/24/2015 which showed no significant ischemia, overall low risk study. 5.        Lung CA s/p RT for palliation.  I will follow up in 6 months.    Medication Adjustments/Labs and Tests Ordered: Current medicines are reviewed at length with the patient today.  Concerns regarding  medicines are outlined above.  Medication changes, Labs and Tests ordered today are listed in the Patient Instructions below. There are no Patient Instructions on file for this visit.   Signed, Brant Peets Martin[place], MD  12/03/2016 11:43 AM    [facility]

## 2016-12-03 ENCOUNTER — Encounter: Payer: Self-pay | Admitting: Cardiology

## 2016-12-03 ENCOUNTER — Ambulatory Visit (INDEPENDENT_AMBULATORY_CARE_PROVIDER_SITE_OTHER): Payer: Medicare HMO | Admitting: Cardiology

## 2016-12-03 VITALS — BP 140/70 | HR 98 | Ht 66.0 in | Wt 157.0 lb

## 2016-12-03 DIAGNOSIS — I480 Paroxysmal atrial fibrillation: Secondary | ICD-10-CM | POA: Diagnosis not present

## 2016-12-03 DIAGNOSIS — I10 Essential (primary) hypertension: Secondary | ICD-10-CM | POA: Diagnosis not present

## 2016-12-03 DIAGNOSIS — I5022 Chronic systolic (congestive) heart failure: Secondary | ICD-10-CM

## 2016-12-03 NOTE — Patient Instructions (Signed)
Continue your current therapy  I will see you in 6 months.   

## 2016-12-05 IMAGING — CT CT HEAD W/O CM
2 series · 16 of 30 positions shown, 18 images · non-contrast
Comparison: None.

CLINICAL DATA: 89-year-old who tripped and fell onto his right
side. Patient currently anticoagulated for prior history of
pulmonary embolism.

EXAM:
CT HEAD WITHOUT CONTRAST
TECHNIQUE: Contiguous axial images were obtained from the base of the skull
through the vertex without intravenous contrast.

[Series 201: head w/o, idose (1) · axial · non-contrast · 0.49mm/px · z∈[+294,+419]mm · 8 of 33 slices shown, 10 images]
[im 4/33  brain]
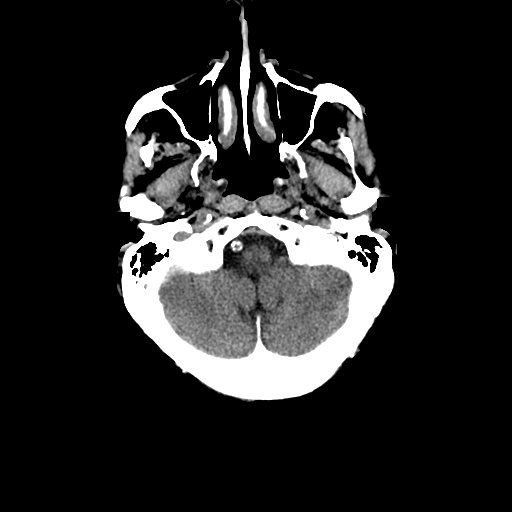
[im 4/33  bone]
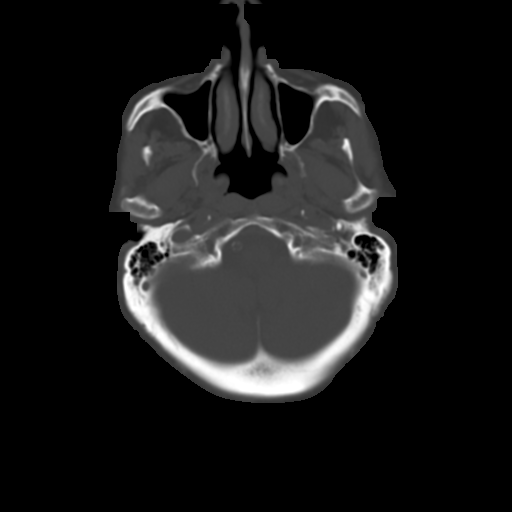
[im 8/33  brain]
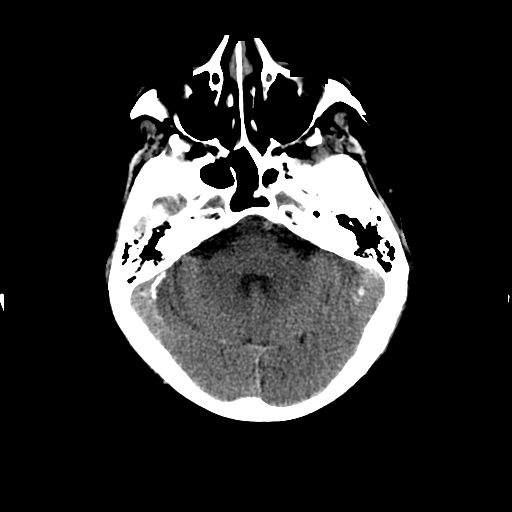
[im 11/33  brain]
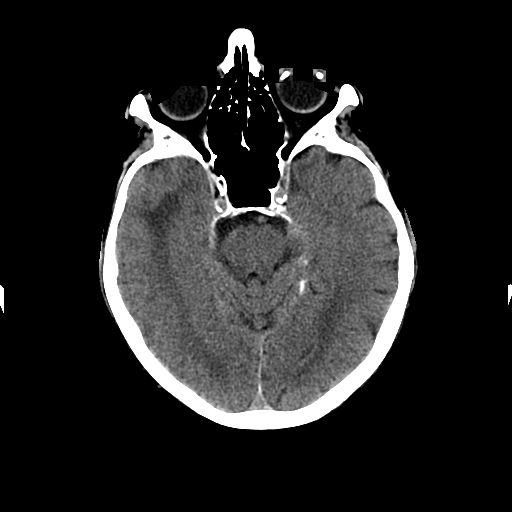
[im 15/33  brain]
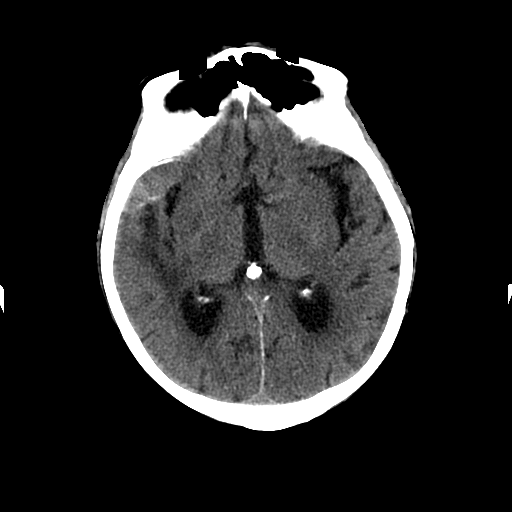
[im 18/33  brain]
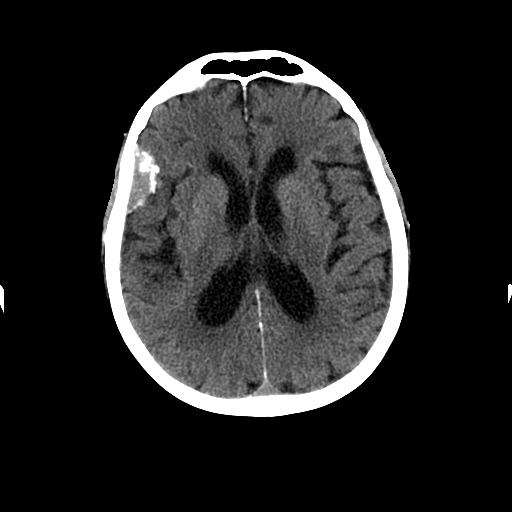
[im 18/33  bone]
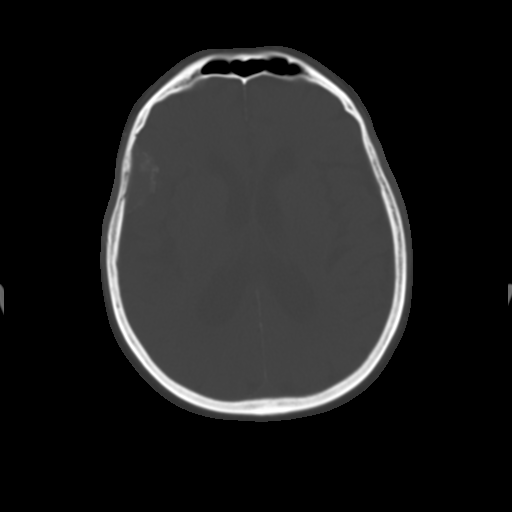
[im 22/33  brain]
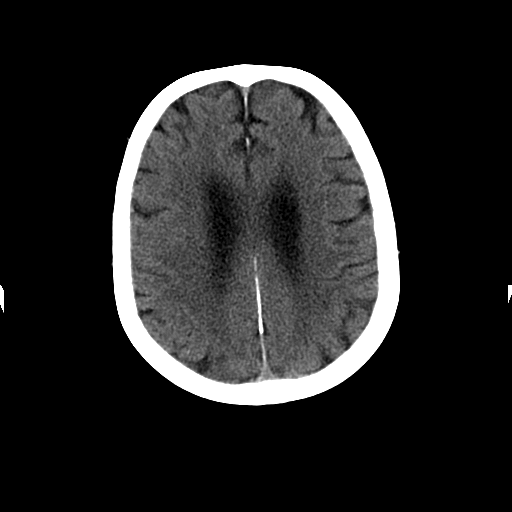
[im 25/33  brain]
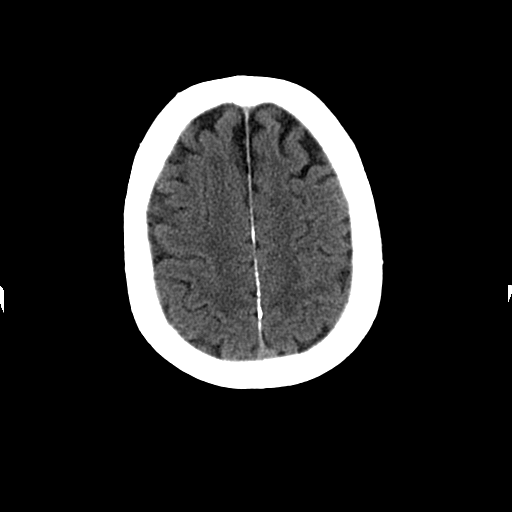
[im 29/33  brain]
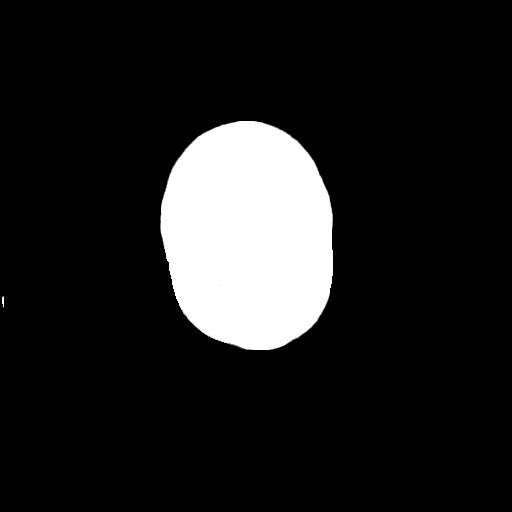

[Series 202: head w/o bone, idose (1) · axial · non-contrast · 0.49mm/px · z∈[+292,+422]mm · 8 of 66 slices shown]
[im 7/66  bone]
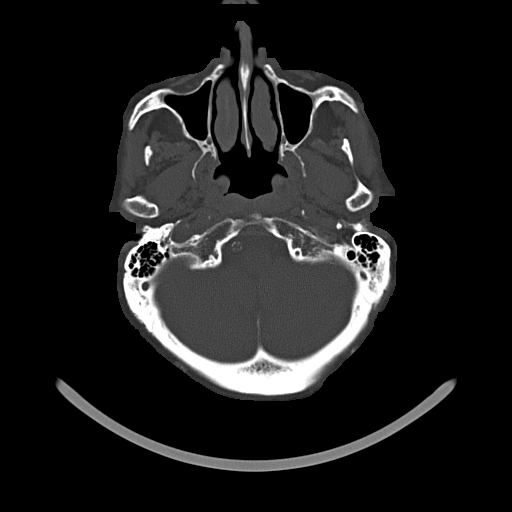
[im 14/66  bone]
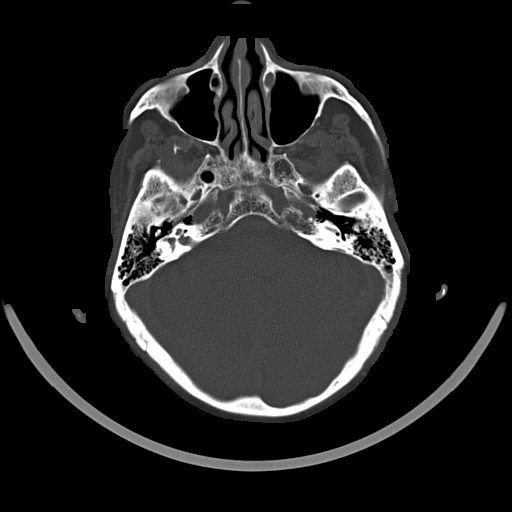
[im 21/66  bone]
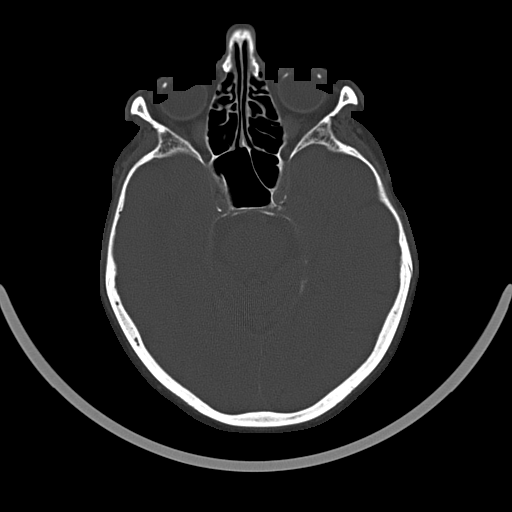
[im 28/66  bone]
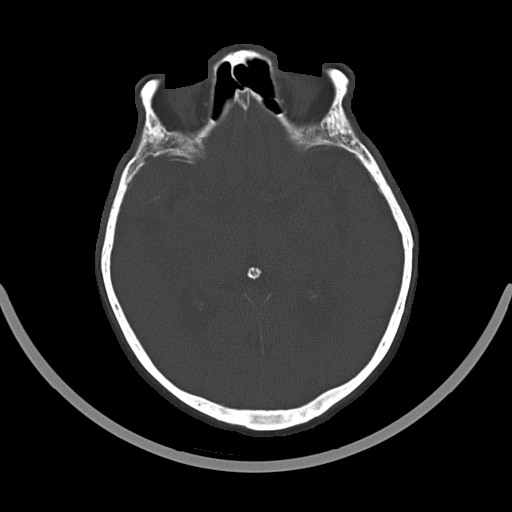
[im 38/66  bone]
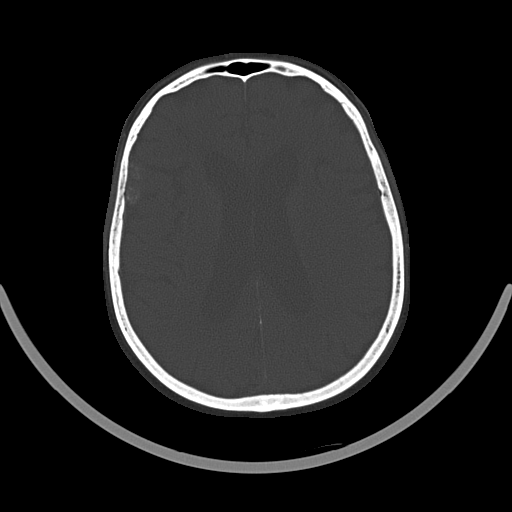
[im 45/66  bone]
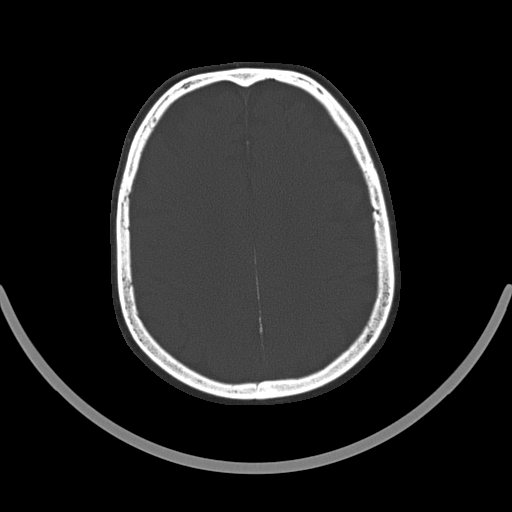
[im 52/66  bone]
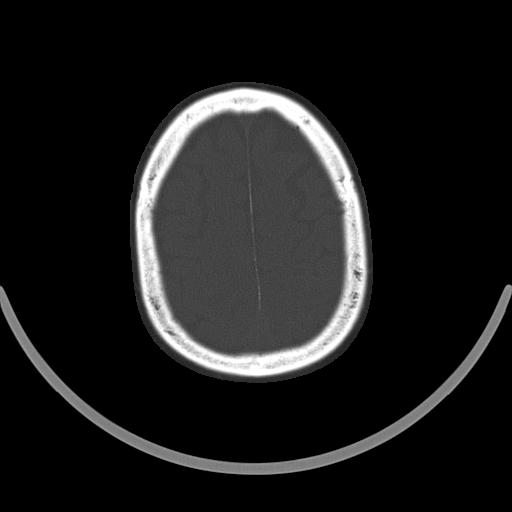
[im 59/66  bone]
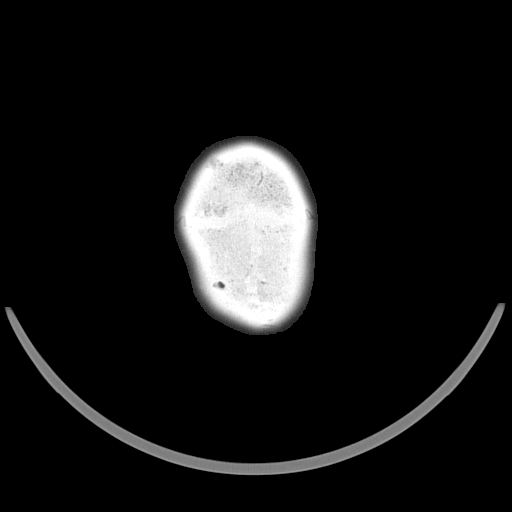

[16 of 30 positions shown; findings below may reference images not displayed]

FINDINGS: Moderate age related cortical and deep atrophy. Mild changes of
small vessel disease of the white matter. Partially calcified likely
extra-axial mass arising from the right temporal bone measuring
maximally approximately 1.5 x 4.0 cm, with mass effect on the right
temporal lobe and edema in the white matter of the right temporal
lobe. As a result, the temporal horn of the right lateral ventricle
is likely entrapped. No midline shift. No acute hemorrhage or
hematoma.

No skull fracture or other focal osseous abnormality involving the
skull. Visualized paranasal sinuses, bilateral mastoid air cells and
bilateral middle ear cavities well-aerated. Severe bilateral carotid
siphon and vertebral artery atherosclerosis.
IMPRESSION: 1. Partially calcified likely meningioma arising from the right
temporal bone with maximum measurements approximating 1.5 x 4.0 cm.
The likely meningioma causes mass effect upon the right temporal
lobe with edema in the white matter and likely entrapment of the
temporal horn of the right lateral ventricle.
2. No acute intracranial abnormality.
3. Moderate age related cortical and deep atrophy and mild chronic
microvascular ischemic changes of the white matter.
A non-emergent MRI of the brain may be helpful in further evaluation
to confirm the likely dural origin of the right temporal mass.

## 2016-12-07 DIAGNOSIS — Z7901 Long term (current) use of anticoagulants: Secondary | ICD-10-CM | POA: Diagnosis not present

## 2016-12-07 DIAGNOSIS — I4891 Unspecified atrial fibrillation: Secondary | ICD-10-CM | POA: Diagnosis not present

## 2016-12-29 DIAGNOSIS — Z9861 Coronary angioplasty status: Secondary | ICD-10-CM | POA: Diagnosis not present

## 2016-12-29 DIAGNOSIS — D320 Benign neoplasm of cerebral meninges: Secondary | ICD-10-CM | POA: Diagnosis not present

## 2016-12-29 DIAGNOSIS — I4891 Unspecified atrial fibrillation: Secondary | ICD-10-CM | POA: Diagnosis not present

## 2016-12-29 DIAGNOSIS — J849 Interstitial pulmonary disease, unspecified: Secondary | ICD-10-CM | POA: Diagnosis not present

## 2016-12-29 DIAGNOSIS — C3490 Malignant neoplasm of unspecified part of unspecified bronchus or lung: Secondary | ICD-10-CM | POA: Diagnosis not present

## 2016-12-29 DIAGNOSIS — I509 Heart failure, unspecified: Secondary | ICD-10-CM | POA: Diagnosis not present

## 2016-12-29 DIAGNOSIS — Z86711 Personal history of pulmonary embolism: Secondary | ICD-10-CM | POA: Diagnosis not present

## 2016-12-29 DIAGNOSIS — R5381 Other malaise: Secondary | ICD-10-CM | POA: Diagnosis not present

## 2016-12-29 DIAGNOSIS — Z7901 Long term (current) use of anticoagulants: Secondary | ICD-10-CM | POA: Diagnosis not present

## 2017-01-05 DIAGNOSIS — R531 Weakness: Secondary | ICD-10-CM | POA: Diagnosis not present

## 2017-01-05 DIAGNOSIS — R404 Transient alteration of awareness: Secondary | ICD-10-CM | POA: Diagnosis not present

## 2017-01-07 DIAGNOSIS — R634 Abnormal weight loss: Secondary | ICD-10-CM | POA: Diagnosis not present

## 2017-01-07 DIAGNOSIS — C3490 Malignant neoplasm of unspecified part of unspecified bronchus or lung: Secondary | ICD-10-CM | POA: Diagnosis not present

## 2017-01-07 DIAGNOSIS — I509 Heart failure, unspecified: Secondary | ICD-10-CM | POA: Diagnosis not present

## 2017-01-07 DIAGNOSIS — I2510 Atherosclerotic heart disease of native coronary artery without angina pectoris: Secondary | ICD-10-CM | POA: Diagnosis not present

## 2017-01-07 DIAGNOSIS — C3431 Malignant neoplasm of lower lobe, right bronchus or lung: Secondary | ICD-10-CM | POA: Diagnosis not present

## 2017-01-07 DIAGNOSIS — I4891 Unspecified atrial fibrillation: Secondary | ICD-10-CM | POA: Diagnosis not present

## 2017-01-07 DIAGNOSIS — R2689 Other abnormalities of gait and mobility: Secondary | ICD-10-CM | POA: Diagnosis not present

## 2017-01-07 DIAGNOSIS — R269 Unspecified abnormalities of gait and mobility: Secondary | ICD-10-CM | POA: Diagnosis not present

## 2017-01-07 DIAGNOSIS — J849 Interstitial pulmonary disease, unspecified: Secondary | ICD-10-CM | POA: Diagnosis not present

## 2017-01-07 DIAGNOSIS — I130 Hypertensive heart and chronic kidney disease with heart failure and stage 1 through stage 4 chronic kidney disease, or unspecified chronic kidney disease: Secondary | ICD-10-CM | POA: Diagnosis not present

## 2017-01-07 DIAGNOSIS — R5381 Other malaise: Secondary | ICD-10-CM | POA: Diagnosis not present

## 2017-01-07 DIAGNOSIS — N183 Chronic kidney disease, stage 3 (moderate): Secondary | ICD-10-CM | POA: Diagnosis not present

## 2017-01-07 DIAGNOSIS — M6281 Muscle weakness (generalized): Secondary | ICD-10-CM | POA: Diagnosis not present

## 2017-01-10 DIAGNOSIS — J849 Interstitial pulmonary disease, unspecified: Secondary | ICD-10-CM | POA: Diagnosis not present

## 2017-01-10 DIAGNOSIS — I130 Hypertensive heart and chronic kidney disease with heart failure and stage 1 through stage 4 chronic kidney disease, or unspecified chronic kidney disease: Secondary | ICD-10-CM | POA: Diagnosis not present

## 2017-01-10 DIAGNOSIS — C3431 Malignant neoplasm of lower lobe, right bronchus or lung: Secondary | ICD-10-CM | POA: Diagnosis not present

## 2017-01-10 DIAGNOSIS — I4891 Unspecified atrial fibrillation: Secondary | ICD-10-CM | POA: Diagnosis not present

## 2017-01-10 DIAGNOSIS — I509 Heart failure, unspecified: Secondary | ICD-10-CM | POA: Diagnosis not present

## 2017-01-10 DIAGNOSIS — N183 Chronic kidney disease, stage 3 (moderate): Secondary | ICD-10-CM | POA: Diagnosis not present

## 2017-01-10 DIAGNOSIS — I2510 Atherosclerotic heart disease of native coronary artery without angina pectoris: Secondary | ICD-10-CM | POA: Diagnosis not present

## 2017-01-10 DIAGNOSIS — R634 Abnormal weight loss: Secondary | ICD-10-CM | POA: Diagnosis not present

## 2017-01-10 DIAGNOSIS — R5381 Other malaise: Secondary | ICD-10-CM | POA: Diagnosis not present

## 2017-01-11 DIAGNOSIS — I509 Heart failure, unspecified: Secondary | ICD-10-CM | POA: Diagnosis not present

## 2017-01-11 DIAGNOSIS — N183 Chronic kidney disease, stage 3 (moderate): Secondary | ICD-10-CM | POA: Diagnosis not present

## 2017-01-11 DIAGNOSIS — R5381 Other malaise: Secondary | ICD-10-CM | POA: Diagnosis not present

## 2017-01-11 DIAGNOSIS — I2510 Atherosclerotic heart disease of native coronary artery without angina pectoris: Secondary | ICD-10-CM | POA: Diagnosis not present

## 2017-01-11 DIAGNOSIS — I130 Hypertensive heart and chronic kidney disease with heart failure and stage 1 through stage 4 chronic kidney disease, or unspecified chronic kidney disease: Secondary | ICD-10-CM | POA: Diagnosis not present

## 2017-01-11 DIAGNOSIS — J849 Interstitial pulmonary disease, unspecified: Secondary | ICD-10-CM | POA: Diagnosis not present

## 2017-01-11 DIAGNOSIS — C3431 Malignant neoplasm of lower lobe, right bronchus or lung: Secondary | ICD-10-CM | POA: Diagnosis not present

## 2017-01-11 DIAGNOSIS — R634 Abnormal weight loss: Secondary | ICD-10-CM | POA: Diagnosis not present

## 2017-01-11 DIAGNOSIS — I4891 Unspecified atrial fibrillation: Secondary | ICD-10-CM | POA: Diagnosis not present

## 2017-01-13 DIAGNOSIS — I2510 Atherosclerotic heart disease of native coronary artery without angina pectoris: Secondary | ICD-10-CM | POA: Diagnosis not present

## 2017-01-13 DIAGNOSIS — R5381 Other malaise: Secondary | ICD-10-CM | POA: Diagnosis not present

## 2017-01-13 DIAGNOSIS — I509 Heart failure, unspecified: Secondary | ICD-10-CM | POA: Diagnosis not present

## 2017-01-13 DIAGNOSIS — I130 Hypertensive heart and chronic kidney disease with heart failure and stage 1 through stage 4 chronic kidney disease, or unspecified chronic kidney disease: Secondary | ICD-10-CM | POA: Diagnosis not present

## 2017-01-13 DIAGNOSIS — I4891 Unspecified atrial fibrillation: Secondary | ICD-10-CM | POA: Diagnosis not present

## 2017-01-13 DIAGNOSIS — N183 Chronic kidney disease, stage 3 (moderate): Secondary | ICD-10-CM | POA: Diagnosis not present

## 2017-01-13 DIAGNOSIS — R634 Abnormal weight loss: Secondary | ICD-10-CM | POA: Diagnosis not present

## 2017-01-13 DIAGNOSIS — C3431 Malignant neoplasm of lower lobe, right bronchus or lung: Secondary | ICD-10-CM | POA: Diagnosis not present

## 2017-01-13 DIAGNOSIS — J849 Interstitial pulmonary disease, unspecified: Secondary | ICD-10-CM | POA: Diagnosis not present

## 2017-01-18 DIAGNOSIS — J849 Interstitial pulmonary disease, unspecified: Secondary | ICD-10-CM | POA: Diagnosis not present

## 2017-01-18 DIAGNOSIS — I509 Heart failure, unspecified: Secondary | ICD-10-CM | POA: Diagnosis not present

## 2017-01-18 DIAGNOSIS — R5381 Other malaise: Secondary | ICD-10-CM | POA: Diagnosis not present

## 2017-01-18 DIAGNOSIS — C3431 Malignant neoplasm of lower lobe, right bronchus or lung: Secondary | ICD-10-CM | POA: Diagnosis not present

## 2017-01-18 DIAGNOSIS — I2510 Atherosclerotic heart disease of native coronary artery without angina pectoris: Secondary | ICD-10-CM | POA: Diagnosis not present

## 2017-01-18 DIAGNOSIS — R634 Abnormal weight loss: Secondary | ICD-10-CM | POA: Diagnosis not present

## 2017-01-18 DIAGNOSIS — I4891 Unspecified atrial fibrillation: Secondary | ICD-10-CM | POA: Diagnosis not present

## 2017-01-18 DIAGNOSIS — N183 Chronic kidney disease, stage 3 (moderate): Secondary | ICD-10-CM | POA: Diagnosis not present

## 2017-01-18 DIAGNOSIS — I130 Hypertensive heart and chronic kidney disease with heart failure and stage 1 through stage 4 chronic kidney disease, or unspecified chronic kidney disease: Secondary | ICD-10-CM | POA: Diagnosis not present

## 2017-01-20 DIAGNOSIS — I509 Heart failure, unspecified: Secondary | ICD-10-CM | POA: Diagnosis not present

## 2017-01-20 DIAGNOSIS — N183 Chronic kidney disease, stage 3 (moderate): Secondary | ICD-10-CM | POA: Diagnosis not present

## 2017-01-20 DIAGNOSIS — J849 Interstitial pulmonary disease, unspecified: Secondary | ICD-10-CM | POA: Diagnosis not present

## 2017-01-20 DIAGNOSIS — C3431 Malignant neoplasm of lower lobe, right bronchus or lung: Secondary | ICD-10-CM | POA: Diagnosis not present

## 2017-01-20 DIAGNOSIS — R5381 Other malaise: Secondary | ICD-10-CM | POA: Diagnosis not present

## 2017-01-20 DIAGNOSIS — I2510 Atherosclerotic heart disease of native coronary artery without angina pectoris: Secondary | ICD-10-CM | POA: Diagnosis not present

## 2017-01-20 DIAGNOSIS — R634 Abnormal weight loss: Secondary | ICD-10-CM | POA: Diagnosis not present

## 2017-01-20 DIAGNOSIS — I4891 Unspecified atrial fibrillation: Secondary | ICD-10-CM | POA: Diagnosis not present

## 2017-01-20 DIAGNOSIS — I130 Hypertensive heart and chronic kidney disease with heart failure and stage 1 through stage 4 chronic kidney disease, or unspecified chronic kidney disease: Secondary | ICD-10-CM | POA: Diagnosis not present

## 2017-01-25 DIAGNOSIS — Z86711 Personal history of pulmonary embolism: Secondary | ICD-10-CM | POA: Diagnosis not present

## 2017-01-25 DIAGNOSIS — I4891 Unspecified atrial fibrillation: Secondary | ICD-10-CM | POA: Diagnosis not present

## 2017-01-25 DIAGNOSIS — Z7901 Long term (current) use of anticoagulants: Secondary | ICD-10-CM | POA: Diagnosis not present

## 2017-01-26 DIAGNOSIS — R5381 Other malaise: Secondary | ICD-10-CM | POA: Diagnosis not present

## 2017-01-26 DIAGNOSIS — R634 Abnormal weight loss: Secondary | ICD-10-CM | POA: Diagnosis not present

## 2017-01-26 DIAGNOSIS — I4891 Unspecified atrial fibrillation: Secondary | ICD-10-CM | POA: Diagnosis not present

## 2017-01-26 DIAGNOSIS — I130 Hypertensive heart and chronic kidney disease with heart failure and stage 1 through stage 4 chronic kidney disease, or unspecified chronic kidney disease: Secondary | ICD-10-CM | POA: Diagnosis not present

## 2017-01-26 DIAGNOSIS — I2510 Atherosclerotic heart disease of native coronary artery without angina pectoris: Secondary | ICD-10-CM | POA: Diagnosis not present

## 2017-01-26 DIAGNOSIS — N183 Chronic kidney disease, stage 3 (moderate): Secondary | ICD-10-CM | POA: Diagnosis not present

## 2017-01-26 DIAGNOSIS — C3431 Malignant neoplasm of lower lobe, right bronchus or lung: Secondary | ICD-10-CM | POA: Diagnosis not present

## 2017-01-26 DIAGNOSIS — J849 Interstitial pulmonary disease, unspecified: Secondary | ICD-10-CM | POA: Diagnosis not present

## 2017-01-26 DIAGNOSIS — I509 Heart failure, unspecified: Secondary | ICD-10-CM | POA: Diagnosis not present

## 2017-01-28 DIAGNOSIS — I4891 Unspecified atrial fibrillation: Secondary | ICD-10-CM | POA: Diagnosis not present

## 2017-01-28 DIAGNOSIS — J849 Interstitial pulmonary disease, unspecified: Secondary | ICD-10-CM | POA: Diagnosis not present

## 2017-01-28 DIAGNOSIS — I2510 Atherosclerotic heart disease of native coronary artery without angina pectoris: Secondary | ICD-10-CM | POA: Diagnosis not present

## 2017-01-28 DIAGNOSIS — R5381 Other malaise: Secondary | ICD-10-CM | POA: Diagnosis not present

## 2017-01-28 DIAGNOSIS — N183 Chronic kidney disease, stage 3 (moderate): Secondary | ICD-10-CM | POA: Diagnosis not present

## 2017-01-28 DIAGNOSIS — I509 Heart failure, unspecified: Secondary | ICD-10-CM | POA: Diagnosis not present

## 2017-01-28 DIAGNOSIS — I130 Hypertensive heart and chronic kidney disease with heart failure and stage 1 through stage 4 chronic kidney disease, or unspecified chronic kidney disease: Secondary | ICD-10-CM | POA: Diagnosis not present

## 2017-01-28 DIAGNOSIS — C3431 Malignant neoplasm of lower lobe, right bronchus or lung: Secondary | ICD-10-CM | POA: Diagnosis not present

## 2017-01-28 DIAGNOSIS — R634 Abnormal weight loss: Secondary | ICD-10-CM | POA: Diagnosis not present

## 2017-01-30 ENCOUNTER — Emergency Department (HOSPITAL_COMMUNITY): Payer: Medicare HMO

## 2017-01-30 ENCOUNTER — Inpatient Hospital Stay (HOSPITAL_COMMUNITY): Payer: Medicare HMO

## 2017-01-30 ENCOUNTER — Encounter (HOSPITAL_COMMUNITY): Payer: Self-pay

## 2017-01-30 ENCOUNTER — Inpatient Hospital Stay (HOSPITAL_COMMUNITY)
Admission: EM | Admit: 2017-01-30 | Discharge: 2017-02-08 | DRG: 871 | Disposition: E | Payer: Medicare HMO | Attending: Internal Medicine | Admitting: Internal Medicine

## 2017-01-30 DIAGNOSIS — S0990XA Unspecified injury of head, initial encounter: Secondary | ICD-10-CM | POA: Diagnosis not present

## 2017-01-30 DIAGNOSIS — I480 Paroxysmal atrial fibrillation: Secondary | ICD-10-CM | POA: Diagnosis present

## 2017-01-30 DIAGNOSIS — C3411 Malignant neoplasm of upper lobe, right bronchus or lung: Secondary | ICD-10-CM | POA: Diagnosis not present

## 2017-01-30 DIAGNOSIS — Z86711 Personal history of pulmonary embolism: Secondary | ICD-10-CM

## 2017-01-30 DIAGNOSIS — I2699 Other pulmonary embolism without acute cor pulmonale: Secondary | ICD-10-CM

## 2017-01-30 DIAGNOSIS — R52 Pain, unspecified: Secondary | ICD-10-CM | POA: Diagnosis not present

## 2017-01-30 DIAGNOSIS — R6521 Severe sepsis with septic shock: Secondary | ICD-10-CM | POA: Diagnosis not present

## 2017-01-30 DIAGNOSIS — Z79891 Long term (current) use of opiate analgesic: Secondary | ICD-10-CM

## 2017-01-30 DIAGNOSIS — N179 Acute kidney failure, unspecified: Secondary | ICD-10-CM | POA: Diagnosis present

## 2017-01-30 DIAGNOSIS — N183 Chronic kidney disease, stage 3 (moderate): Secondary | ICD-10-CM | POA: Diagnosis present

## 2017-01-30 DIAGNOSIS — R54 Age-related physical debility: Secondary | ICD-10-CM | POA: Diagnosis present

## 2017-01-30 DIAGNOSIS — S299XXA Unspecified injury of thorax, initial encounter: Secondary | ICD-10-CM | POA: Diagnosis not present

## 2017-01-30 DIAGNOSIS — Z79899 Other long term (current) drug therapy: Secondary | ICD-10-CM

## 2017-01-30 DIAGNOSIS — N400 Enlarged prostate without lower urinary tract symptoms: Secondary | ICD-10-CM | POA: Diagnosis present

## 2017-01-30 DIAGNOSIS — A419 Sepsis, unspecified organism: Principal | ICD-10-CM | POA: Diagnosis present

## 2017-01-30 DIAGNOSIS — Z86718 Personal history of other venous thrombosis and embolism: Secondary | ICD-10-CM

## 2017-01-30 DIAGNOSIS — Y92019 Unspecified place in single-family (private) house as the place of occurrence of the external cause: Secondary | ICD-10-CM | POA: Diagnosis not present

## 2017-01-30 DIAGNOSIS — S72001A Fracture of unspecified part of neck of right femur, initial encounter for closed fracture: Secondary | ICD-10-CM | POA: Diagnosis not present

## 2017-01-30 DIAGNOSIS — R634 Abnormal weight loss: Secondary | ICD-10-CM | POA: Diagnosis present

## 2017-01-30 DIAGNOSIS — I130 Hypertensive heart and chronic kidney disease with heart failure and stage 1 through stage 4 chronic kidney disease, or unspecified chronic kidney disease: Secondary | ICD-10-CM | POA: Diagnosis not present

## 2017-01-30 DIAGNOSIS — I5022 Chronic systolic (congestive) heart failure: Secondary | ICD-10-CM | POA: Diagnosis not present

## 2017-01-30 DIAGNOSIS — D509 Iron deficiency anemia, unspecified: Secondary | ICD-10-CM | POA: Diagnosis present

## 2017-01-30 DIAGNOSIS — Z9861 Coronary angioplasty status: Secondary | ICD-10-CM

## 2017-01-30 DIAGNOSIS — R296 Repeated falls: Secondary | ICD-10-CM | POA: Diagnosis present

## 2017-01-30 DIAGNOSIS — Z7901 Long term (current) use of anticoagulants: Secondary | ICD-10-CM

## 2017-01-30 DIAGNOSIS — S7291XA Unspecified fracture of right femur, initial encounter for closed fracture: Secondary | ICD-10-CM | POA: Diagnosis not present

## 2017-01-30 DIAGNOSIS — W010XXA Fall on same level from slipping, tripping and stumbling without subsequent striking against object, initial encounter: Secondary | ICD-10-CM | POA: Diagnosis present

## 2017-01-30 DIAGNOSIS — M79604 Pain in right leg: Secondary | ICD-10-CM | POA: Diagnosis not present

## 2017-01-30 DIAGNOSIS — D500 Iron deficiency anemia secondary to blood loss (chronic): Secondary | ICD-10-CM | POA: Diagnosis not present

## 2017-01-30 DIAGNOSIS — M25511 Pain in right shoulder: Secondary | ICD-10-CM | POA: Diagnosis not present

## 2017-01-30 DIAGNOSIS — Z515 Encounter for palliative care: Secondary | ICD-10-CM | POA: Diagnosis not present

## 2017-01-30 DIAGNOSIS — D649 Anemia, unspecified: Secondary | ICD-10-CM | POA: Diagnosis not present

## 2017-01-30 DIAGNOSIS — D72819 Decreased white blood cell count, unspecified: Secondary | ICD-10-CM | POA: Diagnosis present

## 2017-01-30 DIAGNOSIS — M9701XA Periprosthetic fracture around internal prosthetic right hip joint, initial encounter: Secondary | ICD-10-CM | POA: Diagnosis present

## 2017-01-30 DIAGNOSIS — Z85830 Personal history of malignant neoplasm of bone: Secondary | ICD-10-CM

## 2017-01-30 DIAGNOSIS — E871 Hypo-osmolality and hyponatremia: Secondary | ICD-10-CM | POA: Diagnosis present

## 2017-01-30 DIAGNOSIS — J9601 Acute respiratory failure with hypoxia: Secondary | ICD-10-CM | POA: Diagnosis not present

## 2017-01-30 DIAGNOSIS — W19XXXA Unspecified fall, initial encounter: Secondary | ICD-10-CM

## 2017-01-30 DIAGNOSIS — M19049 Primary osteoarthritis, unspecified hand: Secondary | ICD-10-CM | POA: Diagnosis present

## 2017-01-30 DIAGNOSIS — Z888 Allergy status to other drugs, medicaments and biological substances status: Secondary | ICD-10-CM

## 2017-01-30 DIAGNOSIS — J189 Pneumonia, unspecified organism: Secondary | ICD-10-CM | POA: Diagnosis present

## 2017-01-30 DIAGNOSIS — R402413 Glasgow coma scale score 13-15, at hospital admission: Secondary | ICD-10-CM | POA: Diagnosis present

## 2017-01-30 DIAGNOSIS — D630 Anemia in neoplastic disease: Secondary | ICD-10-CM | POA: Diagnosis present

## 2017-01-30 DIAGNOSIS — S4991XA Unspecified injury of right shoulder and upper arm, initial encounter: Secondary | ICD-10-CM | POA: Diagnosis not present

## 2017-01-30 DIAGNOSIS — I4891 Unspecified atrial fibrillation: Secondary | ICD-10-CM | POA: Diagnosis present

## 2017-01-30 DIAGNOSIS — D631 Anemia in chronic kidney disease: Secondary | ICD-10-CM | POA: Diagnosis present

## 2017-01-30 DIAGNOSIS — H269 Unspecified cataract: Secondary | ICD-10-CM | POA: Diagnosis present

## 2017-01-30 DIAGNOSIS — R0609 Other forms of dyspnea: Secondary | ICD-10-CM | POA: Diagnosis not present

## 2017-01-30 DIAGNOSIS — E785 Hyperlipidemia, unspecified: Secondary | ICD-10-CM | POA: Diagnosis present

## 2017-01-30 DIAGNOSIS — I2510 Atherosclerotic heart disease of native coronary artery without angina pectoris: Secondary | ICD-10-CM | POA: Diagnosis present

## 2017-01-30 DIAGNOSIS — Z6825 Body mass index (BMI) 25.0-25.9, adult: Secondary | ICD-10-CM

## 2017-01-30 DIAGNOSIS — Z66 Do not resuscitate: Secondary | ICD-10-CM | POA: Diagnosis not present

## 2017-01-30 DIAGNOSIS — S72009A Fracture of unspecified part of neck of unspecified femur, initial encounter for closed fracture: Secondary | ICD-10-CM | POA: Diagnosis present

## 2017-01-30 DIAGNOSIS — Z96641 Presence of right artificial hip joint: Secondary | ICD-10-CM | POA: Diagnosis present

## 2017-01-30 DIAGNOSIS — Z87891 Personal history of nicotine dependence: Secondary | ICD-10-CM

## 2017-01-30 DIAGNOSIS — R0600 Dyspnea, unspecified: Secondary | ICD-10-CM

## 2017-01-30 DIAGNOSIS — I4581 Long QT syndrome: Secondary | ICD-10-CM | POA: Diagnosis present

## 2017-01-30 LAB — I-STAT ARTERIAL BLOOD GAS, ED
ACID-BASE DEFICIT: 9 mmol/L — AB (ref 0.0–2.0)
BICARBONATE: 16 mmol/L — AB (ref 20.0–28.0)
O2 SAT: 91 %
PCO2 ART: 32.6 mmHg (ref 32.0–48.0)
Patient temperature: 98.6
TCO2: 17 mmol/L (ref 0–100)
pH, Arterial: 7.299 — ABNORMAL LOW (ref 7.350–7.450)
pO2, Arterial: 66 mmHg — ABNORMAL LOW (ref 83.0–108.0)

## 2017-01-30 LAB — CBC WITH DIFFERENTIAL/PLATELET
Basophils Absolute: 0 10*3/uL (ref 0.0–0.1)
Basophils Relative: 0 %
EOS ABS: 0 10*3/uL (ref 0.0–0.7)
EOS PCT: 1 %
HCT: 28.8 % — ABNORMAL LOW (ref 39.0–52.0)
Hemoglobin: 9.1 g/dL — ABNORMAL LOW (ref 13.0–17.0)
LYMPHS ABS: 0.6 10*3/uL — AB (ref 0.7–4.0)
Lymphocytes Relative: 19 %
MCH: 28.6 pg (ref 26.0–34.0)
MCHC: 31.6 g/dL (ref 30.0–36.0)
MCV: 90.6 fL (ref 78.0–100.0)
Monocytes Absolute: 0.4 10*3/uL (ref 0.1–1.0)
Monocytes Relative: 12 %
Neutro Abs: 2.2 10*3/uL (ref 1.7–7.7)
Neutrophils Relative %: 68 %
PLATELETS: 204 10*3/uL (ref 150–400)
RBC: 3.18 MIL/uL — AB (ref 4.22–5.81)
RDW: 15.8 % — ABNORMAL HIGH (ref 11.5–15.5)
WBC: 3.3 10*3/uL — AB (ref 4.0–10.5)

## 2017-01-30 LAB — URINALYSIS, ROUTINE W REFLEX MICROSCOPIC
BILIRUBIN URINE: NEGATIVE
Glucose, UA: NEGATIVE mg/dL
HGB URINE DIPSTICK: NEGATIVE
KETONES UR: NEGATIVE mg/dL
Leukocytes, UA: NEGATIVE
Nitrite: NEGATIVE
Protein, ur: NEGATIVE mg/dL
Specific Gravity, Urine: 1.014 (ref 1.005–1.030)
pH: 5 (ref 5.0–8.0)

## 2017-01-30 LAB — COMPREHENSIVE METABOLIC PANEL
ALT: 17 U/L (ref 17–63)
AST: 28 U/L (ref 15–41)
Albumin: 2.4 g/dL — ABNORMAL LOW (ref 3.5–5.0)
Alkaline Phosphatase: 64 U/L (ref 38–126)
Anion gap: 7 (ref 5–15)
BUN: 20 mg/dL (ref 6–20)
CALCIUM: 8.3 mg/dL — AB (ref 8.9–10.3)
CO2: 22 mmol/L (ref 22–32)
CREATININE: 1.4 mg/dL — AB (ref 0.61–1.24)
Chloride: 101 mmol/L (ref 101–111)
GFR calc non Af Amer: 43 mL/min — ABNORMAL LOW (ref >60)
GFR, EST AFRICAN AMERICAN: 50 mL/min — AB (ref >60)
GLUCOSE: 104 mg/dL — AB (ref 65–99)
Potassium: 4.3 mmol/L (ref 3.5–5.1)
SODIUM: 130 mmol/L — AB (ref 135–145)
TOTAL PROTEIN: 6.3 g/dL — AB (ref 6.5–8.1)
Total Bilirubin: 0.7 mg/dL (ref 0.3–1.2)

## 2017-01-30 LAB — IRON AND TIBC
Iron: 28 ug/dL — ABNORMAL LOW (ref 45–182)
Saturation Ratios: 12 % — ABNORMAL LOW (ref 17.9–39.5)
TIBC: 230 ug/dL — AB (ref 250–450)
UIBC: 202 ug/dL

## 2017-01-30 LAB — PROTIME-INR
INR: 3.12
PROTHROMBIN TIME: 32.8 s — AB (ref 11.4–15.2)

## 2017-01-30 LAB — POC OCCULT BLOOD, ED: Fecal Occult Bld: NEGATIVE

## 2017-01-30 LAB — FERRITIN: FERRITIN: 260 ng/mL (ref 24–336)

## 2017-01-30 LAB — TROPONIN I: Troponin I: <0.03 ng/mL (ref <0.03)

## 2017-01-30 LAB — MRSA PCR SCREENING: MRSA by PCR: NEGATIVE

## 2017-01-30 LAB — I-STAT CG4 LACTIC ACID, ED: LACTIC ACID, VENOUS: 1.23 mmol/L (ref 0.5–1.9)

## 2017-01-30 LAB — PROCALCITONIN: Procalcitonin: 0.18 ng/mL

## 2017-01-30 LAB — APTT: APTT: 48 s — AB (ref 24–36)

## 2017-01-30 LAB — FOLATE: Folate: 29.3 ng/mL (ref >5.9)

## 2017-01-30 MED ORDER — TRAZODONE HCL 50 MG PO TABS
25.0000 mg | ORAL_TABLET | Freq: Every evening | ORAL | Status: DC | PRN
  Administered 2017-01-30: 25 mg via ORAL
  Filled 2017-01-30: qty 1

## 2017-01-30 MED ORDER — CARVEDILOL 3.125 MG PO TABS
3.1250 mg | ORAL_TABLET | Freq: Two times a day (BID) | ORAL | Status: DC
  Administered 2017-01-30 – 2017-02-01 (×4): 3.125 mg via ORAL
  Filled 2017-01-30 (×6): qty 1

## 2017-01-30 MED ORDER — ONDANSETRON HCL 4 MG/2ML IJ SOLN: 4.0000 mg | Freq: Four times a day (QID) | INTRAMUSCULAR | Status: DC | PRN

## 2017-01-30 MED ORDER — POLYETHYLENE GLYCOL 3350 17 G PO PACK: 17.0000 g | PACK | Freq: Every day | ORAL | Status: DC | PRN

## 2017-01-30 MED ORDER — WARFARIN SODIUM 1 MG PO TABS: 1.0000 mg | ORAL_TABLET | Freq: Once | ORAL | Status: DC

## 2017-01-30 MED ORDER — DEXTROSE 5 % IV SOLN
500.0000 mg | Freq: Once | INTRAVENOUS | Status: AC
  Administered 2017-01-30: 500 mg via INTRAVENOUS
  Filled 2017-01-30: qty 500

## 2017-01-30 MED ORDER — OMEGA-3 FATTY ACIDS 1000 MG PO CAPS
2.0000 g | ORAL_CAPSULE | Freq: Every day | ORAL | Status: DC
  Filled 2017-01-30 (×2): qty 2

## 2017-01-30 MED ORDER — SODIUM CHLORIDE 0.9% FLUSH
3.0000 mL | Freq: Two times a day (BID) | INTRAVENOUS | Status: DC
  Administered 2017-01-30 – 2017-02-02 (×7): 3 mL via INTRAVENOUS

## 2017-01-30 MED ORDER — IOPAMIDOL (ISOVUE-370) INJECTION 76%
INTRAVENOUS | Status: AC
  Administered 2017-01-30: 80 mL
  Filled 2017-01-30: qty 100

## 2017-01-30 MED ORDER — SODIUM CHLORIDE 0.9 % IV BOLUS (SEPSIS)
1000.0000 mL | Freq: Once | INTRAVENOUS | Status: AC
  Administered 2017-01-30: 1000 mL via INTRAVENOUS

## 2017-01-30 MED ORDER — PRAVASTATIN SODIUM 40 MG PO TABS
40.0000 mg | ORAL_TABLET | Freq: Every day | ORAL | Status: DC
  Administered 2017-01-30 – 2017-02-02 (×4): 40 mg via ORAL
  Filled 2017-01-30 (×4): qty 1

## 2017-01-30 MED ORDER — VANCOMYCIN HCL IN DEXTROSE 1-5 GM/200ML-% IV SOLN
1000.0000 mg | INTRAVENOUS | Status: DC
  Filled 2017-01-30: qty 200

## 2017-01-30 MED ORDER — IPRATROPIUM-ALBUTEROL 0.5-2.5 (3) MG/3ML IN SOLN
3.0000 mL | RESPIRATORY_TRACT | Status: DC
  Administered 2017-01-30: 3 mL via RESPIRATORY_TRACT
  Filled 2017-01-30: qty 3

## 2017-01-30 MED ORDER — ORAL CARE MOUTH RINSE
15.0000 mL | Freq: Two times a day (BID) | OROMUCOSAL | Status: DC
  Administered 2017-01-31 – 2017-02-02 (×6): 15 mL via OROMUCOSAL

## 2017-01-30 MED ORDER — ONDANSETRON HCL 4 MG PO TABS: 4.0000 mg | ORAL_TABLET | Freq: Four times a day (QID) | ORAL | Status: DC | PRN

## 2017-01-30 MED ORDER — CALCIUM CARBONATE-VITAMIN D 500-200 MG-UNIT PO TABS
1.0000 | ORAL_TABLET | Freq: Every day | ORAL | Status: DC
  Administered 2017-01-31 – 2017-02-02 (×3): 1 via ORAL
  Filled 2017-01-30 (×4): qty 1

## 2017-01-30 MED ORDER — FUROSEMIDE 20 MG PO TABS: 20.0000 mg | ORAL_TABLET | Freq: Every day | ORAL | Status: DC

## 2017-01-30 MED ORDER — TAMSULOSIN HCL 0.4 MG PO CAPS
0.4000 mg | ORAL_CAPSULE | Freq: Every day | ORAL | Status: DC
  Administered 2017-01-30 – 2017-02-02 (×4): 0.4 mg via ORAL
  Filled 2017-01-30 (×4): qty 1

## 2017-01-30 MED ORDER — VITAMIN D 400 UNITS PO TABS: 400.0000 [IU] | ORAL_TABLET | Freq: Every day | ORAL | Status: DC

## 2017-01-30 MED ORDER — DEXTROSE 5 % IV SOLN
1.0000 g | Freq: Once | INTRAVENOUS | Status: AC
  Administered 2017-01-30: 1 g via INTRAVENOUS
  Filled 2017-01-30: qty 10

## 2017-01-30 MED ORDER — HYDROCODONE-ACETAMINOPHEN 5-325 MG PO TABS: 1.0000 | ORAL_TABLET | Freq: Four times a day (QID) | ORAL | Status: DC | PRN

## 2017-01-30 MED ORDER — SODIUM CHLORIDE 0.9 % IV BOLUS (SEPSIS)
250.0000 mL | Freq: Once | INTRAVENOUS | Status: AC
  Administered 2017-01-30: 250 mL via INTRAVENOUS

## 2017-01-30 MED ORDER — SODIUM CHLORIDE 0.9 % IV SOLN
1500.0000 mg | Freq: Once | INTRAVENOUS | Status: AC
  Administered 2017-01-30: 1500 mg via INTRAVENOUS
  Filled 2017-01-30: qty 1500

## 2017-01-30 MED ORDER — IPRATROPIUM-ALBUTEROL 0.5-2.5 (3) MG/3ML IN SOLN
3.0000 mL | Freq: Three times a day (TID) | RESPIRATORY_TRACT | Status: DC
  Administered 2017-01-31 (×2): 3 mL via RESPIRATORY_TRACT
  Filled 2017-01-30 (×2): qty 3

## 2017-01-30 MED ORDER — FINASTERIDE 5 MG PO TABS: 5.0000 mg | ORAL_TABLET | Freq: Every day | ORAL | Status: DC

## 2017-01-30 MED ORDER — FUROSEMIDE 40 MG PO TABS
20.0000 mg | ORAL_TABLET | Freq: Every day | ORAL | Status: DC
  Administered 2017-01-30 – 2017-01-31 (×2): 20 mg via ORAL
  Filled 2017-01-30 (×2): qty 1

## 2017-01-30 MED ORDER — WARFARIN SODIUM 1 MG PO TABS
1.0000 mg | ORAL_TABLET | Freq: Once | ORAL | Status: AC
  Administered 2017-01-30: 1 mg via ORAL
  Filled 2017-01-30 (×2): qty 1

## 2017-01-30 MED ORDER — WARFARIN - PHARMACIST DOSING INPATIENT
Freq: Every day | Status: DC
  Administered 2017-01-31: 1

## 2017-01-30 MED ORDER — VITAMIN D 1000 UNITS PO TABS
1000.0000 [IU] | ORAL_TABLET | Freq: Every day | ORAL | Status: DC
  Administered 2017-01-31 – 2017-02-02 (×3): 1000 [IU] via ORAL
  Filled 2017-01-30 (×3): qty 1

## 2017-01-30 MED ORDER — DEXTROSE 5 % IV SOLN
2.0000 g | INTRAVENOUS | Status: DC
  Administered 2017-01-30 – 2017-02-01 (×3): 2 g via INTRAVENOUS
  Filled 2017-01-30 (×4): qty 2

## 2017-01-30 MED ORDER — IPRATROPIUM BROMIDE 0.02 % IN SOLN
0.5000 mg | RESPIRATORY_TRACT | Status: DC | PRN
Start: 2017-01-30 — End: 2017-01-31

## 2017-01-30 NOTE — Plan of Care (Signed)
Problem: Safety: Goal: Ability to remain free from injury will improve Outcome: Progressing Discussed with the patient the need to keep his oxygen and telemetry leads on with some teach back displayed.  Comments: Safety Mitts placed after patient removed his IV out

## 2017-01-30 NOTE — Progress Notes (Signed)
Patient Name: Darrell Mendoza, male   DOB: December 08, 1924, 83 y.o.  MRN: [id number]   After fluid resuscitation, vitals stable for floor - BP 120s/70s. Still unable to contact wife. Will send to stepdown. CTA pending. Per patient - if indicated, willing to be on pressors.  Truett Mainland, DO 01/25/2017 6:41 PM

## 2017-01-30 NOTE — ED Provider Notes (Addendum)
[facility] DEPT Provider Note   CSN: [id number] Arrival date & time: 01/24/2017  1113     History   Chief Complaint Chief Complaint  Patient presents with  . Fall    HPI Fue Cervenka is a 83 y.o. male.  Level V caveat for urgent need for intervention. Most of history obtained from wife. Wife reports increased confusion this morning. He fell yesterday striking his right hip. 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 He has many medical problems including right lung cancer for which she is getting radiation, chronic kidney disease, coronary artery disease, Greenfield filter, many others. He is normally alert and interactive.      Past Medical History:  Diagnosis Date  . Atrial fibrillation ([place]) 12/11/2015  . Benign prostatic hypertrophy   . Bilateral cataracts   . CKD (chronic kidney disease) stage 3, GFR 30-59 ml/min   . Coronary artery disease    s/p 3.0x23 cypher mid rca 2004  . Deep vein thrombosis ([facility])    Coumadin anticoagulation for deep vein thrombosis  . Greenfield filter in place    History of chronic lower extremity clots  . Hand arthritis   . Hyperlipidemia   . Hypertension     History of hypertension  . Hypotension   . Lightheadedness   . 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 Premature ventricular contractions   . Pulmonary embolism ([place])    05/2011    Patient Active Problem List   Diagnosis Date Noted  . Hip fracture ([place]) 01/09/2017  . Malignant neoplasm of right upper lobe of lung ([place]) 07/12/2016  . Palliative care encounter 02/12/2016  . DNR (do not resuscitate) 02/12/2016  . Weakness generalized 02/12/2016  . Lung mass   . Acute respiratory failure with hypoxia ([place]) 02/11/2016  . UTI (lower urinary tract infection) 02/11/2016  . Compression fracture of L1 lumbar vertebra ([place]) 02/11/2016  . Chronic systolic CHF (congestive heart failure) ([place]) 01/26/2016  .  Nonischemic cardiomyopathy ([place]) 01/26/2016  . Atrial fibrillation ([place]) 12/11/2015  . Pulmonary embolism ([place])   . Deep vein thrombosis ([place])   . Hyperlipidemia   . Hypertension   . Coronary artery disease     Past Surgical History:  Procedure Laterality Date  . CARDIAC CATHETERIZATION  10/22/2002   left heart cath  . EXCISON TOENAIL Right    HALLUX  . HIP SURGERY  2001-2001   right hip surgery  . ROTATOR CUFF REPAIR  1985   right rotator cuff surgery  . [place]   for a hernia       Home Medications    Prior to Admission medications   Medication Sig Start Date End Date Taking? Authorizing Provider  calcium-vitamin D (OSCAL WITH D) 250-125 MG-UNIT per tablet Take 1 tablet by mouth daily.    Historical Provider, MD  carvedilol (COREG) 3.125 MG tablet TAKE 1 TABLET BY MOUTH TWICE A DAY 11/01/16   Peter M Martinique, MD  finasteride (PROSCAR) 5 MG tablet Take 5 mg by mouth daily. 12/26/15   Historical Provider, MD  fish oil-omega-3 fatty acids 1000 MG capsule Take 2 g by mouth daily.    Historical Provider, MD  furosemide (LASIX) 20 MG tablet Take 1 tablet by mouth daily. 07/16/16   Historical Provider, MD  HYDROcodone-acetaminophen (NORCO/VICODIN) 5-325 MG tablet Take 1 tablet by mouth every 6 (six) hours as needed. 03/19/16  Historical Provider, MD  Multiple Vitamin (MULTIVITAMIN) tablet Take 1 tablet by mouth daily.    Historical Provider, MD  pravastatin (PRAVACHOL) 40 MG tablet Take 40 mg by mouth daily.    Historical Provider, MD  Tamsulosin HCl (FLOMAX) 0.4 MG CAPS Take 0.4 mg by mouth daily.    Historical Provider, MD  vitamin D, CHOLECALCIFEROL, 400 UNITS tablet Take 400 Units by mouth daily.    Historical Provider, MD  warfarin (COUMADIN) 5 MG tablet Take 0.5-1 tablets (2.5-5 mg total) by mouth daily. Take 2.5 mg Sun / Tues / Wed / Thurs / Sat Take 5 mg on Mon / Fri 02/16/16   Reyne Dumas, MD    Family History Family History  Problem Relation Age of Onset  .  Tuberculosis Mother     Social History Social History  Substance Use Topics  . Smoking status: Former Smoker    Packs/day: 1.00    Years: 30.00    Types: Cigarettes    Quit date: 10/12/1967  . Smokeless tobacco: Former Systems developer    Quit date: 10/20/1983  . Alcohol use 0.0 oz/week     Comment: very little     Allergies   Lisinopril   Review of Systems Review of Systems  Reason unable to perform ROS: Urgent need for intervention.     Physical Exam Updated Vital Signs BP 113/71   Pulse 98   Temp 97.8 F (36.6 C) (Oral)   Resp (!) 29   Ht '5\' 7"'$  (1.702 m)   Wt 151 lb (68.5 kg)   SpO2 96%   BMI 23.65 kg/m   Physical Exam  Constitutional: He is oriented to person, place, and time.  Confused, frail, pale  HENT:  Head: Normocephalic and atraumatic.  Eyes: Conjunctivae are normal.  Neck: Neck supple.  Cardiovascular: Normal rate and regular rhythm.   Pulmonary/Chest: Effort normal and breath sounds normal.  Abdominal: Soft. Bowel sounds are normal.  Genitourinary:  Genitourinary Comments: Rectal exam: No masses. Heme negative.  Musculoskeletal:  Tender right lateral hip and prox right humerus  Neurological: He is alert and oriented to person, place, and time.  Skin: Skin is warm and dry.  Psychiatric: He has a normal mood and affect. His behavior is normal.  Nursing note and vitals reviewed.    ED Treatments / Results  Labs (all labs ordered are listed, but only abnormal results are displayed) Labs Reviewed  CBC WITH DIFFERENTIAL/PLATELET - Abnormal; Notable for the following:       Result Value   WBC 3.3 (*)    RBC 3.18 (*)    Hemoglobin 9.1 (*)    HCT 28.8 (*)    RDW 15.8 (*)    Lymphs Abs 0.6 (*)    All other components within normal limits  COMPREHENSIVE METABOLIC PANEL - Abnormal; Notable for the following:    Sodium 130 (*)    Glucose, Bld 104 (*)    Creatinine, Ser 1.40 (*)    Calcium 8.3 (*)    Total Protein 6.3 (*)    Albumin 2.4 (*)    GFR calc  non Af Amer 43 (*)    GFR calc Af Amer 50 (*)    All other components within normal limits  TROPONIN I  URINALYSIS, ROUTINE W REFLEX MICROSCOPIC  OCCULT BLOOD X 1 CARD TO LAB, STOOL  POC OCCULT BLOOD, ED    EKG  EKG Interpretation  Date/Time:  Sunday January 30 2017 11:26:56 EDT Ventricular Rate:  117 PR Interval:  QRS Duration: 90 QT Interval:  359 QTC Calculation: 501 R Axis:   34 Text Interpretation:  Atrial fibrillation Multiple ventricular premature complexes Borderline repolarization abnormality Prolonged QT interval Confirmed by Lacinda Axon  MD, Icesis Renn ([id number]) on 01/26/2017 11:46:23 AM       Radiology Dg Chest 2 View  Result Date: 01/12/2017 CLINICAL DATA:  Status post fall this morning. Right upper arm pain. EXAM: CHEST  2 VIEW COMPARISON:  Single-view of the chest 02/11/2016. CT chest 10/29/2016 and 06/02/2016. FINDINGS: The lungs are severely emphysematous. Opacity in the right upper lobe consistent with the patient's known mass appears worse than on the most recent comparison CT scan. There is a right pleural effusion and basilar airspace disease. Heart size is enlarged. IMPRESSION: Increase in the size of the patient's known right upper lobe mass. Right pleural effusion and basilar airspace disease which could be atelectasis or pneumonia. Severe emphysema. Electronically Signed   By: Inge Rise M.D.   On: 01/31/2017 12:29   Ct Head Wo Contrast  Result Date: 01/20/2017 CLINICAL DATA:  Status post fall.  Weakness. EXAM: CT HEAD WITHOUT CONTRAST TECHNIQUE: Contiguous axial images were obtained from the base of the skull through the vertex without intravenous contrast. COMPARISON:  Head CT, 02/11/2016 FINDINGS: Brain: No evidence of acute infarction, hemorrhage, hydrocephalus, extra-axial collection or mass lesion/mass effect. Partly calcified extra-axial mass adjacent to the anterior right temporal lobe is unchanged, consistent with a meningioma. There is a cyst in the right  temporal lobe adjacent to the meningioma that has increased from the prior exam now measuring 2.6 x 2.0 cm transversely, previously 2.0 x 1.4 cm. It measures 2.7 cm from superior inferior. The ventricles are enlarged reflecting age appropriate volume loss. There is no hydrocephalus. Periventricular white matter hypoattenuation is stable consistent with mild chronic microvascular ischemic change. Vascular: No hyperdense vessel or unexpected calcification. Skull: Normal. Negative for fracture or focal lesion. Sinuses/Orbits: Globes and orbits are unremarkable. The sinuses and mastoid air cells are clear. Other: None. IMPRESSION: 1. No acute intracranial abnormalities. 2. Stable right temporal region meningioma. Electronically Signed   By: Lajean Manes M.D.   On: 02/07/2017 13:09   Dg Humerus Right  Result Date: 01/23/2017 CLINICAL DATA:  Mid right humerus pain following a fall at home this morning. EXAM: RIGHT HUMERUS - 2+ VIEW COMPARISON:  None. FINDINGS: Diffuse osteopenia. No fracture or dislocation. Mild right elbow degenerative changes. IMPRESSION: No fracture. Electronically Signed   By: Claudie Revering M.D.   On: 01/17/2017 12:30   Dg Hip Unilat W Or Wo Pelvis 2-3 Views Right  Result Date: 01/22/2017 CLINICAL DATA:  Fall.  Initial encounter. EXAM: DG HIP (WITH OR WITHOUT PELVIS) 2-3V RIGHT COMPARISON:  02/11/2016 FINDINGS: Sequelae of right hip arthroplasty are again identified. There is a new nondisplaced transversely oriented fracture through the lateral aspect of the femur at the level of the proximal femoral stem. There are additional less conspicuous curvilinear lucencies more proximally extending obliquely through the greater trochanter which may reflect extension of the nondisplaced fracture, however some of these are not clearly new from the prior study and could reflect vascular channels instead. No clear extension of the fracture medial to the prosthesis is identified. There is no lucency  suggestive of loosening or infection about the prosthesis. There is no dislocation. Heterotopic ossification is again seen lateral to the right hip. Limited evaluation of the left hip is without evidence of acute osseous abnormality. Prior lumbosacral fusion is noted. The bones are osteopenic.  IMPRESSION: Nondisplaced fracture lateral to the proximal right femoral stem, with possible involvement of the greater trochanter as well. Electronically Signed   By: Logan Bores M.D.   On: 01/16/2017 14:53    Procedures Procedures (including critical care time)  Medications Ordered in ED Medications  azithromycin (ZITHROMAX) 500 mg in dextrose 5 % 250 mL IVPB (500 mg Intravenous New Bag/Given 02/02/2017 1551)  sodium chloride 0.9 % bolus 1,000 mL (0 mLs Intravenous Stopped 01/25/2017 1614)  cefTRIAXone (ROCEPHIN) 1 g in dextrose 5 % 50 mL IVPB (0 g Intravenous Stopped 01/16/2017 1516)     Initial Impression / Assessment and Plan / ED Course  I have reviewed the triage vital signs and the nursing notes.  Pertinent labs & imaging results that were available during my care of the patient were reviewed by me and considered in my medical decision making (see chart for details).    CRITICAL CARE Performed by: Nat Christen Total critical care time: 30 minutes Critical care time was exclusive of separately billable procedures and treating other patients. Critical care was necessary to treat or prevent imminent or life-threatening deterioration. Critical care was time spent personally by me on the following activities: development of treatment plan with patient and/or surrogate as well as nursing, discussions with consultants, evaluation of patient's response to treatment, examination of patient, obtaining history from patient or surrogate, ordering and performing treatments and interventions, ordering and review of laboratory studies, ordering and review of radiographic studies, pulse oximetry and re-evaluation of  patient's condition.  Patient has many medical issues. He fell yesterday resulting in a fractured right femur. Additionally, he has right-sided lung cancer [for which he is getting radiation] and right basilar pneumonia.  He is anemic with hemoglobin dropping from 12.9-9.1.   Antibiotics for community-acquired pneumonia started. Discussed orthopedic issue with Dr. Ninfa Linden.  Will admit to general medicine.  Final Clinical Impressions(s) / ED Diagnoses   Final diagnoses:  Fall, initial encounter  Closed fracture of right femur, unspecified fracture morphology, initial encounter ([place])  Anemia, unspecified type  Malignant neoplasm of upper lobe of right lung ([place])  Community acquired pneumonia of right lung, unspecified part of lung    New Prescriptions New Prescriptions   No medications on file     Nat Christen, MD 01/22/2017 [place], MD 02/02/2017 [phone]

## 2017-01-30 NOTE — ED Notes (Addendum)
Admitting at bedside, aware of pt BP.

## 2017-01-30 NOTE — Progress Notes (Signed)
[facility] for warfarin Indication: atrial fibrillation   Assessment: 89 yom on warfarin PTA for afib, also hx DVT/PE, presents s/p fall with hip fracture - per Ortho note, surgery not anticipated at this time. CT head negative. Pharmacy consulted to dose while inpatient. CTA to r/o PE is negative, INR slightly supratherapeutic on admit at 3.12 - will give smaller dose tonight. Hg 9.1, plt wnl. No bleed reported. Noted received ceftriaxone/azithro x 1 for CAP in the ED.  PTA warfarin dose: 2.'5mg'$  PO daily (last dose 4/21 PTA)  Goal of Therapy:  INR 2-3 Monitor platelets by anticoagulation protocol: Yes   Plan:  Warfarin '1mg'$  PO x 1 dose Daily INR Monitor CBC, s/sx bleeding   Elicia Lamp, PharmD, BCPS Clinical Pharmacist 02/02/2017 4:43 PM

## 2017-01-30 NOTE — Consult Note (Signed)
Reason for Consult:  Right periprosthetic proximal femur/hip fracture Referring Physician: Nat Christen, MD/EDP  Darrell Mendoza is an 83 y.o. male.  HPI: The patient is an 83 year old gentleman with multiple medical problems including lung cancer and atrial fibrillation as well as congestive heart failure who suffered a mechanical fall today. After being brought to the emergency room he had complaints of right hip pain. He has a history of previous right hip hemiarthroplasty secondary to a bone cancer. X-ray showed a fracture of the greater trochanteric area near the prosthesis and orthopedic surgery was consult at. I was able to review the x-ray as well as his chart and then examine him now in the stepdown unit. He does not complain of significant hip pain when he is laying still but with motion and palpation the hip does experience a significant amount of pain. He was admitted by the medical service to the stepdown unit with depressed respirations and possible pneumonia with sepsis workup. He has an INR greater than 3.  Past Medical History:  Diagnosis Date  . Atrial fibrillation ([place]) 12/11/2015  . Benign prostatic hypertrophy   . Bilateral cataracts   . CKD (chronic kidney disease) stage 3, GFR 30-59 ml/min   . Coronary artery disease    s/p 3.0x23 cypher mid rca 2004  . Deep vein thrombosis ([facility])    Coumadin anticoagulation for deep vein thrombosis  . Greenfield filter in place    History of chronic lower extremity clots  . Hand arthritis   . Hyperlipidemia   . Hypertension     History of hypertension  . Hypotension   . Lightheadedness   . 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 Premature ventricular contractions   . Pulmonary embolism ([place])    05/2011    Past Surgical History:  Procedure Laterality Date  . CARDIAC CATHETERIZATION  10/22/2002   left heart cath  . EXCISON TOENAIL Right    [place]  . HIP SURGERY  2001-2001   right hip surgery  . ROTATOR CUFF REPAIR  1985   right rotator cuff surgery  . [place]   for a hernia    Family History  Problem Relation Age of Onset  . Tuberculosis Mother     Social History:  reports that he quit smoking about 49 years ago. His smoking use included Cigarettes. He has a 30.00 pack-year smoking history. He quit smokeless tobacco use about 33 years ago. He reports that he drinks alcohol. He reports that he does not use drugs.  Allergies:  Allergies  Allergen Reactions  . Lisinopril     Had hyperkalemia and worsening renal function    Medications: I have reviewed the patient's current medications.  Results for orders placed or performed during the hospital encounter of 01/10/2017 (from the past 48 hour(s))  CBC with Differential     Status: Abnormal   Collection Time: 02/05/2017 11:47 AM  Result Value Ref Range   WBC 3.3 (L) 4.0 - 10.5 K/uL   RBC 3.18 (L) 4.22 - 5.81 MIL/uL   Hemoglobin 9.1 (L) 13.0 - 17.0 g/dL   HCT 28.8 (L) 39.0 - 52.0 %   MCV 90.6 78.0 - 100.0 fL   MCH 28.6 26.0 - 34.0 pg   MCHC 31.6 30.0 - 36.0 g/dL   RDW 15.8 (H) 11.5 - 15.5 %   Platelets 204 150 - 400 K/uL   Neutrophils Relative % 68 %   Neutro Abs 2.2 1.7 - 7.7  K/uL   Lymphocytes Relative 19 %   Lymphs Abs 0.6 (L) 0.7 - 4.0 K/uL   Monocytes Relative 12 %   Monocytes Absolute 0.4 0.1 - 1.0 K/uL   Eosinophils Relative 1 %   Eosinophils Absolute 0.0 0.0 - 0.7 K/uL   Basophils Relative 0 %   Basophils Absolute 0.0 0.0 - 0.1 K/uL  Comprehensive metabolic panel     Status: Abnormal   Collection Time: 01/11/2017 11:47 AM  Result Value Ref Range   Sodium 130 (L) 135 - 145 mmol/L   Potassium 4.3 3.5 - 5.1 mmol/L   Chloride 101 101 - 111 mmol/L   CO2 22 22 - 32 mmol/L   Glucose, Bld 104 (H) 65 - 99 mg/dL   BUN 20 6 - 20 mg/dL   Creatinine, Ser 1.40 (H) 0.61 - 1.24 mg/dL   Calcium 8.3 (L) 8.9 - 10.3 mg/dL   Total Protein 6.3 (L) 6.5 - 8.1 g/dL   Albumin 2.4 (L) 3.5 - 5.0 g/dL   AST 28 15 - 41 U/L   ALT 17 17 - 63 U/L    Alkaline Phosphatase 64 38 - 126 U/L   Total Bilirubin 0.7 0.3 - 1.2 mg/dL   GFR calc non Af Amer 43 (L) >60 mL/min   GFR calc Af Amer 50 (L) >60 mL/min    Comment: (NOTE) The eGFR has been calculated using the CKD EPI equation. This calculation has not been validated in all clinical situations. eGFR's persistently <60 mL/min signify possible Chronic Kidney Disease.    Anion gap 7 5 - 15  Troponin I     Status: None   Collection Time: 01/20/2017 11:47 AM  Result Value Ref Range   Troponin I <0.03 <0.03 ng/mL  POC occult blood, ED Provider will collect     Status: None   Collection Time: 01/17/2017  3:13 PM  Result Value Ref Range   Fecal Occult Bld NEGATIVE NEGATIVE  Urinalysis, Routine w reflex microscopic     Status: None   Collection Time: 02/05/2017  3:17 PM  Result Value Ref Range   Color, Urine YELLOW YELLOW   APPearance CLEAR CLEAR   Specific Gravity, Urine 1.014 1.005 - 1.030   pH 5.0 5.0 - 8.0   Glucose, UA NEGATIVE NEGATIVE mg/dL   Hgb urine dipstick NEGATIVE NEGATIVE   Bilirubin Urine NEGATIVE NEGATIVE   Ketones, ur NEGATIVE NEGATIVE mg/dL   Protein, ur NEGATIVE NEGATIVE mg/dL   Nitrite NEGATIVE NEGATIVE   Leukocytes, UA NEGATIVE NEGATIVE  I-Stat CG4 Lactic Acid, ED     Status: None   Collection Time: 01/29/2017  5:07 PM  Result Value Ref Range   Lactic Acid, Venous 1.23 0.5 - 1.9 mmol/L  Procalcitonin     Status: None   Collection Time: 01/12/2017  5:40 PM  Result Value Ref Range   Procalcitonin 0.18 ng/mL    Comment:        Interpretation: PCT (Procalcitonin) <= 0.5 ng/mL: Systemic infection (sepsis) is not likely. Local bacterial infection is possible. (NOTE)         ICU PCT Algorithm               Non ICU PCT Algorithm    ----------------------------     ------------------------------         PCT < 0.25 ng/mL                 PCT < 0.1 ng/mL     Stopping of antibiotics  Stopping of antibiotics       strongly encouraged.               strongly  encouraged.    ----------------------------     ------------------------------       PCT level decrease by               PCT < 0.25 ng/mL       >= 80% from peak PCT       OR PCT 0.25 - 0.5 ng/mL          Stopping of antibiotics                                             encouraged.     Stopping of antibiotics           encouraged.    ----------------------------     ------------------------------       PCT level decrease by              PCT >= 0.25 ng/mL       < 80% from peak PCT        AND PCT >= 0.5 ng/mL            Continuin g antibiotics                                              encouraged.       Continuing antibiotics            encouraged.    ----------------------------     ------------------------------     PCT level increase compared          PCT > 0.5 ng/mL         with peak PCT AND          PCT >= 0.5 ng/mL             Escalation of antibiotics                                          strongly encouraged.      Escalation of antibiotics        strongly encouraged.   Protime-INR     Status: Abnormal   Collection Time: 01/20/2017  5:40 PM  Result Value Ref Range   Prothrombin Time 32.8 (H) 11.4 - 15.2 seconds   INR 3.12   APTT     Status: Abnormal   Collection Time: 01/15/2017  5:40 PM  Result Value Ref Range   aPTT 48 (H) 24 - 36 seconds    Comment:        IF BASELINE aPTT IS ELEVATED, SUGGEST PATIENT RISK ASSESSMENT BE USED TO DETERMINE APPROPRIATE ANTICOAGULANT THERAPY.   Iron and TIBC     Status: Abnormal   Collection Time: 01/13/2017  5:40 PM  Result Value Ref Range   Iron 28 (L) 45 - 182 ug/dL   TIBC 230 (L) 250 - 450 ug/dL   Saturation Ratios 12 (L) 17.9 - 39.5 %   UIBC 202 ug/dL  I-Stat arterial blood gas, ED     Status: Abnormal   Collection Time: 02/01/2017  5:54 PM  Result Value Ref Range   pH, Arterial 7.299 (L) 7.350 - 7.450   pCO2 arterial 32.6 32.0 - 48.0 mmHg   pO2, Arterial 66.0 (L) 83.0 - 108.0 mmHg   Bicarbonate 16.0 (L) 20.0 - 28.0 mmol/L    TCO2 17 0 - 100 mmol/L   O2 Saturation 91.0 %   Acid-base deficit 9.0 (H) 0.0 - 2.0 mmol/L   Patient temperature 98.6 F    Collection site RADIAL, ALLEN'S TEST ACCEPTABLE    Drawn by Operator    Sample type ARTERIAL     Dg Chest 2 View  Result Date: 01/28/2017 CLINICAL DATA:  Status post fall this morning. Right upper arm pain. EXAM: CHEST  2 VIEW COMPARISON:  Single-view of the chest 02/11/2016. CT chest 10/29/2016 and 06/02/2016. FINDINGS: The lungs are severely emphysematous. Opacity in the right upper lobe consistent with the patient's known mass appears worse than on the most recent comparison CT scan. There is a right pleural effusion and basilar airspace disease. Heart size is enlarged. IMPRESSION: Increase in the size of the patient's known right upper lobe mass. Right pleural effusion and basilar airspace disease which could be atelectasis or pneumonia. Severe emphysema. Electronically Signed   By: Inge Rise M.D.   On: 01/16/2017 12:29   Ct Head Wo Contrast  Result Date: 01/27/2017 CLINICAL DATA:  Status post fall.  Weakness. EXAM: CT HEAD WITHOUT CONTRAST TECHNIQUE: Contiguous axial images were obtained from the base of the skull through the vertex without intravenous contrast. COMPARISON:  Head CT, 02/11/2016 FINDINGS: Brain: No evidence of acute infarction, hemorrhage, hydrocephalus, extra-axial collection or mass lesion/mass effect. Partly calcified extra-axial mass adjacent to the anterior right temporal lobe is unchanged, consistent with a meningioma. There is a cyst in the right temporal lobe adjacent to the meningioma that has increased from the prior exam now measuring 2.6 x 2.0 cm transversely, previously 2.0 x 1.4 cm. It measures 2.7 cm from superior inferior. The ventricles are enlarged reflecting age appropriate volume loss. There is no hydrocephalus. Periventricular white matter hypoattenuation is stable consistent with mild chronic microvascular ischemic change.  Vascular: No hyperdense vessel or unexpected calcification. Skull: Normal. Negative for fracture or focal lesion. Sinuses/Orbits: Globes and orbits are unremarkable. The sinuses and mastoid air cells are clear. Other: None. IMPRESSION: 1. No acute intracranial abnormalities. 2. Stable right temporal region meningioma. Electronically Signed   By: Lajean Manes M.D.   On: 01/10/2017 13:09   Ct Angio Chest Pe W Or Wo Contrast  Result Date: 01/18/2017 CLINICAL DATA:  Pulmonary embolism. EXAM: CT ANGIOGRAPHY CHEST WITH CONTRAST TECHNIQUE: Multidetector CT imaging of the chest was performed using the standard protocol during bolus administration of intravenous contrast. Multiplanar CT image reconstructions and MIPs were obtained to evaluate the vascular anatomy. CONTRAST:  Contrast 80 cc Isovue 370 COMPARISON:  10/29/2016 FINDINGS: Cardiovascular: Heart is enlarged. Coronary artery calcification is noted. Atherosclerotic calcification is noted in the wall of the thoracic aorta. No filling defect in the opacified pulmonary arteries to suggest the presence of an acute pulmonary embolus. Mediastinum/Nodes: Mild mediastinal lymphadenopathy persists. 13 mm short axis precarinal lymph node measured previously is stable at 13 mm. 15 mm short axis right hilar lymph node measured previously is 14 mm today. 12 mm inferior right hilar lymph node on the prior study is 17 mm today. Small hiatal hernia. Esophagus unremarkable. There is no axillary lymphadenopathy. Lungs/Pleura: Emphysematous change again noted. Interstitium more prominent on the current study. The posterior right  upper lobe shows interval development of airspace disease around the large cavitary lesion containing the 2.8 cm nodule. Areas with a somewhat preserved lungs show interlobular septal thickening. Small bilateral pleural effusions are noted, right greater than left. Upper Abdomen: Unremarkable. Musculoskeletal: Bone windows reveal no worrisome lytic or  sclerotic osseous lesions. Review of the MIP images confirms the above findings. IMPRESSION: 1. No CT evidence for acute pulmonary embolus. 2. Interval development of substantial airspace disease posterior right upper lobe and central right lower lobe suggesting pneumonia. 3. Interval development of diffuse interlobular septal thickening with areas of ground-glass attenuation bilaterally. Superimposed pulmonary edema since. 4. Interval development of small bilateral pleural effusions, right greater than left. 5. Emphysema again noted with bronchial wall thickening and interstitial prominence at the bases raising the question of underlying interstitial lung disease. Electronically Signed   By: Misty Stanley M.D.   On: 02/07/2017 18:45   Dg Humerus Right  Result Date: 02/05/2017 CLINICAL DATA:  Mid right humerus pain following a fall at home this morning. EXAM: RIGHT HUMERUS - 2+ VIEW COMPARISON:  None. FINDINGS: Diffuse osteopenia. No fracture or dislocation. Mild right elbow degenerative changes. IMPRESSION: No fracture. Electronically Signed   By: Claudie Revering M.D.   On: 01/28/2017 12:30   Dg Hip Unilat W Or Wo Pelvis 2-3 Views Right  Result Date: 02/05/2017 CLINICAL DATA:  Fall.  Initial encounter. EXAM: DG HIP (WITH OR WITHOUT PELVIS) 2-3V RIGHT COMPARISON:  02/11/2016 FINDINGS: Sequelae of right hip arthroplasty are again identified. There is a new nondisplaced transversely oriented fracture through the lateral aspect of the femur at the level of the proximal femoral stem. There are additional less conspicuous curvilinear lucencies more proximally extending obliquely through the greater trochanter which may reflect extension of the nondisplaced fracture, however some of these are not clearly new from the prior study and could reflect vascular channels instead. No clear extension of the fracture medial to the prosthesis is identified. There is no lucency suggestive of loosening or infection about the  prosthesis. There is no dislocation. Heterotopic ossification is again seen lateral to the right hip. Limited evaluation of the left hip is without evidence of acute osseous abnormality. Prior lumbosacral fusion is noted. The bones are osteopenic. IMPRESSION: Nondisplaced fracture lateral to the proximal right femoral stem, with possible involvement of the greater trochanter as well. Electronically Signed   By: Logan Bores M.D.   On: 01/29/2017 14:53    I independently reviewed the pelvic x-rays and hip x-rays and agree that he has a proximal right femur fracture at the greater trochanteric area. The hemiarthroplasty appears well seated in the hip is located. This involves mainly the lateral cortex and does not appear to extend medially.  ROS Blood pressure (!) 120/92, pulse (!) 123, temperature 97 F (36.1 C), temperature source Axillary, resp. rate 18, height 5' 7"  (1.702 m), weight 151 lb (68.5 kg), SpO2 100 %. Physical Exam  Constitutional: He appears well-developed.  HENT:  Head: Normocephalic.  Eyes: Pupils are equal, round, and reactive to light.  Neck: Neck supple.  Cardiovascular: Tachycardia present.   Respiratory: He is in respiratory distress.  GI: Soft.  Musculoskeletal:       Right hip: He exhibits decreased range of motion, decreased strength, tenderness and bony tenderness.  Neurological: He is alert.    Assessment/Plan: Right periprosthetic proximal femur fracture  For now given his medical status and need to proceed with caution in terms of holding off on any surgical intervention.  Right now his other medical issues are taken the forefront. His INR is greater than 3 so of surgery and being necessary we would have to have that below 2 and potentially below 1.5 for spinal anesthesia. Right now the fracture remains nondisplaced so treatment for now will be non-weightbearing on the right lower extremity and mainly bed rest with transfers to a chair or wheelchair given his  significant fall risk. Certainly he may be too high of a medical risk to proceed with an operative intervention. I will likely review these x-rays with colleagues to get their opinion whether or not we should even consider surgery at all. I'll continue to follow him closely.  Mcarthur Rossetti 01/31/2017, 8:55 PM

## 2017-01-30 NOTE — Progress Notes (Signed)
Ortho static vital signs not taken in the ED and patient unable to stand to take at this time on the floor.

## 2017-01-30 NOTE — H&P (Signed)
History and Physical    Darrell Mendoza YKZ:[id number] DOB: Mar 27, 1925 DOA: 01/26/2017  PCP: Darrell Pao, MD  Patient coming from: home  Chief Complaint: Fall with right hip pain  HPI: Darrell Mendoza is a 83 y.o. 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 Patient said that this morning he tripped fell landing on his right side. Initially it was available to get up and ambulate back to his bed but later on as the day progressed he was unable to get up from the bed so he presented to the emergency department with complains of right sided hip and arm pain  Upon arrival to the ED his vital signs demonstrated hypoxia with oxygen saturation of 83% on room air but patient had no specific complaints of shortness of breath or chest pain.  ED Course: In his vital signs on presentation to the ED demonstrated a tachycardia with heart rate 129, and tachypnea with heart respirations of 29, blood pressure was normal and patient was afebrile. Laboratory demonstrated mild hyponatremia with sodium 130, creatinine 1.40 which is near his baseline, low albumin 2.4, leukopenia with white blood cells count 3,300, hemoglobin 9.1 g/dL which is a change in comparison to the last 12.9 on 02/13/2016 A right arm x-ray didn't reveal any fracture of the humerus Chest x-ray showed increased in the size of right upper lobe mass, right pleural effusion and basilar airspace disease which could be atelectasis or pneumonia  Head CT  didn't show acute intracranial abnormalities, revealed stable right temporal region meningioma Hip x-ray demonstrated nondisplaced fracture lateral to the proximal right femoral stem with possible involvement of the greater trochanteric  Review of Systems: As per HPI otherwise 10 point review of systems negative.   Ambulatory Status: Independent prior to admission  Past Medical History:  Diagnosis Date  . Atrial fibrillation ([place]) 12/11/2015  . Benign prostatic hypertrophy   . Bilateral cataracts   . CKD (chronic kidney disease) stage 3, GFR 30-59 ml/min   . Coronary artery disease    s/p 3.0x23 cypher mid rca 2004  . Deep vein thrombosis (HCC)    Coumadin anticoagulation for deep vein thrombosis  . Greenfield filter in place    History of chronic lower extremity clots  . Hand arthritis   . Hyperlipidemia   . Hypertension     History of hypertension  . Hypotension   . Lightheadedness   . 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 Premature ventricular contractions   . Pulmonary embolism ([place])    05/2011    Past Surgical History:  Procedure Laterality Date  . CARDIAC CATHETERIZATION  10/22/2002   left heart cath  . EXCISON TOENAIL Right    HALLUX  . HIP SURGERY  2001-2001   right hip surgery  . ROTATOR CUFF REPAIR  1985   right rotator cuff surgery  . [place]   for a hernia    Social History   Social History  . Marital status: Married    Spouse name: N/A  . Number of children: 2  . Years of education: N/A  Occupational History  . factory  Retired    retired   Social History Main Topics  . Smoking status: Former Smoker    Packs/day: 1.00    Years: 30.00    Types: Cigarettes    Quit date: 10/12/1967  . Smokeless tobacco: Former Systems developer    Quit date: 10/20/1983  . Alcohol use 0.0 oz/week     Comment: very little  . Drug use: No  . Sexual activity: Not on file   Other Topics Concern  . Not on file   Social History Narrative  . No narrative  on file    Allergies  Allergen Reactions  . Lisinopril     Had hyperkalemia and worsening renal function    Family History  Problem Relation Age of Onset  . Tuberculosis Mother     Prior to Admission medications   Medication Sig Start Date End Date Taking? Authorizing Provider  calcium-vitamin D (OSCAL WITH D) 250-125 MG-UNIT per tablet Take 1 tablet by mouth daily.    Historical Provider, MD  carvedilol (COREG) 3.125 MG tablet TAKE 1 TABLET BY MOUTH TWICE A DAY 11/01/16   Darrell M Martinique, MD  finasteride (PROSCAR) 5 MG tablet Take 5 mg by mouth daily. 12/26/15   Historical Provider, MD  fish oil-omega-3 fatty acids 1000 MG capsule Take 2 g by mouth daily.    Historical Provider, MD  furosemide (LASIX) 20 MG tablet Take 1 tablet by mouth daily. 07/16/16   Historical Provider, MD  HYDROcodone-acetaminophen (NORCO/VICODIN) 5-325 MG tablet Take 1 tablet by mouth every 6 (six) hours as needed. 03/19/16   Historical Provider, MD  Multiple Vitamin (MULTIVITAMIN) tablet Take 1 tablet by mouth daily.    Historical Provider, MD  pravastatin (PRAVACHOL) 40 MG tablet Take 40 mg by mouth daily.    Historical Provider, MD  Tamsulosin HCl (FLOMAX) 0.4 MG CAPS Take 0.4 mg by mouth daily.    Historical Provider, MD  vitamin D, CHOLECALCIFEROL, 400 UNITS tablet Take 400 Units by mouth daily.    Historical Provider, MD  warfarin (COUMADIN) 5 MG tablet Take 0.5-1 tablets (2.5-5 mg total) by mouth daily. Take 2.5 mg Sun / Tues / Wed / Thurs / Sat Take 5 mg on Mon / Fri 02/16/16   Darrell Dumas, MD    Physical Exam: Vitals:   01/12/2017 1400 01/16/2017 1545 01/14/2017 1600 01/29/2017 1615  BP: (!) 120/91 126/83 113/71 110/77  Pulse: (!) 118 (!) 121 98 97  Resp: (!) 22 20 (!) 29 (!) 21  Temp:      TempSrc:      SpO2: 93% 97% 96% 97%  Weight:      Height:         General: Pale, ill looking elderly male  Eyes: PERRLA, EOMI, normal lids, iris ENT:  grossly normal hearing, lips & tongue, mucous membranes moist  and intact Neck: no lymphoadenopathy, masses or thyromegaly Cardiovascular: RRR, no m/r/g. No JVD, carotid bruits. No LE edema.  Respiratory: bilateral wheezes, R>L and right sided rhinchi. Normal respiratory effort. No accessory muscle use observed Abdomen: soft, non-tender, non-distended, no organomegaly or masses appreciated. BS present in all quadrants Skin: no rash, ulcers or induration seen on limited exam Musculoskeletal: grossly normal tone BUE/BLE, good ROM, no bony abnormality or joint deformities observed Psychiatric: grossly normal mood and affect, speech fluent and appropriate, alert and oriented x3 Neurologic: CN II-XII grossly intact, moves all extremities in coordinated fashion, sensation intact  Labs on Admission: I have personally reviewed  following labs and imaging studies  CBC, BMP  GFR: Estimated Creatinine Clearance: 33.4 mL/min (A) (by C-G formula based on SCr of 1.4 mg/dL (H)).   Creatinine Clearance: Estimated Creatinine Clearance: 33.4 mL/min (A) (by C-G formula based on SCr of 1.4 mg/dL (H)).    Radiological Exams on Admission: Dg Chest 2 View  Result Date: 01/31/2017 CLINICAL DATA:  Status post fall this morning. Right upper arm pain. EXAM: CHEST  2 VIEW COMPARISON:  Single-view of the chest 02/11/2016. CT chest 10/29/2016 and 06/02/2016. FINDINGS: The lungs are severely emphysematous. Opacity in the right upper lobe consistent with the patient's known mass appears worse than on the most recent comparison CT scan. There is a right pleural effusion and basilar airspace disease. Heart size is enlarged. IMPRESSION: Increase in the size of the patient's known right upper lobe mass. Right pleural effusion and basilar airspace disease which could be atelectasis or pneumonia. Severe emphysema. Electronically Signed   By: Inge Rise M.D.   On: 01/11/2017 12:29   Ct Head Wo Contrast  Result Date: 01/20/2017 CLINICAL DATA:  Status post fall.  Weakness. EXAM: CT  HEAD WITHOUT CONTRAST TECHNIQUE: Contiguous axial images were obtained from the base of the skull through the vertex without intravenous contrast. COMPARISON:  Head CT, 02/11/2016 FINDINGS: Brain: No evidence of acute infarction, hemorrhage, hydrocephalus, extra-axial collection or mass lesion/mass effect. Partly calcified extra-axial mass adjacent to the anterior right temporal lobe is unchanged, consistent with a meningioma. There is a cyst in the right temporal lobe adjacent to the meningioma that has increased from the prior exam now measuring 2.6 x 2.0 cm transversely, previously 2.0 x 1.4 cm. It measures 2.7 cm from superior inferior. The ventricles are enlarged reflecting age appropriate volume loss. There is no hydrocephalus. Periventricular white matter hypoattenuation is stable consistent with mild chronic microvascular ischemic change. Vascular: No hyperdense vessel or unexpected calcification. Skull: Normal. Negative for fracture or focal lesion. Sinuses/Orbits: Globes and orbits are unremarkable. The sinuses and mastoid air cells are clear. Other: None. IMPRESSION: 1. No acute intracranial abnormalities. 2. Stable right temporal region meningioma. Electronically Signed   By: Lajean Manes M.D.   On: 02/02/2017 13:09   Dg Humerus Right  Result Date: 02/05/2017 CLINICAL DATA:  Mid right humerus pain following a fall at home this morning. EXAM: RIGHT HUMERUS - 2+ VIEW COMPARISON:  None. FINDINGS: Diffuse osteopenia. No fracture or dislocation. Mild right elbow degenerative changes. IMPRESSION: No fracture. Electronically Signed   By: Claudie Revering M.D.   On: 01/22/2017 12:30   Dg Hip Unilat W Or Wo Pelvis 2-3 Views Right  Result Date: 01/31/2017 CLINICAL DATA:  Fall.  Initial encounter. EXAM: DG HIP (WITH OR WITHOUT PELVIS) 2-3V RIGHT COMPARISON:  02/11/2016 FINDINGS: Sequelae of right hip arthroplasty are again identified. There is a new nondisplaced transversely oriented fracture through the  lateral aspect of the femur at the level of the proximal femoral stem. There are additional less conspicuous curvilinear lucencies more proximally extending obliquely through the greater trochanter which may reflect extension of the nondisplaced fracture, however some of these are not clearly new from the prior study and could reflect vascular channels instead. No clear extension of the fracture medial to the prosthesis is identified. There is no lucency suggestive of loosening or infection about the prosthesis. There is no dislocation. Heterotopic ossification is again seen lateral to the right hip. Limited evaluation of the left hip is without evidence of acute osseous abnormality. Prior lumbosacral fusion is  noted. The bones are osteopenic. IMPRESSION: Nondisplaced fracture lateral to the proximal right femoral stem, with possible involvement of the greater trochanter as well. Electronically Signed   By: Logan Bores M.D.   On: 01/22/2017 14:53    EKG: Independently reviewed - atrial fibrillation with occasional PVCs, no acute ischemic T-wave changes.  Assessment/Plan Principal Problem:   Severe sepsis with septic shock [facility]) Active Problems:   Coronary artery disease   Atrial fibrillation (HCC)   Chronic systolic CHF (congestive heart failure) (HCC)   Acute respiratory failure with hypoxia (HCC)   Malignant neoplasm of right upper lobe of lung (HCC)   Hip fracture (HCC)   Obstructive pneumonia    Severe sepsis with septic shock-apparently associated with pneumonia The patient received a bolus 1 L normal saline, continue normal saline infusion-he requires total of 2. 2.250 mL of fluids according to his weight, but need to exercise caution given his low LV ejection fraction Lactic acid and Procalcitonin are in process  Acute respiratory distress with hypoxia associated with pneumonia, suspect bacterial and maybe obstructed given the large lung mass on the ipsilateral side Initiate  broad-spectrum IV antibiotic therapy-vancomycin and cefepime per pharmacy Continue nebulizer therapy, supplemental oxygen, if wheezing persists, might need steroid IV therapy  Right hip fracture - ortho, Dr. Ninfa Linden was contacted by EDP  Chronic systolic CHF - at home on Coreg, which will be continued with parameters to hold for low BP Hold lasix as he looks volume depleted, but given multiple IV fluid boluses patient might need to be on IV diuretic therapy  Continue to monitor daily weight and I/O  Lng carcinoma - currently patient is receiving palliative radiation therapy He is not a good surgical candidate  History of DVT and PE - patient is on coumadin anticoagulation therapy which will be managed by pharmacy. PT/INR is in process  CAD with history of PCI and Afib  Currently he is angina free, heart rate varies between 80's and 120's Continue to monitor, continue Coreg with parameters to hold  Anemia - rectal exam performed by EDP was negative for blood; could be associated with CKD, malignancy, iron deficiency Will add anemia panel  CKD stage III - creatinine is near baseline  DVT prophylaxis: Coumadin/heparin bridge Code Status: DNR Family Communication: none - VM Left on AM at home Disposition Plan: [place] called: Ortho called by EDP  Admission status: Inpatient    York Grice, [place] Pager: [phone] [facility]  If 7PM-7AM, please contact night-coverage [email or web address] Password [facility]  01/14/2017, 5:35 PM

## 2017-01-30 NOTE — ED Notes (Signed)
Pt O2 sat decreased to 83% with perfect wave-form.  O2 applied via nasal cannula with sat increasing to 93%

## 2017-01-30 NOTE — Progress Notes (Signed)
Patient ID: Darrell Mendoza, male   DOB: 06-17-1925, 83 y.o.   MRN: [id number] I have reviewed the patient's x-rays and see that he has a right periprosthetic fracture of the proximal femur near a right hip hemiarthroplasty.  I will see him at some point today for an examination.  Right now, I do not anticipate any surgery.  Would have him on bed rest for now or at a minimum, non-weight bearing on the right hip until further evaluation.

## 2017-01-30 NOTE — ED Notes (Signed)
Collected 2nd set of blood cultures after antiobiotics given.

## 2017-01-30 NOTE — Progress Notes (Signed)
Pharmacy Antibiotic Note  Darrell Mendoza is a 83 y.o. male admitted on 01/09/2017 with pneumonia.  Pharmacy has been consulted for vancomycin/cefepime dosing. Received 1x doses of ceftriaxone/azithromycin in the ED. Afebrile, WBC 3.3. SCr 1.4 on admit, CrCl~33.  Plan: Cefepime 2g IV q24h Vancomycin '1500mg'$  IV x1; then 1g IV q24h Monitor clinical progress, c/s, renal function F/u de-escalation plan/LOT, vancomycin trough as indicated   Height: '5\' 7"'$  (170.2 cm) Weight: 151 lb (68.5 kg) IBW/kg (Calculated) : 66.1  Temp (24hrs), Avg:97.8 F (36.6 C), Min:97.8 F (36.6 C), Max:97.8 F (36.6 C)   Recent Labs Lab 01/18/2017 1147  WBC 3.3*  CREATININE 1.40*    Estimated Creatinine Clearance: 33.4 mL/min (A) (by C-G formula based on SCr of 1.4 mg/dL (H)).    Allergies  Allergen Reactions  . Lisinopril     Had hyperkalemia and worsening renal function    Antimicrobials this admission: 4/22 vancomycin >>  4/22 cefepime >>  4/22 ceftriaxone x 1 4/22 azithromycin x 1  Dose adjustments this admission:   Microbiology results:   Elicia Lamp, PharmD, BCPS Clinical Pharmacist 02/01/2017 5:05 PM

## 2017-01-30 NOTE — ED Triage Notes (Signed)
Pt presents with R hip and R upper arm pain after tripping early this morning and falling.  Pt reports falling onto R side, denies LOC.  Pt able to ambulate back to bed but was unable to get up this morning.

## 2017-01-31 LAB — PROTIME-INR
INR: 3.06
Prothrombin Time: 32.3 seconds — ABNORMAL HIGH (ref 11.4–15.2)

## 2017-01-31 LAB — RETICULOCYTES
RBC.: 3.28 MIL/uL — AB (ref 4.22–5.81)
RETIC CT PCT: 2 % (ref 0.4–3.1)
Retic Count, Absolute: 65.6 10*3/uL (ref 19.0–186.0)

## 2017-01-31 LAB — BASIC METABOLIC PANEL
Anion gap: 5 (ref 5–15)
BUN: 19 mg/dL (ref 6–20)
CHLORIDE: 105 mmol/L (ref 101–111)
CO2: 23 mmol/L (ref 22–32)
Calcium: 8.1 mg/dL — ABNORMAL LOW (ref 8.9–10.3)
Creatinine, Ser: 1.27 mg/dL — ABNORMAL HIGH (ref 0.61–1.24)
GFR calc Af Amer: 56 mL/min — ABNORMAL LOW (ref >60)
GFR calc non Af Amer: 48 mL/min — ABNORMAL LOW (ref >60)
GLUCOSE: 103 mg/dL — AB (ref 65–99)
POTASSIUM: 4.5 mmol/L (ref 3.5–5.1)
Sodium: 133 mmol/L — ABNORMAL LOW (ref 135–145)

## 2017-01-31 LAB — CBC
HEMATOCRIT: 30.3 % — AB (ref 39.0–52.0)
HEMOGLOBIN: 9.4 g/dL — AB (ref 13.0–17.0)
MCH: 28.7 pg (ref 26.0–34.0)
MCHC: 31 g/dL (ref 30.0–36.0)
MCV: 92.4 fL (ref 78.0–100.0)
Platelets: 216 10*3/uL (ref 150–400)
RBC: 3.28 MIL/uL — ABNORMAL LOW (ref 4.22–5.81)
RDW: 16.2 % — ABNORMAL HIGH (ref 11.5–15.5)
WBC: 3.6 10*3/uL — AB (ref 4.0–10.5)

## 2017-01-31 LAB — VITAMIN B12: Vitamin B-12: 1226 pg/mL — ABNORMAL HIGH (ref 180–914)

## 2017-01-31 MED ORDER — OMEGA-3-ACID ETHYL ESTERS 1 G PO CAPS
1.0000 g | ORAL_CAPSULE | Freq: Every day | ORAL | Status: DC
  Administered 2017-01-31 – 2017-02-02 (×3): 1 g via ORAL
  Filled 2017-01-31 (×3): qty 1

## 2017-01-31 MED ORDER — SODIUM CHLORIDE 0.9 % IV SOLN
INTRAVENOUS | Status: DC
Start: 2017-01-31 — End: 2017-02-03
  Administered 2017-01-31 – 2017-02-02 (×3): via INTRAVENOUS

## 2017-01-31 MED ORDER — SODIUM CHLORIDE 0.9 % IV BOLUS (SEPSIS)
500.0000 mL | Freq: Once | INTRAVENOUS | Status: AC
  Administered 2017-01-31: 500 mL via INTRAVENOUS

## 2017-01-31 MED ORDER — LEVALBUTEROL HCL 0.63 MG/3ML IN NEBU: 0.6300 mg | INHALATION_SOLUTION | RESPIRATORY_TRACT | Status: DC | PRN

## 2017-01-31 MED ORDER — SODIUM CHLORIDE 0.9 % IV BOLUS (SEPSIS)
250.0000 mL | Freq: Once | INTRAVENOUS | Status: AC
  Administered 2017-01-31: 250 mL via INTRAVENOUS

## 2017-01-31 MED ORDER — WARFARIN SODIUM 1 MG PO TABS
1.0000 mg | ORAL_TABLET | Freq: Once | ORAL | Status: AC
  Administered 2017-01-31: 1 mg via ORAL
  Filled 2017-01-31: qty 1

## 2017-01-31 MED ORDER — ENSURE ENLIVE PO LIQD
237.0000 mL | Freq: Two times a day (BID) | ORAL | Status: DC
  Administered 2017-02-01 – 2017-02-02 (×3): 237 mL via ORAL

## 2017-01-31 NOTE — Progress Notes (Signed)
[place] for warfarin Indication: atrial fibrillation   Assessment: 89 yom on warfarin PTA for afib, also hx DVT/PE, presents s/p fall with hip fracture - per ortho note, surgery not anticipated at this time. CT head negative. Pharmacy consulted to dose while inpatient. INR remains at upper end of desired range at 3.06 following low dose last evening. Hg 9.4, plt wnl. No bleed reported.   PTA warfarin dose: 2.'5mg'$  PO daily (last dose 4/21 PTA)  Goal of Therapy:  INR 2-3 Monitor platelets by anticoagulation protocol: Yes    Plan:  1. Warfarin '1mg'$  PO x 1 dose 2. Daily INR 3. Monitor CBC, s/sx bleeding 4. Follow up on orhto plans and hold warfarin if surgery anticipated  Vincenza Hews, PharmD, BCPS 01/31/2017, 1:45 PM

## 2017-01-31 NOTE — Progress Notes (Signed)
[place] TEAM 1 - Stepdown/ICU TEAM  Tommey Barret  EGB:[id number] DOB: 1925/04/12 DOA: 01/31/2017 PCP: Haywood Pao, MD    Brief Narrative:  83 year old male with a history of PAF, chronic systolic heart failure with EF of 30-35% with diffuse hypokinesis, Stage 3 lung cancer on palliative radiation, 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 On arrival to ED X-ray noted a right displaced femoral fracture, and the patient was hypotensive to the 70s-80s.   Subjective: Patient is lethargic.  He is tachycardic and hypertensive.  He is not able to provide a hx.  I spoke to his wife and son at bedside at length.    Assessment & Plan:  Sepsis due to RUL and RML pneumonia - acute hypoxic resp failure  Cont empiric abx tx - volume expand - still at risk for acute decline   R periprosthetic non-displaced proximal femur fracture Ortho following - bedrest for now - not yet clear if surgery will be necessary, or possible   Chronic systolic CHF EF 37-10% via TTE March 2017 - baseline wgt appears to be ~71kg - clinically appears volume depleted - continue to follow weights and fluid balance - volume expand  Filed Weights   01/22/2017 1120  Weight: 68.5 kg (151 lb)    Lung carcinoma currently receiving palliative radiation therapy  History of DVT and PE on coumadin anticoagulation  CAD with history of PCI  No evidence of ACS at this time  Afib  Current RVR likely driven by volume depletion - volume expand him follow  Anemia rectal exam performed by EDP was negative for blood - likely due to lung cancer and poor nutritional status  CKD stage III Creatinine improving with volume expansion  Recent Labs Lab 01/24/2017 1147 01/31/17 0410  CREATININE 1.40* 1.27*    DVT prophylaxis: warfarin Code Status: DNR - NO CODE Family Communication:  Spoke with wife and son at bedside Disposition Plan: SDU  Consultants:  Orthopedics  Procedures: none  Antimicrobials:  Azithromycin 4/22 > cefepime 4/22 > Vancomycin 4/22 > Rocephin 4/22  Objective: Blood pressure 114/79, pulse 68, temperature 98.2 F (36.8 C), temperature source Oral, resp. rate 15, height '5\' 7"'$  (1.702 m), weight 68.5 kg (151 lb), SpO2 96 %.  Intake/Output Summary (Last 24 hours) at 01/31/17 1412 Last data filed at 01/31/17 0727  Gross per 24 hour  Intake             1420 ml  Output             1200 ml  Net              220 ml   Filed Weights   01/09/2017 1120  Weight: 68.5 kg (151 lb)    Examination: General: No acute respiratory distress evident - lethargic  Lungs: Poor air movement right upper fields and bilateral bases with no wheezing Cardiovascular: Irregularly irregular and tachycardic without appreciable murmur or rub Abdomen: Nontender, nondistended, soft, bowel sounds positive, no rebound, no ascites, no appreciable mass Extremities: No significant cyanosis, clubbing, or edema bilateral lower extremities  CBC:  Recent Labs Lab 01/22/2017 1147 01/31/17 0410  WBC 3.3* 3.6*  NEUTROABS 2.2  --   HGB 9.1* 9.4*  HCT 28.8* 30.3*  MCV 90.6 92.4  PLT 204 626   Basic Metabolic Panel:  Recent Labs Lab 01/19/2017 1147 01/31/17 0410  NA 130* 133*  K 4.3 4.5  CL 101 105  CO2 22 23  GLUCOSE 104* 103*  BUN 20 19  CREATININE 1.40* 1.27*  CALCIUM 8.3* 8.1*   GFR: Estimated Creatinine Clearance: 36.9 mL/min (A) (by C-G formula based on SCr of 1.27 mg/dL (H)).  Liver Function Tests:  Recent Labs Lab 01/31/2017 1147  AST 28  ALT 17  ALKPHOS 64  BILITOT 0.7  PROT 6.3*  ALBUMIN 2.4*    Coagulation Profile:  Recent Labs Lab 02/04/2017 1740 01/31/17 0410  INR 3.12 3.06    Cardiac Enzymes:  Recent Labs Lab 01/10/2017 1147  TROPONINI <0.03    Recent Results (from the past 240 hour(s))  MRSA PCR Screening     Status: None    Collection Time: 01/31/2017  9:56 PM  Result Value Ref Range Status   MRSA by PCR NEGATIVE NEGATIVE Final    Comment:        The GeneXpert MRSA Assay (FDA approved for NASAL specimens only), is one component of a comprehensive MRSA colonization surveillance program. It is not intended to diagnose MRSA infection nor to guide or monitor treatment for MRSA infections.      Scheduled Meds: . calcium-vitamin D  1 tablet Oral Daily  . carvedilol  3.125 mg Oral BID  . cholecalciferol  1,000 Units Oral Daily  . furosemide  20 mg Oral Daily  . ipratropium-albuterol  3 mL Nebulization TID  . mouth rinse  15 mL Mouth Rinse BID  . omega-3 acid ethyl esters  1 g Oral Daily  . pravastatin  40 mg Oral Daily  . sodium chloride flush  3 mL Intravenous Q12H  . tamsulosin  0.4 mg Oral Daily  . warfarin  1 mg Oral ONCE-1800  . Warfarin - Pharmacist Dosing Inpatient   Does not apply q1800    LOS: 1 day   Cherene Altes, MD [facility] Office  [phone] Pager - Text Page per Amion as per below:  On-Call/Text Page:      [email or web address]      password [email or web address]  If 7PM-7AM, please contact night-coverage [email or web address] Password [email or web address] 01/31/2017, 2:12 PM

## 2017-01-31 NOTE — Care Management Note (Addendum)
Case Management Note  Patient Details  Name: Darrell Mendoza MRN: [id number] Date of Birth: May 31, 1925  Subjective/Objective:  From home with wife, presents with right displaced femoral fracture, hypotensive to the 70s-80s, Sepsis due to pna, hypoxic resp failure, chf, lung cancer( currently receiving palliative radiation therapy), afib, anemia, ckd.   NCM referral , wife needs more help at home,  Pt eval is rec SNF.  CSW  Following for placement.     4/25 [place], BSN - Palliative met with family, and the decision has now been made to transition to full comfort care.                 Action/Plan: NCM will follow for dc needs.  Expected Discharge Date:  02/02/17               Expected Discharge Plan:     In-House Referral:     Discharge planning Services  CM Consult  Post Acute Care Choice:    Choice offered to:     DME Arranged:    DME Agency:     HH Arranged:    HH Agency:     Status of Service:  In process, will continue to follow  If discussed at Long Length of Stay Meetings, dates discussed:    Additional Comments:  Zenon Mayo, RN 01/31/2017, 6:25 PM

## 2017-02-01 DIAGNOSIS — Z66 Do not resuscitate: Secondary | ICD-10-CM

## 2017-02-01 DIAGNOSIS — J9601 Acute respiratory failure with hypoxia: Secondary | ICD-10-CM

## 2017-02-01 DIAGNOSIS — D500 Iron deficiency anemia secondary to blood loss (chronic): Secondary | ICD-10-CM

## 2017-02-01 DIAGNOSIS — Z515 Encounter for palliative care: Secondary | ICD-10-CM

## 2017-02-01 LAB — CBC
HCT: 25.6 % — ABNORMAL LOW (ref 39.0–52.0)
Hemoglobin: 8.1 g/dL — ABNORMAL LOW (ref 13.0–17.0)
MCH: 28.7 pg (ref 26.0–34.0)
MCHC: 31.6 g/dL (ref 30.0–36.0)
MCV: 90.8 fL (ref 78.0–100.0)
Platelets: 203 10*3/uL (ref 150–400)
RBC: 2.82 MIL/uL — ABNORMAL LOW (ref 4.22–5.81)
RDW: 16.2 % — AB (ref 11.5–15.5)
WBC: 4.2 10*3/uL (ref 4.0–10.5)

## 2017-02-01 LAB — COMPREHENSIVE METABOLIC PANEL
ALT: 60 U/L (ref 17–63)
AST: 58 U/L — AB (ref 15–41)
Albumin: 2 g/dL — ABNORMAL LOW (ref 3.5–5.0)
Alkaline Phosphatase: 90 U/L (ref 38–126)
Anion gap: 8 (ref 5–15)
BILIRUBIN TOTAL: 0.5 mg/dL (ref 0.3–1.2)
BUN: 23 mg/dL — AB (ref 6–20)
CALCIUM: 8.1 mg/dL — AB (ref 8.9–10.3)
CO2: 23 mmol/L (ref 22–32)
CREATININE: 1.23 mg/dL (ref 0.61–1.24)
Chloride: 103 mmol/L (ref 101–111)
GFR calc Af Amer: 58 mL/min — ABNORMAL LOW (ref >60)
GFR, EST NON AFRICAN AMERICAN: 50 mL/min — AB (ref >60)
Glucose, Bld: 107 mg/dL — ABNORMAL HIGH (ref 65–99)
POTASSIUM: 4.1 mmol/L (ref 3.5–5.1)
Sodium: 134 mmol/L — ABNORMAL LOW (ref 135–145)
TOTAL PROTEIN: 5.8 g/dL — AB (ref 6.5–8.1)

## 2017-02-01 LAB — PROTIME-INR
INR: 3.36
PROTHROMBIN TIME: 34.8 s — AB (ref 11.4–15.2)

## 2017-02-01 MED ORDER — OXYCODONE-ACETAMINOPHEN 5-325 MG PO TABS
2.0000 | ORAL_TABLET | Freq: Once | ORAL | Status: AC
  Administered 2017-02-01: 2 via ORAL
  Filled 2017-02-01: qty 2

## 2017-02-01 MED ORDER — OXYCODONE-ACETAMINOPHEN 5-325 MG PO TABS
1.0000 | ORAL_TABLET | ORAL | Status: DC | PRN
Start: 2017-02-01 — End: 2017-02-02

## 2017-02-01 MED ORDER — VANCOMYCIN HCL IN DEXTROSE 1-5 GM/200ML-% IV SOLN
1000.0000 mg | INTRAVENOUS | Status: DC
  Administered 2017-02-01: 1000 mg via INTRAVENOUS
  Filled 2017-02-01 (×2): qty 200

## 2017-02-01 NOTE — Plan of Care (Signed)
Problem: Fluid Volume: Goal: Hemodynamic stability will improve Outcome: Progressing Patient received Fluid boluses during day shift; however, patient did not require more boluses on night shift. BP maintained.   Problem: Respiratory: Goal: Ability to maintain adequate ventilation will improve Outcome: Progressing Patient O2 saturation maintained on nonrebreather overnight; however, patient requires frequent reminders/ redirecting to leave mask on. Removed several times an hour.

## 2017-02-01 NOTE — Progress Notes (Signed)
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 On arrival to ED X-ray noted a right displaced femoral fracture, and the patient was hypotensive to the 70s-80s.   Subjective: Continues to be tachycardic, hypotensive, currently on high flow oxygen 15 L,lethargic   Assessment & Plan:  Sepsis due to RUL and RML pneumonia - acute hypoxic resp failure  Cont empiric abx tx - volume expand - still at risk for acute decline , will involve palliative care sooner than later. CT chest no PE. Interval development of right upper lobe and central right lower lobe pneumonia. On cefepime ,add vanc   R periprosthetic non-displaced proximal femur fracture Ortho following -deemed not to be a surgical candidate - follow-up in 4-6 weeks  Chronic systolic CHF EF 23-53% via TTE March 2017 - baseline wgt appears to be ~71kg - clinically appears volume depleted - continue to follow weights and fluid balance - volume expand  Filed Weights   02/01/2017 1120 02/01/17 0439  Weight: 68.5 kg (151 lb) 75.2 kg (165 lb 12.6 oz)    Lung carcinoma currently receiving palliative radiation therapy  History of DVT and PE on coumadin anticoagulation  CAD with history of PCI  No evidence of ACS at this time  Afib  Current RVR likely driven by volume depletion - volume expand him follow. On Coumadin  Anemia-hemoglobin 9.1 down to 8.1 rectal exam performed by EDP was negative for blood - likely due to lung cancer and poor nutritional status Will transfuse if hemoglobin drops  further  CKD stage III Creatinine improving with volume expansion  Recent Labs Lab 01/16/2017 1147 01/31/17 0410 02/01/17 0429  CREATININE 1.40* 1.27* 1.23    DVT prophylaxis: warfarin Code Status: DNR - NO CODE Family Communication: Spoke with wife and son at bedside Disposition Plan: SDU, palliative care consult  Consultants:  Orthopedics Palliative care  Procedures: none  Antimicrobials:  Azithromycin 4/22 > cefepime 4/22 > Vancomycin 4/22 > Rocephin 4/22  Objective: Blood pressure 115/60, pulse (!) 105, temperature 97.8 F (36.6 C), temperature source Axillary, resp. rate (!) 26, height '5\' 7"'$  (1.702 m), weight 75.2 kg (165 lb 12.6 oz), SpO2 (!) 84 %.  Intake/Output Summary (Last 24 hours) at 02/01/17 1042 Last data filed at 02/01/17 6144  Gross per 24 hour  Intake             2455 ml  Output             1400 ml  Net             1055 ml   Filed Weights   01/19/2017 1120 02/01/17 0439  Weight: 68.5 kg (151 lb) 75.2 kg (165 lb 12.6 oz)    Examination: General: No acute respiratory distress evident - lethargic  Lungs: Poor air movement right upper fields and bilateral bases with no wheezing Cardiovascular: Irregularly irregular and tachycardic without appreciable murmur or rub Abdomen: Nontender, nondistended, soft, bowel sounds positive, no rebound, no ascites, no appreciable mass Extremities: No significant cyanosis, clubbing, or edema bilateral lower extremities  CBC:  Recent Labs Lab 01/21/2017 1147  01/31/17 0410 02/01/17 0429  WBC 3.3* 3.6* 4.2  NEUTROABS 2.2  --   --   HGB 9.1* 9.4* 8.1*  HCT 28.8* 30.3* 25.6*  MCV 90.6 92.4 90.8  PLT 204 216 654   Basic Metabolic Panel:  Recent Labs Lab 02/04/2017 1147 01/31/17 0410 02/01/17 0429  NA 130* 133* 134*  K 4.3 4.5 4.1  CL 101 105 103  CO2 '22 23 23  '$ GLUCOSE 104* 103* 107*  BUN 20 19 23*  CREATININE 1.40* 1.27* 1.23  CALCIUM 8.3* 8.1* 8.1*   GFR: Estimated Creatinine Clearance: 38.1 mL/min  (by C-G formula based on SCr of 1.23 mg/dL).  Liver Function Tests:  Recent Labs Lab 01/29/2017 1147 02/01/17 0429  AST 28 58*  ALT 17 60  ALKPHOS 64 90  BILITOT 0.7 0.5  PROT 6.3* 5.8*  ALBUMIN 2.4* 2.0*    Coagulation Profile:  Recent Labs Lab 01/16/2017 1740 01/31/17 0410 02/01/17 0429  INR 3.12 3.06 3.36    Cardiac Enzymes:  Recent Labs Lab 02/04/2017 1147  TROPONINI <0.03    Recent Results (from the past 240 hour(s))  Culture, blood (x 2)     Status: None (Preliminary result)   Collection Time: 01/15/2017  5:40 PM  Result Value Ref Range Status   Specimen Description BLOOD LEFT FOREARM  Final   Special Requests IN PEDIATRIC BOTTLE Blood Culture adequate volume  Final   Culture NO GROWTH < 24 HOURS  Final   Report Status PENDING  Incomplete  Culture, blood (x 2)     Status: None (Preliminary result)   Collection Time: 02/07/2017  7:45 PM  Result Value Ref Range Status   Specimen Description BLOOD RIGHT HAND  Final   Special Requests   Final    BOTTLES DRAWN AEROBIC ONLY Blood Culture adequate volume   Culture NO GROWTH < 24 HOURS  Final   Report Status PENDING  Incomplete  MRSA PCR Screening     Status: None   Collection Time: 01/23/2017  9:56 PM  Result Value Ref Range Status   MRSA by PCR NEGATIVE NEGATIVE Final    Comment:        The GeneXpert MRSA Assay (FDA approved for NASAL specimens only), is one component of a comprehensive MRSA colonization surveillance program. It is not intended to diagnose MRSA infection nor to guide or monitor treatment for MRSA infections.      Scheduled Meds: . calcium-vitamin D  1 tablet Oral Daily  . carvedilol  3.125 mg Oral BID  . cholecalciferol  1,000 Units Oral Daily  . feeding supplement (ENSURE ENLIVE)  237 mL Oral BID BM  . mouth rinse  15 mL Mouth Rinse BID  . omega-3 acid ethyl esters  1 g Oral Daily  . pravastatin  40 mg Oral Daily  . sodium chloride flush  3 mL Intravenous Q12H  . tamsulosin  0.4 mg  Oral Daily  . Warfarin - Pharmacist Dosing Inpatient   Does not apply q1800    LOS: 2 days      On-Call/Text Page:      [email or web address]      password [email or web address]  If 7PM-7AM, please contact night-coverage [email or web address] Password [facility] 02/01/2017, 10:42 AM

## 2017-02-01 NOTE — Progress Notes (Signed)
At change of shift report patient on non-rebreather mask at 100% O2/10L. RT contacted to trial patient on HFNC. RT setup and placed on 10L. At this time, patient appears to be breathing more through mouth than nose. When breaths through nose patient sats remain in mid-high 90s, otherwise 87-91%. Will continue to monitor patient.

## 2017-02-01 NOTE — Progress Notes (Signed)
Patient ID: Darrell Mendoza, male   DOB: [birth year], 83 y.o.   MRN: [id number]  Given the non-displaced nature of this fracture of the right hip and the stability of his right hip hemiarthroplasty, I am going to try to treat this non-operatively.  For now, he should keep weight off of that leg for only about 4-6 weeks and it has a good change of healing given its location.  Also, with his current medical status, I do not feel that he would tolerate a significant operation.  I will try to speak to his family about this.

## 2017-02-01 NOTE — Consult Note (Signed)
Consultation Note Date: 02/01/2017   Patient Name: Darrell Mendoza  DOB: 10-02-1925  MRN: [id number]  Age / Sex: 83 y.o., male  PCP: Haywood Pao, MD Referring Physician: Reyne Dumas, MD  Reason for Consultation: Establishing goals of care and Psychosocial/spiritual support  HPI/Patient Profile: 83 y.o. 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   Patient presented to ER s/p fall.   On arrival to ED, patient was found to have hypoxia.  X-ray shows right displaced fracture of femoral fracture.  Admitted for stabilization and treatment. Active treatment for sepsis/pneumonia and ARF.  Per son the patient has had continued physical and  functional decline over the past year; with weight loss and multiple falls  Family face advanced directives and anticipatory care needs   Clinical Assessment and Goals of Care:  This NP Wadie Lessen reviewed medical records, received report from team, assessed the patient and then meet at the patient's bedside along with his wife and son  to discuss diagnosis, prognosis, GOC, EOL wishes disposition and options.  A detailed discussion was had today regarding advanced directives.  Concepts specific to code status, artifical feeding and hydration, continued IV antibiotics and rehospitalization was had.  The difference between a aggressive medical intervention path  and a palliative comfort care path for this patient at this time was had.  Values and goals of care important to patient and family were attempted to be elicited.  Concept of  Palliative Medicine was discussed  Natural trajectory  and expectations at EOL and failure to thrive were discussed.  Questions and concerns addressed.   Family encouraged to call with questions or concerns.  PMT will continue to support holistically.    NEXT OF KIN/ wife and only son are main decision makers    SUMMARY OF RECOMMENDATIONS    Code Status/Advance Care Planning:  DNR   Palliative Prophylaxis:   Aspiration, Bowel Regimen, Delirium Protocol, Frequent Pain Assessment and Oral Care  Additional Recommendations (Limitations, Scope, Preferences):  Full Scope Treatment- re-meet in the morning  Psycho-social/Spiritual:   Desire for further Chaplaincy support:no  Additional Recommendations: Education on Hospice  Prognosis:   Unable to determine  Discharge Planning: To Be Determined      Primary Diagnoses: Present on Admission: . Hip fracture ([place]) . Acute respiratory failure with hypoxia ([place]) . Atrial fibrillation ([place]) . Chronic systolic CHF (congestive heart failure) ([place]) . Malignant neoplasm of right upper lobe of lung ([place]) . Coronary artery disease   I have reviewed the medical record, interviewed the patient and family, and examined the patient. The following aspects are pertinent.  Past Medical History:  Diagnosis Date  . Atrial fibrillation ([facility]) 12/11/2015  . Benign prostatic hypertrophy   . Bilateral cataracts   . CKD (chronic  kidney disease) stage 3, GFR 30-59 ml/min   . Coronary artery disease    s/p 3.0x23 cypher mid rca 2004  . Deep vein thrombosis (H[facility])    Coumadin anticoagulation for deep vein thrombosis  . Greenfield filter in place    History of chronic lower extremity clots  . Hand arthritis   . Hyperlipidemia   . Hypertension     History of hypertension  . Hypotension   . Lightheadedness   . Osteosarcoma ([place]) 2001   of the right hip  . Premature ventricular contractions   . Pulmonary embolism ([place])    05/2011   Social History   Social History  . Marital status: Married     Spouse name: N/A  . Number of children: 2  . Years of education: N/A   Occupational History  . factory  Retired    retired   Social History Main Topics  . Smoking status: Former Smoker    Packs/day: 1.00    Years: 30.00    Types: Cigarettes    Quit date: 10/12/1967  . Smokeless tobacco: Former Systems developer    Quit date: 10/20/1983  . Alcohol use 0.0 oz/week     Comment: very little  . Drug use: No  . Sexual activity: Not Currently    Birth control/ protection: None   Other Topics Concern  . None   Social History Narrative  . None   Family History  Problem Relation Age of Onset  . Tuberculosis Mother    Scheduled Meds: . calcium-vitamin D  1 tablet Oral Daily  . carvedilol  3.125 mg Oral BID  . cholecalciferol  1,000 Units Oral Daily  . feeding supplement (ENSURE ENLIVE)  237 mL Oral BID BM  . mouth rinse  15 mL Mouth Rinse BID  . omega-3 acid ethyl esters  1 g Oral Daily  . pravastatin  40 mg Oral Daily  . sodium chloride flush  3 mL Intravenous Q12H  . tamsulosin  0.4 mg Oral Daily  . Warfarin - Pharmacist Dosing Inpatient   Does not apply q1800   Continuous Infusions: . sodium chloride 60 mL/hr at 02/01/17 1422  . ceFEPime (MAXIPIME) IV Stopped (01/31/17 1719)  . vancomycin 1,000 mg (02/01/17 1431)   PRN Meds:.levalbuterol, ondansetron **OR** ondansetron (ZOFRAN) IV, polyethylene glycol, traZODone Medications Prior to Admission:  Prior to Admission medications   Medication Sig Start Date End Date Taking? Authorizing Provider  Calcium Carb-Cholecalciferol (CALCIUM 600-D PO) Take 600 mg by mouth daily.   Yes Historical Provider, MD  carvedilol (COREG) 3.125 MG tablet TAKE 1 TABLET BY MOUTH TWICE A DAY 11/01/16  Yes Peter M Martinique, MD  cholecalciferol (VITAMIN D) 1000 units tablet Take 1,000 Units by mouth daily.   Yes Historical Provider, MD  finasteride (PROSCAR) 5 MG tablet Take 5 mg by mouth at bedtime.  12/26/15  Yes Historical Provider, MD  fish oil-omega-3 fatty acids  1000 MG capsule Take 1 g by mouth daily.    Yes Historical Provider, MD  furosemide (LASIX) 20 MG tablet Take 1 tablet by mouth daily. 07/16/16  Yes Historical Provider, MD  pravastatin (PRAVACHOL) 40 MG tablet Take 40 mg by mouth at bedtime.    Yes Historical Provider, MD  Tamsulosin HCl (FLOMAX) 0.4 MG CAPS Take 0.4 mg by mouth at bedtime.    Yes Historical Provider, MD  vitamin C (ASCORBIC ACID) 500 MG tablet Take 500 mg by mouth daily.   Yes Historical Provider, MD  warfarin (COUMADIN) 5 MG  tablet Take 0.5-1 tablets (2.5-5 mg total) by mouth daily. Take 2.5 mg Sun / Tues / Wed / Thurs / Sat Take 5 mg on Mon / Fri Patient taking differently: Take 2.5 mg by mouth daily.  02/16/16  Yes Reyne Dumas, MD  Multiple Vitamin (MULTIVITAMIN) tablet Take 1 tablet by mouth daily.    Historical Provider, MD   Allergies  Allergen Reactions  . Lisinopril     Had hyperkalemia and worsening renal function   Review of Systems  Unable to perform ROS: Acuity of condition    Physical Exam  Constitutional: He appears well-developed. He appears lethargic. He appears ill. Face mask in place.  Cardiovascular: Tachycardia present.   Pulmonary/Chest: He has decreased breath sounds.  Neurological: He appears lethargic.  Skin: Skin is warm and dry.    Vital Signs: BP 106/73   Pulse (!) 49   Temp 98 F (36.7 C) (Oral)   Resp (!) 23   Ht '5\' 7"'$  (1.702 m)   Wt 75.2 kg (165 lb 12.6 oz)   SpO2 100%   BMI 25.97 kg/m  Pain Assessment: PAINAD POSS *See Group Information*: 1-Acceptable,Awake and alert Pain Score: 1    SpO2: SpO2: 100 % O2 Device:SpO2: 100 % O2 Flow Rate: .O2 Flow Rate (L/min): 15 L/min  IO: Intake/output summary:  Intake/Output Summary (Last 24 hours) at 02/01/17 1522 Last data filed at 02/01/17 1500  Gross per 24 hour  Intake             3335 ml  Output             1400 ml  Net             1935 ml    LBM: Last BM Date: 01/28/17 Baseline Weight: Weight: 68.5 kg (151 lb) Most  recent weight: Weight: 75.2 kg (165 lb 12.6 oz)      Palliative Assessment/Data: 30 % at best   Discussed with bedside nurse/ Anna  Re-meet in the morning at 0800  Time In: 1500 Time Out: 1615 Time Total: 75 min Greater than 50%  of this time was spent counseling and coordinating care related to the above assessment and plan.  Signed by: Wadie Lessen, NP   Please contact Palliative Medicine Team phone at [phone] for questions and concerns.  For individual provider: See Shea Evans

## 2017-02-01 NOTE — Evaluation (Signed)
Occupational Therapy Evaluation Patient Details Name: Darrell Mendoza MRN: [id number] DOB: 04/01/1925 Today's Date: 02/01/2017    History of Present Illness 83 yo admitted after fall at home with right femur fx being treated conservatively, sepsis and PNA. PMHx: lung CA, AFib, CHF, R hip hemiarthoplasty d/t osteosarcoma   Clinical Impression   Pt was independent in self care and has a hx of falls with pt refusing to use AD. Pt presents with lethargy, pain and dependence in all ADL and bed level mobility. Pt currently on 15L 02 via NRB with VSS. Will follow acutely. Pt will need post acute rehab in SNF.     Follow Up Recommendations  SNF;Supervision/Assistance - 24 hour    Equipment Recommendations       Recommendations for Other Services       Precautions / Restrictions Precautions Precautions: Fall Precaution Comments: bed to chair only per ortho note Restrictions Weight Bearing Restrictions: Yes RLE Weight Bearing: Non weight bearing      Mobility Bed Mobility               General bed mobility comments: total assist with attempt to roll  Transfers                 General transfer comment: deferred this date    Balance                                           ADL either performed or assessed with clinical judgement   ADL                                         General ADL Comments: Pt is currently requiring total assist.     Vision   Additional Comments: pt opening eyes momentarily x 1     Perception     Praxis      Pertinent Vitals/Pain Pain Assessment: Faces Pain Score: 4  Faces Pain Scale: Hurts even more Pain Location: right leg, neck Pain Descriptors / Indicators: Grimacing;Guarding;Moaning Pain Intervention(s): Heat applied;Monitored during session;Repositioned (to neck)     Hand Dominance Right   Extremity/Trunk Assessment Upper Extremity Assessment Upper Extremity Assessment: Generalized  weakness (L shoulder with pain from previous fall)   Lower Extremity Assessment Lower Extremity Assessment: Defer to PT evaluation RLE Deficits / Details: pt able to slightly bend knee and move ankle but could not tolerate further movement of RLE RLE: Unable to fully assess due to pain       Communication Communication Communication: No difficulties (minimally conversant)   Cognition Arousal/Alertness: Lethargic Behavior During Therapy: Flat affect Overall Cognitive Status: Impaired/Different from baseline Area of Impairment: Following commands;Attention;Orientation                 Orientation Level: Time;Situation Current Attention Level: Focused   Following Commands: Follows one step commands with increased time;Follows one step commands inconsistently           General Comments       Exercises    Shoulder Instructions      Home Living Family/patient expects to be discharged to:: Private residence Living Arrangements: Spouse/significant other Available Help at Discharge: Family;Available 24 hours/day Type of Home: House Home Access: Stairs to enter [employer] of Steps: 4 Entrance Stairs-Rails: Right;Left;Can reach both  Home Layout: One level     Bathroom Shower/Tub: Tub/shower unit;Curtain   Biochemist, clinical: Standard     Home Equipment: Environmental consultant - 2 wheels;Cane - single point;Tub bench          Prior Functioning/Environment Level of Independence: Needs assistance  Gait / Transfers Assistance Needed: pt with hx of multiple falls, would not use AD ADL's / Homemaking Assistance Needed: per wife, pt performed self care independently, wife resposible for meals and housekeeping   Comments: pt reports being independent with 3 falls in the last 18 months        OT Problem List: Decreased strength;Decreased activity tolerance;Decreased range of motion;Pain;Cardiopulmonary status limiting activity;Decreased knowledge of precautions      OT  Treatment/Interventions: Self-care/ADL training;Therapeutic activities;Patient/family education;Balance training    OT Goals(Current goals can be found in the care plan section) Acute Rehab OT Goals Patient Stated Goal: wife agreeable for pt to go to SNF for further rehab OT Goal Formulation: With family Time For Goal Achievement: 02/15/17 Potential to Achieve Goals: Fair ADL Goals Pt Will Perform Eating: with set-up;bed level Pt Will Perform Grooming: with set-up;bed level Additional ADL Goal #1: Pt will roll with max assist for positioning and ADL. Additional ADL Goal #2: Pt will tolerate supported sitting in recliner x 30 minutes.  OT Frequency: Min 2X/week   Barriers to D/C:            Co-evaluation              End of Session Equipment Utilized During Treatment: Oxygen (15L via rebreather)  Activity Tolerance: Patient limited by lethargy;Patient limited by pain Patient left: in bed;with call bell/phone within reach;with family/visitor present  OT Visit Diagnosis: Muscle weakness (generalized) (M62.81);Pain Pain - Right/Left: Right Pain - part of body: Hip                Time: 1415-1435 OT Time Calculation (min): 20 min Charges:  OT General Charges $OT Visit: 1 Procedure OT Evaluation $OT Eval Moderate Complexity: 1 Procedure G-Codes:     Malka So 02/01/2017, 3:34 PM  [phone]

## 2017-02-01 NOTE — Clinical Social Work Note (Signed)
Clinical Social Work Assessment  Patient Details  Name: Darrell Mendoza MRN: [id number] Date of Birth: 07-31-1925  Date of referral:  02/01/17               Reason for consult:  Facility Placement                Permission sought to share information with:  Facility Sport and exercise psychologist, Family Supports Permission granted to share information::  No (patient disoriented- received permission from spouse)  Name::     Wallie Renshaw  Agency::  SNF  Relationship::  wife, son  Contact Information:     Housing/Transportation Living arrangements for the past 2 months:  Single Family Home Source of Information:  Adult Children, Spouse Patient Interpreter Needed:  None Criminal Activity/Legal Involvement Pertinent to Current Situation/Hospitalization:  No - Comment as needed Significant Relationships:  Adult Children, Spouse Lives with:  Spouse Do you feel safe going back to the place where you live?  No Need for family participation in patient care:  Yes (Comment) (needs help with decision making at this time)  Care giving concerns:  Patient lives at home with spouse- spouse unable to care for patient given current impairment.  Patient will be unable to bear weight on leg for 4-6 weeks and spouse already struggling to keep patient safe at home.  Son reports patient was attempting to remain independent but had several recent falls.  With patients increased care needs they do not feel confident patient would be safe at home.   Social Worker assessment / plan:  CSW met with pt and pt son to discuss PT recommendation for SNF.  Patient asleep during assessment and documented as oriented x1.  CSW explained SNF and SNF referral process.  Family reports pt has not been to SNF before and they are unfamiliar with local options- CSW provided list of facilities.  Employment status:  Retired Nurse, adult PT Recommendations:  [place] / Referral to  community resources:  [place]  Patient/Family's Response to care:  Family is agreeable to SNF placement- very realistic about patients increased care needs and that he would not be safe to return home at this time.  Patient/Family's Understanding of and Emotional Response to Diagnosis, Current Treatment, and Prognosis:  Family has good understanding of pt current medical condition and treatment plan- hopeful that patient will recover at SNF and be safe to return home.  Emotional Assessment Appearance:  Appears stated age Attitude/Demeanor/Rapport:  Unable to Assess Affect (typically observed):  Unable to Assess Orientation:  Oriented to Self Alcohol / Substance use:  Not Applicable Psych involvement (Current and /or in the community):  No (Comment)  Discharge Needs  Concerns to be addressed:  Care Coordination, Discharge Planning Concerns Readmission within the last 30 days:  No Current discharge risk:  Physical Impairment Barriers to Discharge:  Continued Medical Work up   Jorge Ny, LCSW 02/01/2017, 3:46 PM

## 2017-02-01 NOTE — NC FL2 (Signed)
[place] LEVEL OF CARE SCREENING TOOL     IDENTIFICATION  Patient Name: Darrell Mendoza Birthdate: 01-17-1925 Sex: male Admission Date (Current Location): 02/04/2017  [facility] and [place] Number:  Herbalist and Address:  [facility], [place] [address] [place], [place] [address]      Provider Number: [id number]  Attending Physician Name and Address:  Reyne Dumas, MD  Relative Name and Phone Number:       Current Level of Care: Hospital Recommended Level of Care: [place] Prior Approval Number:    Date Approved/Denied:   PASRR Number: [id number]  Discharge Plan: SNF    Current Diagnoses: Patient Active Problem List   Diagnosis Date Noted  . Hip fracture ([place]) 02/06/2017  . Obstructive pneumonia 01/29/2017  . Severe sepsis with septic shock ([place]) 01/28/2017  . Anemia 01/10/2017  . Malignant neoplasm of right upper lobe of lung ([place]) 07/12/2016  . Palliative care encounter 02/12/2016  . DNR (do not resuscitate) 02/12/2016  . Weakness generalized 02/12/2016  . Lung mass   . Acute respiratory failure with hypoxia ([place]) 02/11/2016  . UTI (lower urinary tract infection) 02/11/2016  . Compression fracture of L1 lumbar vertebra ([place]) 02/11/2016  . Chronic systolic CHF (congestive heart failure) ([place]) 01/26/2016  . Nonischemic cardiomyopathy ([place]) 01/26/2016  . Atrial fibrillation ([place]) 12/11/2015  . Pulmonary embolism ([place])   . Deep vein thrombosis ([place])   . Hyperlipidemia   . Hypertension   . Coronary artery disease     Orientation RESPIRATION BLADDER Height & Weight     Self  O2 (see DC summary) External catheter, Incontinent Weight: 165 lb 12.6 oz (75.2 kg) Height:  '5\' 7"'$  (170.2 cm)  BEHAVIORAL SYMPTOMS/MOOD NEUROLOGICAL BOWEL NUTRITION STATUS      Continent Diet (cardiac)  AMBULATORY STATUS COMMUNICATION OF NEEDS Skin   Total Care Verbally Normal                       Personal Care  Assistance Level of Assistance  Bathing, Feeding, Dressing Bathing Assistance: Maximum assistance Feeding assistance: Maximum assistance Dressing Assistance: Maximum assistance     Functional Limitations Info  Sight, Hearing Sight Info: Impaired (wears glasses) Hearing Info: Impaired (sightly hard of hearing)      SPECIAL CARE FACTORS FREQUENCY  PT (By licensed PT), OT (By licensed OT)     PT Frequency: 5/wk OT Frequency: 5/wk            Contractures      Additional Factors Info  Code Status, Allergies Code Status Info: DNR Allergies Info: Lisinopril           Current Medications (02/01/2017):  This is the current hospital active medication list Current Facility-Administered Medications  Medication Dose Route Frequency Provider Last Rate Last Dose  . 0.9 %  sodium chloride infusion   Intravenous Continuous Cherene Altes, MD 60 mL/hr at 02/01/17 1422    . calcium-vitamin D (OSCAL WITH D) 500-200 MG-UNIT per tablet 1 tablet  1 tablet Oral Daily Sharen Hint [place], PA-C   1 tablet at 02/01/17 1018  . carvedilol (COREG) tablet 3.125 mg  3.125 mg Oral BID Brenton Grills, PA-C   3.125 mg at 02/01/17 1018  . ceFEPIme (MAXIPIME) 2 g in dextrose 5 % 50 mL IVPB  2 g Intravenous Q24H Romona Curls, [facility]   Stopped at 01/31/17 1719  . cholecalciferol (VITAMIN D) tablet 1,000 Units  1,000 Units  Oral Daily Truett Mainland, DO   1,000 Units at 02/01/17 1018  . feeding supplement (ENSURE ENLIVE) (ENSURE ENLIVE) liquid 237 mL  237 mL Oral BID BM Cherene Altes, MD   237 mL at 02/01/17 1019  . levalbuterol (XOPENEX) nebulizer solution 0.63 mg  0.63 mg Nebulization Q3H PRN Cherene Altes, MD      . MEDLINE mouth rinse  15 mL Mouth Rinse BID Tanna Savoy Stinson, DO   15 mL at 02/01/17 1019  . omega-3 acid ethyl esters (LOVAZA) capsule 1 g  1 g Oral Daily Tanna Savoy Stinson, DO   1 g at 02/01/17 1018  . ondansetron (ZOFRAN) tablet 4 mg  4 mg Oral Q6H PRN Brenton Grills, PA-C        Or  . ondansetron [facility]) injection 4 mg  4 mg Intravenous Q6H PRN Brenton Grills, PA-C      . polyethylene glycol (MIRALAX / GLYCOLAX) packet 17 g  17 g Oral Daily PRN Brenton Grills, PA-C      . pravastatin (PRAVACHOL) tablet 40 mg  40 mg Oral Daily Brenton Grills, PA-C   40 mg at 02/01/17 1018  . sodium chloride flush (NS) 0.9 % injection 3 mL  3 mL Intravenous Q12H Brenton Grills, PA-C   3 mL at 02/01/17 1019  . tamsulosin (FLOMAX) capsule 0.4 mg  0.4 mg Oral Daily Brenton Grills, PA-C   0.4 mg at 02/01/17 1018  . traZODone (DESYREL) tablet 25 mg  25 mg Oral QHS PRN Brenton Grills, PA-C   25 mg at 01/31/2017 2250  . vancomycin (VANCOCIN) IVPB 1000 mg/200 mL premix  1,000 mg Intravenous Q24H Reyne Dumas, MD 200 mL/hr at 02/01/17 1431 1,000 mg at 02/01/17 1431  . Warfarin - Pharmacist Dosing Inpatient   Does not apply [place] Baird, [place] at 02/01/17 1800     Discharge Medications: Please see discharge summary for a list of discharge medications.  Relevant Imaging Results:  Relevant Lab Results:   Additional Information SS#: [id number]  Jorge Ny, LCSW

## 2017-02-01 NOTE — Evaluation (Signed)
Physical Therapy Evaluation Patient Details Name: Darrell Mendoza MRN: [id number] DOB: [birth year] Today's Date: 02/01/2017   History of Present Illness  83 yo admitted after fall at home with right femur fx being treated conservatively, sepsis and PNA. PMHx: lung CA, AFib, CHF, R hip hemiarthoplasty d/t osteosarcoma  Clinical Impression  Pt lethargic but able to respond to questions and commands with cues. Pt on 15L NRB with sats 100% and HR 101 throughout session. Pt with RLE severely limited by pain and unable to tolerate transfers at this time. Pt with decreased strength, ROM, transfers, mobility and pain limiting function who will benefit from acute therapy to maximize mobility, strength and function. Pt encouraged to continue LLE ROM throughout day and to assist with transfers with nursing as able.     Follow Up Recommendations SNF;Supervision/Assistance - 24 hour    Equipment Recommendations  Wheelchair (measurements PT);Hospital bed;Wheelchair cushion (measurements PT)    Recommendations for Other Services       Precautions / Restrictions Precautions Precautions: Fall Precaution Comments: bed to chair only per ortho note Restrictions Weight Bearing Restrictions: Yes RLE Weight Bearing: Non weight bearing      Mobility  Bed Mobility               General bed mobility comments: pt able to attempt supine to long sitting but could only achieve propping on elbows with mod assist, limited by pain, pt denied rolling or pivoting due to RLE pain  Transfers                    Ambulation/Gait                Stairs            Wheelchair Mobility    Modified Rankin (Stroke Patients Only)       Balance                                             Pertinent Vitals/Pain Pain Assessment: 0-10 Pain Score: 4  Pain Location: right leg Pain Descriptors / Indicators: Sore Pain Intervention(s): Limited activity within patient's  tolerance;Repositioned    Home Living Family/patient expects to be discharged to:: Private residence Living Arrangements: Spouse/significant other Available Help at Discharge: Family;Available 24 hours/day Type of Home: House Home Access: Stairs to enter Entrance Stairs-Rails: Right;Left;Can reach both Entrance Stairs-Number of Steps: 4 Home Layout: One level Home Equipment: Walker - 2 wheels;Cane - single point;Tub bench      Prior Function Level of Independence: Independent         Comments: pt reports being independent with 3 falls in the last 18 months     Hand Dominance        Extremity/Trunk Assessment   Upper Extremity Assessment Upper Extremity Assessment: Generalized weakness    Lower Extremity Assessment Lower Extremity Assessment: RLE deficits/detail;Generalized weakness RLE Deficits / Details: pt able to slightly bend knee and move ankle but could not tolerate further movement of RLE RLE: Unable to fully assess due to pain       Communication   Communication: No difficulties  Cognition Arousal/Alertness: Lethargic Behavior During Therapy: WFL for tasks assessed/performed Overall Cognitive Status: Impaired/Different from baseline Area of Impairment: Following commands;Attention;Orientation                 Orientation Level: Time Current Attention Level: Sustained  Following Commands: Follows one step commands with increased time              General Comments      Exercises General Exercises - Lower Extremity Heel Slides: AAROM;Right;Supine;5 reps;AROM;Left;10 reps (grossly 20degrees only, 5 reps, AAROM on RLE) Hip ABduction/ADduction: AROM;Left;Supine;10 reps   Assessment/Plan    PT Assessment Patient needs continued PT services  PT Problem List Decreased strength;Decreased mobility;Decreased activity tolerance;Decreased range of motion;Pain;Decreased balance;Decreased knowledge of use of DME       PT Treatment Interventions  DME instruction;Therapeutic activities;Therapeutic exercise;Balance training;Functional mobility training;Patient/family education    PT Goals (Current goals can be found in the Care Plan section)  Acute Rehab PT Goals Patient Stated Goal: agreeable to attempt progressive mobility PT Goal Formulation: With patient Time For Goal Achievement: 02/15/17 Potential to Achieve Goals: Fair    Frequency Min 3X/week   Barriers to discharge Decreased caregiver support      Co-evaluation               End of Session Equipment Utilized During Treatment: Oxygen Activity Tolerance: Patient limited by lethargy;Patient limited by pain Patient left: in bed;with call bell/phone within reach;with bed alarm set Nurse Communication: Need for lift equipment;Mobility status PT Visit Diagnosis: Muscle weakness (generalized) (M62.81);History of falling (Z91.81)    Time: 2876-8115 PT Time Calculation (min) (ACUTE ONLY): 14 min   Charges:   PT Evaluation $PT Eval Moderate Complexity: 1 Procedure     PT G Codes:        Elwyn Reach, PT [phone]   Samatha Anspach B Chloee Tena 02/01/2017, 12:43 PM

## 2017-02-01 NOTE — Progress Notes (Signed)
Pharmacy Antibiotic Note Darrell Mendoza is a 83 y.o. male admitted on 01/13/2017 with pneumonia in setting of R hip fracture. Currently on day 3 of Cefepime and pharmacy asked to resume vancomycin that was previously discontinued on 4/23.  Plan: 1. Vancomycin 1000 IV every 24 hours.  Goal trough 15-20 mcg/mL.  2. Continue Cefepime 2 grams IV every 24 hours.  3. SCr every 72 hours while on vancomycin. 4. Consider stopping vancomycin soon as MRSA PCR negative and correlates with a negative predictive value of greater than 99% for MRSA pneumonia.  5. Limit abx exposure as feasible .  Height: '5\' 7"'$  (170.2 cm) Weight: 165 lb 12.6 oz (75.2 kg) IBW/kg (Calculated) : 66.1  Temp (24hrs), Avg:97.9 F (36.6 C), Min:97.6 F (36.4 C), Max:98.5 F (36.9 C)   Recent Labs Lab 01/27/2017 1147 01/15/2017 1707 01/31/17 0410 02/01/17 0429  WBC 3.3*  --  3.6* 4.2  CREATININE 1.40*  --  1.27* 1.23  LATICACIDVEN  --  1.23  --   --     Estimated Creatinine Clearance: 38.1 mL/min (by C-G formula based on SCr of 1.23 mg/dL).    Allergies  Allergen Reactions  . Lisinopril     Had hyperkalemia and worsening renal function    Antimicrobials this admission: 4/22 Cefepime >> 4/22 vancomycin >> 4/23, resumed 4/24 >>    Microbiology results: 4/22 BCx: ngtd 4/22 MRSA PCR: negative   Thank you for allowing pharmacy to be a part of this patient's care.  Vincenza Hews, PharmD, BCPS 02/01/2017, 1:02 PM

## 2017-02-01 NOTE — Progress Notes (Signed)
[place] for warfarin Indication: atrial fibrillation   Assessment: 89 yom on warfarin PTA for afib, also hx DVT/PE, presents s/p fall with hip fracture - ortho planning non operative management. INR slightly higher than desired at 3.36 this am despite 1 mg dose over last two days days. Hgb 9.4 > 8.1, PLTc 203 with no reported bleeding.   PTA warfarin dose: 2.'5mg'$  PO daily (last dose 4/21 PTA)  Goal of Therapy:  INR 2-3 Monitor platelets by anticoagulation protocol: Yes    Plan:  1. Hold warfarin dose this evening  2. Daily INR 3. Monitor CBC, s/sx bleeding  Vincenza Hews, PharmD, BCPS 02/01/2017, 8:40 AM

## 2017-02-02 DIAGNOSIS — R0609 Other forms of dyspnea: Secondary | ICD-10-CM

## 2017-02-02 DIAGNOSIS — R0600 Dyspnea, unspecified: Secondary | ICD-10-CM

## 2017-02-02 DIAGNOSIS — Z515 Encounter for palliative care: Secondary | ICD-10-CM

## 2017-02-02 LAB — CBC
HCT: 27.5 % — ABNORMAL LOW (ref 39.0–52.0)
Hemoglobin: 8.4 g/dL — ABNORMAL LOW (ref 13.0–17.0)
MCH: 28.3 pg (ref 26.0–34.0)
MCHC: 30.5 g/dL (ref 30.0–36.0)
MCV: 92.6 fL (ref 78.0–100.0)
PLATELETS: 209 10*3/uL (ref 150–400)
RBC: 2.97 MIL/uL — ABNORMAL LOW (ref 4.22–5.81)
RDW: 16.8 % — AB (ref 11.5–15.5)
WBC: 4.1 10*3/uL (ref 4.0–10.5)

## 2017-02-02 LAB — COMPREHENSIVE METABOLIC PANEL
ALT: 53 U/L (ref 17–63)
AST: 45 U/L — ABNORMAL HIGH (ref 15–41)
Albumin: 2.1 g/dL — ABNORMAL LOW (ref 3.5–5.0)
Alkaline Phosphatase: 85 U/L (ref 38–126)
Anion gap: 5 (ref 5–15)
BUN: 22 mg/dL — ABNORMAL HIGH (ref 6–20)
CO2: 24 mmol/L (ref 22–32)
Calcium: 8.5 mg/dL — ABNORMAL LOW (ref 8.9–10.3)
Chloride: 105 mmol/L (ref 101–111)
Creatinine, Ser: 1.15 mg/dL (ref 0.61–1.24)
GFR, EST NON AFRICAN AMERICAN: 54 mL/min — AB (ref >60)
Glucose, Bld: 131 mg/dL — ABNORMAL HIGH (ref 65–99)
POTASSIUM: 4.7 mmol/L (ref 3.5–5.1)
Sodium: 134 mmol/L — ABNORMAL LOW (ref 135–145)
Total Bilirubin: 0.5 mg/dL (ref 0.3–1.2)
Total Protein: 5.9 g/dL — ABNORMAL LOW (ref 6.5–8.1)

## 2017-02-02 LAB — PROTIME-INR
INR: 2.73
Prothrombin Time: 29.5 seconds — ABNORMAL HIGH (ref 11.4–15.2)

## 2017-02-02 MED ORDER — MORPHINE SULFATE (PF) 2 MG/ML IV SOLN
2.0000 mg | INTRAVENOUS | Status: DC | PRN
  Administered 2017-02-02 – 2017-02-03 (×5): 2 mg via INTRAVENOUS
  Filled 2017-02-02 (×6): qty 1

## 2017-02-02 MED ORDER — WARFARIN SODIUM 1 MG PO TABS
1.0000 mg | ORAL_TABLET | Freq: Once | ORAL | Status: DC
  Filled 2017-02-02: qty 1

## 2017-02-02 MED ORDER — LORAZEPAM 2 MG/ML IJ SOLN
1.0000 mg | INTRAMUSCULAR | Status: DC | PRN
  Administered 2017-02-03: 1 mg via INTRAVENOUS
  Filled 2017-02-02: qty 1

## 2017-02-02 NOTE — Progress Notes (Signed)
Daily Progress Note   Patient Name: Darrell Mendoza       Date: 02/02/2017 DOB: [birth year]  Age: 83 y.o. MRN#: [id number] Attending Physician: Cherene Altes, MD Primary Care Physician: Haywood Pao, MD Admit Date: 01/12/2017  Reason for Consultation/Follow-up: Establishing goals of care, Non pain symptom management and Psychosocial/spiritual support  Subjective:  - meet at the patient's bedside along with his wife and son tio continue conversation regarding  diagnosis, prognosis, GOC, EOL wishes disposition and options.  Family express understanding that patient is declining and transitioning at EOL    The difference between a aggressive medical intervention path  and a palliative comfort care path for this patient at this time was had.    Focus of care is comfort and dignity  Natural trajectory and expectations at EOL were discussed.  Questions and concerns addressed.   Family encouraged to call with questions or concerns.  PMT will continue to support holistically.   Length of Stay: 3  Current Medications: Scheduled Meds:  . mouth rinse  15 mL Mouth Rinse BID  . sodium chloride flush  3 mL Intravenous Q12H  . tamsulosin  0.4 mg Oral Daily  . warfarin  1 mg Oral ONCE-1800  . Warfarin - Pharmacist Dosing Inpatient   Does not apply q1800    Continuous Infusions: . sodium chloride 60 mL/hr at 02/02/17 0741    PRN Meds: levalbuterol, LORazepam, morphine injection, ondansetron **OR** ondansetron (ZOFRAN) IV  Physical Exam  Constitutional: He appears listless. He appears ill. Nasal cannula in place.  Cardiovascular: Bradycardia present.   Pulmonary/Chest: He has decreased breath sounds in the right lower field and the left lower field.  Neurological: He appears listless.    Skin: Skin is warm and dry. There is pallor.            Vital Signs: BP 99/67   Pulse (!) 49   Temp 97.6 F (36.4 C) (Axillary)   Resp (!) 22   Ht '5\' 7"'$  (1.702 m)   Wt 75.8 kg (167 lb 1.7 oz)   SpO2 97%   BMI 26.17 kg/m  SpO2: SpO2: 97 % O2 Device: O2 Device: High Flow Nasal Cannula O2 Flow Rate: O2 Flow Rate (L/min): 10 L/min  Intake/output summary:  Intake/Output Summary (Last 24 hours) at 02/02/17 1232 Last data filed  at 02/02/17 1200  Gross per 24 hour  Intake             1690 ml  Output              575 ml  Net             1115 ml   LBM: Last BM Date: 02/01/17 Baseline Weight: Weight: 68.5 kg (151 lb) Most recent weight: Weight: 75.8 kg (167 lb 1.7 oz)       Palliative Assessment/Data: 20 % at best    Flowsheet Rows     Most Recent Value  Intake Tab  Referral Department  Hospitalist  Palliative Care Primary Diagnosis  Sepsis/Infectious Disease  Date Notified  02/01/17  Palliative Care Type  New Palliative care  Reason for referral  Clarify Goals of Care  Date of Admission  01/31/2017  Date first seen by Palliative Care  02/01/17  # of days Palliative referral response time  0 Day(s)  # of days IP prior to Palliative referral  2  Clinical Assessment  Palliative Performance Scale Score  30%  Psychosocial & Spiritual Assessment  Palliative Care Outcomes      Patient Active Problem List   Diagnosis Date Noted  . Palliative care by specialist   . Hip fracture ([place]) 01/11/2017  . Obstructive pneumonia 01/10/2017  . Severe sepsis with septic shock ([place]) 01/26/2017  . Anemia 02/01/2017  . Malignant neoplasm of right upper lobe of lung ([place]) 07/12/2016  . Palliative care encounter 02/12/2016  . DNR (do not resuscitate) 02/12/2016  . Weakness generalized 02/12/2016  . Lung mass   . Acute respiratory failure with hypoxia ([place]) 02/11/2016  . UTI (lower urinary tract infection) 02/11/2016  . Compression fracture of L1 lumbar vertebra ([place]) 02/11/2016  .  Chronic systolic CHF (congestive heart failure) ([place]) 01/26/2016  . Nonischemic cardiomyopathy ([place]) 01/26/2016  . Atrial fibrillation ([place]) 12/11/2015  . Pulmonary embolism ([place])   . Deep vein thrombosis ([place])   . Hyperlipidemia   . Hypertension   . Coronary artery disease     Palliative Care Assessment & Plan   Patient Profile:  83 y.o. male   admitted on 02/01/2017 with a past medical  history of PAF, chronic systolic heart failure with EF of 30-35% with diffuse hypokinesis,  lung cancer s/p palliative radiation, remote hip fracture due to osteosarcoma status post post hip replacement, DVT with anticoagulation and [place] filter, CK D stage III, hypertension.   Patient presented to ER s/p fall.   On arrival to ED, patient was found to have hypoxia.  X-ray shows right displaced fracture of femoral fracture.  Admitted for stabilization and treatment. Active treatment for sepsis/pneumonia and ARF.  Per son the patient has had continued physical and  functional decline over the past year; with weight loss and multiple falls  Family face advanced directives and anticipatory care needs   Assessment: - transitioning at EOL  Recommendations/Plan:  Comfort is focus of care  Symptom management  for dyspnea/ Morphine 2 mg IV every 2 hrs prn/ do not escalate O2                                                     Agitation: Ativan 1 mg IV every 4 hrs prn  Goals of Care and Additional Recommendations:  Limitations  on Scope of Treatment: Full Comfort Care  Code Status:    Code Status Orders        Start     Ordered   01/11/2017 1638  Do not attempt resuscitation (DNR)  Continuous    Question Answer Comment  In the event of cardiac or respiratory ARREST Do not call a "code blue"   In the event of cardiac or respiratory ARREST Do not perform Intubation, CPR, defibrillation or ACLS   In the event of cardiac or respiratory ARREST Use medication by any route, position, wound  care, and other measures to relive pain and suffering. May use oxygen, suction and manual treatment of airway obstruction as needed for comfort.      01/19/2017 1639    Code Status History    Date Active Date Inactive Code Status Order ID Comments User Context   02/12/2016  1:27 PM 02/13/2016  4:18 PM DNR [id number]  Knox Royalty, NP Inpatient   02/11/2016  9:56 PM 02/12/2016  1:27 PM Full Code [id number]  Etta Quill, DO ED    Advance Directive Documentation     Most Recent Value  Type of Advance Directive  Living will [other]  Pre-existing out of facility DNR order (yellow form or pink MOST form)  -  "MOST" Form in Place?  -       Prognosis:   Hours - Days  Discharge Planning:  To Be Determined  Care plan was discussed with Dr Thereasa Solo  Thank you for allowing the Palliative Medicine Team to assist in the care of this patient.   Time In: 1115 Time Out: 1150 Total Time 35 min Prolonged Time Billed  no       Greater than 50%  of this time was spent counseling and coordinating care related to the above assessment and plan.  Wadie Lessen, NP  Please contact Palliative Medicine Team phone at [phone] for questions and concerns.

## 2017-02-02 NOTE — Progress Notes (Signed)
[place] for warfarin Indication: atrial fibrillation   Assessment: 89 yom on warfarin PTA for afib, also hx DVT/PE, presents s/p fall with hip fracture - ortho planning non operative management.  Anticoag: warf pta for afib, hx dvt/pe. CTA neg for PE.  INR 2.73  PTA dose: 2.'5mg'$  daily (last dose 4/21 pta)  Renal: SCr 1.15  Heme/Onc: H&H 8.4/27.5, Plt 209  Goal of Therapy:  INR 2-3 Monitor platelets by anticoagulation protocol: Yes    Plan:  Warfarin 1 mg x 1 Daily INR, CBC q72h   Levester Fresh, PharmD, BCPS, BCCCP Clinical Pharmacist Clinical phone for 02/02/2017 from 7a-3:30p: [phone] If after 3:30p, please call main pharmacy at: [phone] 02/02/2017 9:47 AM

## 2017-02-02 NOTE — Progress Notes (Signed)
Palliative RN talked to the family, on comfort care now high flow [place] was switched to 2L ,started desating to low 80's family at bedside,they said they will come back later with the daughter in law.

## 2017-02-02 NOTE — Plan of Care (Signed)
Problem: Respiratory: Goal: Ability to maintain adequate ventilation will improve Outcome: Progressing Oral hygiene using oral care kit performed for patient with reports from patient of increased relief from dry mouth and increased comfort.

## 2017-02-02 NOTE — Clinical Social Work Note (Signed)
CSW met with family at bedside. Pt disoriented and coughing up fluid. CSW provided bed offers and spouse-Ida Borsuk and son-Micheal Semrad chose [facility]. CSW also provided choice for residential hospice as son stated Palliative RN reports hospice residential vs. Hospital death. Son chose [facility]. Per palliative note, comfort care is the focus. However, d/c planning is TBD, as prognosis is hours-days. CSW will continue to follow.   Oretha Ellis, [place], [place]  [phone]

## 2017-02-02 NOTE — Plan of Care (Signed)
Problem: Pain Managment: Goal: General experience of comfort will improve Outcome: Progressing Patient CPOT score indicates severe pain. RN able to secure order for PRN pain medication from on call NP to increase patient comfort and reduce pain.

## 2017-02-02 NOTE — Progress Notes (Signed)
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 On arrival to ED X-ray noted a right displaced femoral fracture, and the patient was hypotensive to the 70s-80s.   Subjective: The patient remains nonresponsive.  Palliative care has met with the patient's family on a number of occasions and the decision has now been made to transition to full comfort focused care.  I agree this is the most appropriate focus for this patient.  Assessment & Plan:  Disposition We have transitioned to comfort focused care - we will assure the patient is resting comfortably and not suffering with significant anxiety or air hunger or uncontrolled pain - the patient will be monitored overnight to allow him time to declare his probable clinical trajectory at which time a disposition will be formalized in regard to the most appropriate facility in which for him to receive his comfort care  Sepsis due to RUL and RML pneumonia - acute hypoxic resp failure   R periprosthetic non-displaced proximal femur fracture  Chronic systolic CHF EF 62-70% via TTE March 2017 - baseline wgt appears to be ~71kg   Lung carcinoma Was previously receiving palliative radiation therapy  History of DVT and PE Previously on coumadin anticoagulation  CAD with history of PCI  No evidence of ACS this hospital stay   Afib   Anemia rectal exam performed by EDP was negative for blood - likely due to lung cancer and poor nutritional status  CKD stage III  DVT prophylaxis: comfort care  Code Status:  DNR - NO CODE Family Communication: no family present at time of exam today Disposition Plan: comfort focused care   Consultants:  Orthopedics Palliative Care   Objective: Blood pressure 99/67, pulse (!) 53, temperature 97.6 F (36.4 C), temperature source Axillary, resp. rate (!) 25, height 5' 7"  (1.702 m), weight 75.8 kg (167 lb 1.7 oz), SpO2 (!) 86 %.  Intake/Output Summary (Last 24 hours) at 02/02/17 1346 Last data filed at 02/02/17 1200  Gross per 24 hour  Intake             1630 ml  Output              575 ml  Net             1055 ml   Filed Weights   01/31/2017 1120 02/01/17 0439 02/02/17 0507  Weight: 68.5 kg (151 lb) 75.2 kg (165 lb 12.6 oz) 75.8 kg (167 lb 1.7 oz)    Examination: General: No acute respiratory distress evident - lethargic  Lungs: Poor air movement th/o - no wheezing  Cardiovascular: Irregularly irregular and tachycardic without appreciable murmur or rub Abdomen: Nondistended, soft, bowel sounds positive  CBC:  Recent Labs Lab 02/01/2017 1147 01/31/17 0410 02/01/17 0429 02/02/17 0230  WBC 3.3* 3.6* 4.2 4.1  NEUTROABS 2.2  --   --   --   HGB 9.1* 9.4* 8.1* 8.4*  HCT 28.8* 30.3* 25.6* 27.5*  MCV 90.6 92.4 90.8 92.6  PLT 204 216 203 350   Basic Metabolic Panel:  Recent Labs Lab 01/12/2017 1147 01/31/17 0410 02/01/17  0429 02/02/17 0230  NA 130* 133* 134* 134*  K 4.3 4.5 4.1 4.7  CL 101 105 103 105  CO2 22 23 23 24   GLUCOSE 104* 103* 107* 131*  BUN 20 19 23* 22*  CREATININE 1.40* 1.27* 1.23 1.15  CALCIUM 8.3* 8.1* 8.1* 8.5*   GFR: Estimated Creatinine Clearance: 40.7 mL/min (by C-G formula based on SCr of 1.15 mg/dL).  Liver Function Tests:  Recent Labs Lab 02/02/2017 1147 02/01/17 0429 02/02/17 0230  AST 28 58* 45*  ALT 17 60 53  ALKPHOS 64 90 85  BILITOT 0.7 0.5 0.5  PROT 6.3* 5.8* 5.9*  ALBUMIN 2.4* 2.0* 2.1*    Coagulation Profile:  Recent Labs Lab 01/12/2017 1740 01/31/17 0410 02/01/17 0429 02/02/17 0230  INR 3.12  3.06 3.36 2.73    Cardiac Enzymes:  Recent Labs Lab 01/15/2017 1147  TROPONINI <0.03    Recent Results (from the past 240 hour(s))  Culture, blood (x 2)     Status: None (Preliminary result)   Collection Time: 01/26/2017  5:40 PM  Result Value Ref Range Status   Specimen Description BLOOD LEFT FOREARM  Final   Special Requests IN PEDIATRIC BOTTLE Blood Culture adequate volume  Final   Culture NO GROWTH 2 DAYS  Final   Report Status PENDING  Incomplete  Culture, blood (x 2)     Status: None (Preliminary result)   Collection Time: 01/21/2017  7:45 PM  Result Value Ref Range Status   Specimen Description BLOOD RIGHT HAND  Final   Special Requests   Final    BOTTLES DRAWN AEROBIC ONLY Blood Culture adequate volume   Culture NO GROWTH 2 DAYS  Final   Report Status PENDING  Incomplete  MRSA PCR Screening     Status: None   Collection Time: 01/19/2017  9:56 PM  Result Value Ref Range Status   MRSA by PCR NEGATIVE NEGATIVE Final    Comment:        The GeneXpert MRSA Assay (FDA approved for NASAL specimens only), is one component of a comprehensive MRSA colonization surveillance program. It is not intended to diagnose MRSA infection nor to guide or monitor treatment for MRSA infections.      Scheduled Meds: . mouth rinse  15 mL Mouth Rinse BID  . sodium chloride flush  3 mL Intravenous Q12H  . tamsulosin  0.4 mg Oral Daily  . warfarin  1 mg Oral ONCE-1800  . Warfarin - Pharmacist Dosing Inpatient   Does not apply q1800    LOS: 3 days   Cherene Altes, MD [facility] Office  [phone] Pager - Text Page per Amion as per below:  On-Call/Text Page:      [email or web address]      password [email or web address]  If 7PM-7AM, please contact night-coverage [email or web address] Password [email or web address] 02/02/2017, 1:46 PM

## 2017-02-02 NOTE — Plan of Care (Signed)
Problem: Respiratory: Goal: Ability to maintain adequate ventilation will improve Outcome: Progressing Patient transitioned from non-rebreather mask to HFNC at beginning of shift.

## 2017-02-04 LAB — CULTURE, BLOOD (ROUTINE X 2)
CULTURE: NO GROWTH
Culture: NO GROWTH
SPECIAL REQUESTS: ADEQUATE
Special Requests: ADEQUATE

## 2017-02-08 NOTE — Plan of Care (Signed)
Problem: Pain Managment: Goal: General experience of comfort will improve Outcome: Progressing Morphine and ativan given throughout shift for pain and agitation, pt seems more comfortable at this time.  Problem: Fluid Volume: Goal: Hemodynamic stability will improve Outcome: Not Progressing Comfort care  Problem: Respiratory: Goal: Ability to maintain adequate ventilation will improve Outcome: Not Progressing Comfort care  Problem: Activity: Goal: Ability to tolerate increased activity will improve Outcome: Not Progressing Comfort care   Problem: Coping: Goal: Verbalizations of decreased anxiety will increase Outcome: Progressing Ativan given for agitation, pt resting comfortably at this time.

## 2017-02-08 NOTE — Progress Notes (Signed)
Patient has no respirations or pulse. This RN and Ronette Deter, RN listened for apical heart sounds for 1 minute, NP notified, time of death at [phone]. Family at the bedside. [place] donor notified and pt is not a candidate for donation. Medical examiner called and stated pt is an ME case. NP notified.

## 2017-02-08 DEATH — deceased

## 2017-03-11 NOTE — Discharge Summary (Addendum)
   Death Summary  Darrell Mendoza ANV:[id number] DOB: 1925/02/20 DOA: 02/24/17  PCP: Haywood Pao, MD  Admit date: 2017/02/24 Date of Death: 02/28/17  Final Diagnoses:  Principal Problem:   Severe sepsis with septic shock [facility]) Active Problems:   Coronary artery disease   Atrial fibrillation (HCC)   Chronic systolic CHF (congestive heart failure) (HCC)   Acute respiratory failure with hypoxia (HCC)   Malignant neoplasm of right upper lobe of lung (HCC)   Hip fracture (HCC)   Obstructive pneumonia   Anemia   Palliative care by specialist   Dyspnea  History of present illness:  83 year old male with a history of PAF, chronic systolic heart failure with EF of 30-35% with diffuse hypokinesis, Stage 3 lung cancer on palliative radiation, 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 On arrival to ED X-ray noted a right displaced femoral fracture, and the patient was hypotensive to the 70s-80s.   Hospital Course:  The patient was admitted to the acute units and treatment was initiated for significant right upper lobe and right middle lobe pneumonias.  His care was complicated by his chronic conditions to include chronic systolic congestive heart failure, lung carcinoma, and DVT/PE.  Despite aggressive medical care the patient did not clinically improve and remained essentially obtunded throughout his hospital stay.  Given his multiple comorbidities and the serious nature of his acute disease discussion was held with the family in which it was explained that the patient was not likely to survive this hospital stay.  The patient's family wished to pursue a comfort focused approach and the medical team agreed this was most appropriate.  On 02/28/2017 at 03:36 the patient died.  SignedJoette Catching T  [facility] 02/08/2017, 6:09 PM

## 2017-11-24 IMAGING — DX DG HUMERUS 2V *R*
2 series · 2 of 2 positions shown · non-contrast
Comparison: None.

CLINICAL DATA: Mid right humerus pain following a fall at home this
morning.

EXAM:
RIGHT HUMERUS - 2+ VIEW

[x humerus ap right]
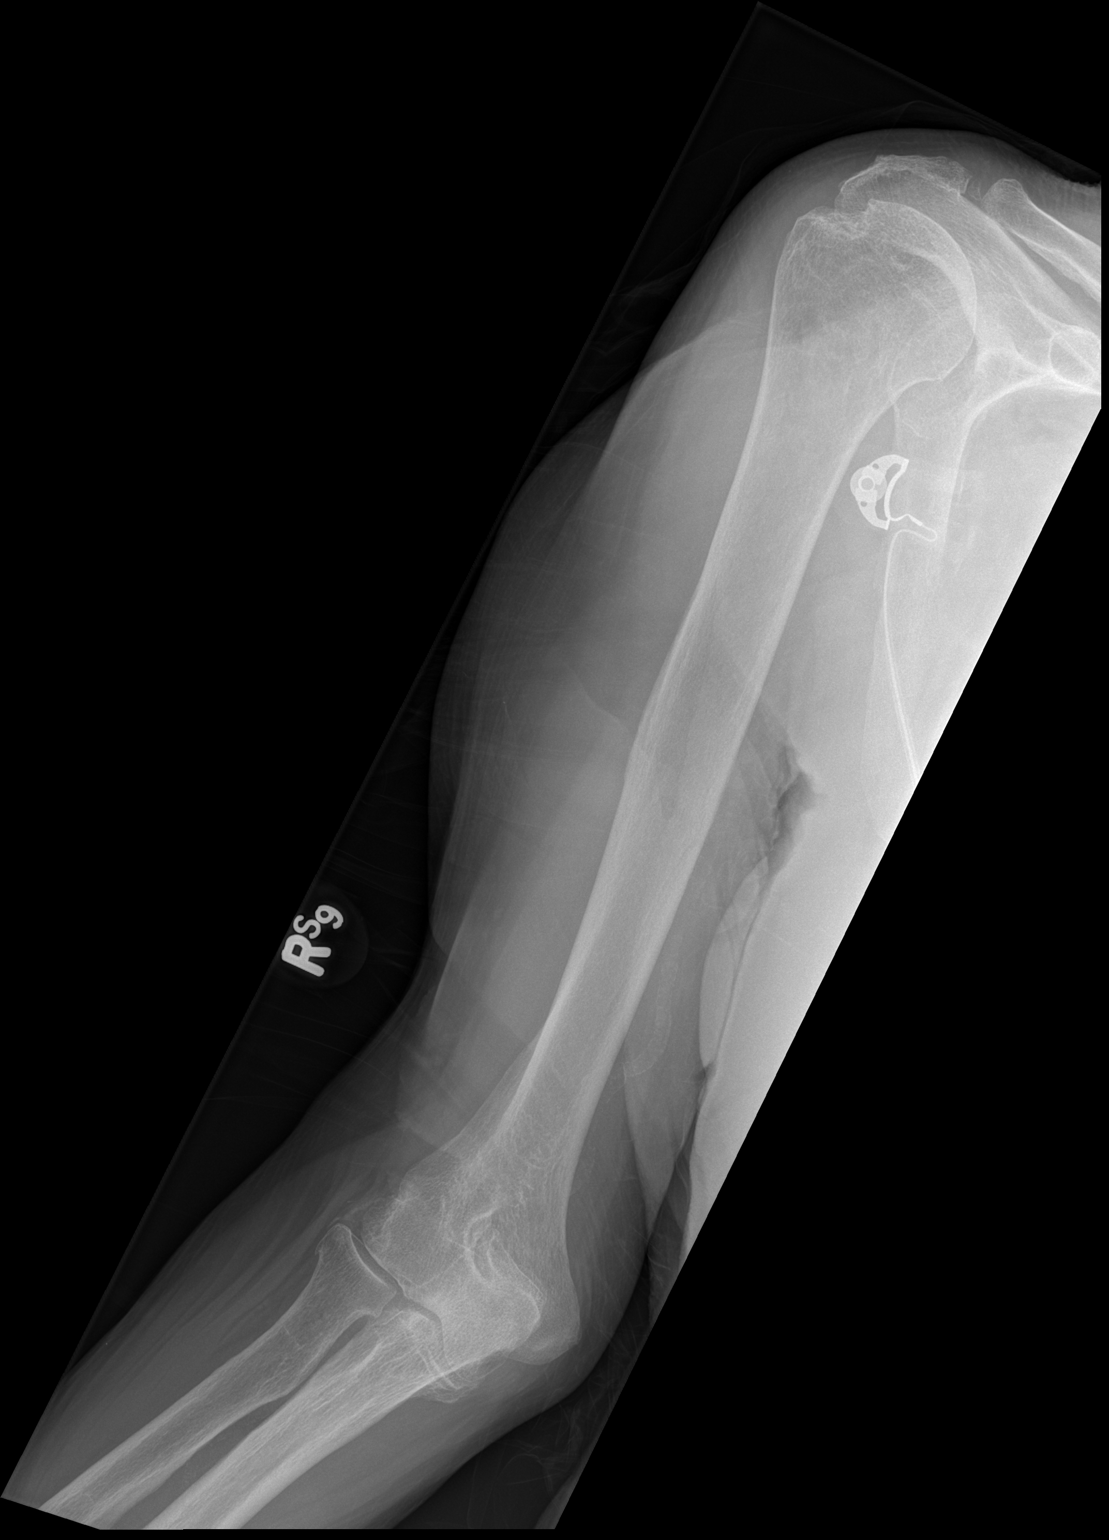

[x humerus lat right]
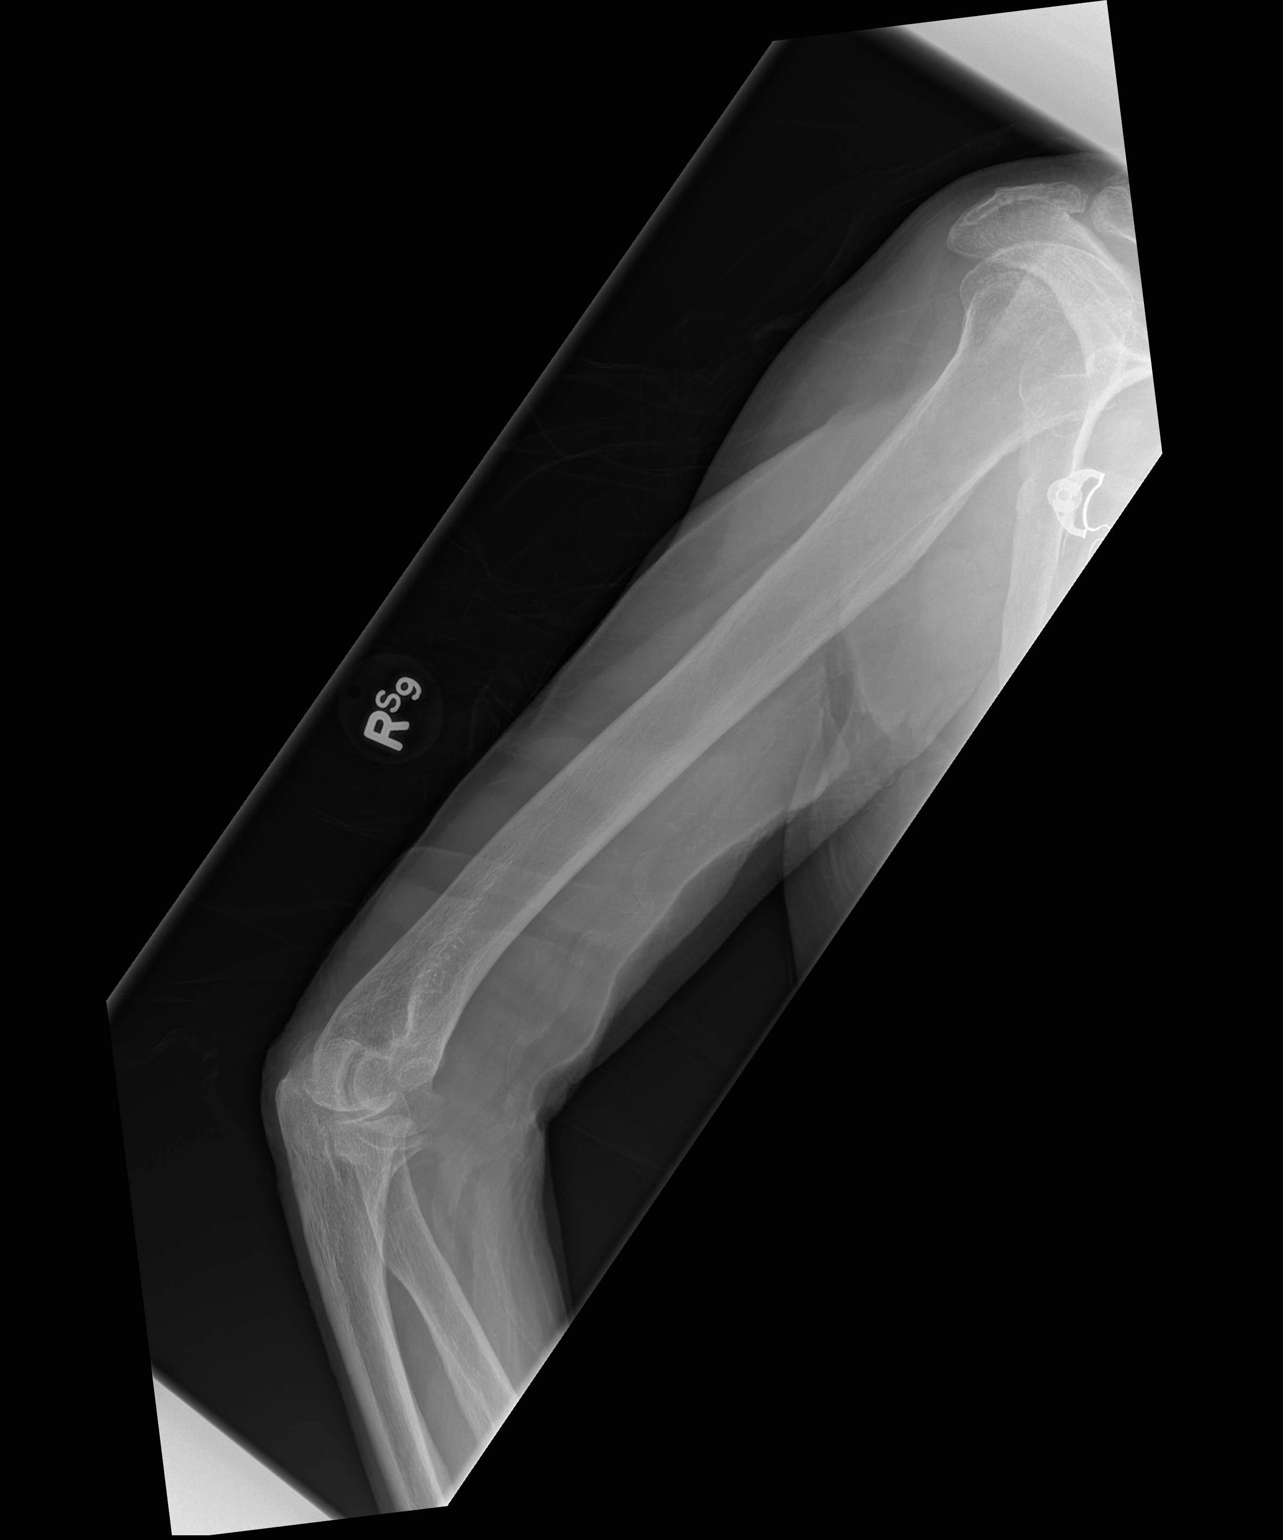

[2 of 2 positions shown; findings below may reference images not displayed]

FINDINGS: Diffuse osteopenia. No fracture or dislocation. Mild right elbow
degenerative changes.
IMPRESSION: No fracture.

## 2024-05-16 NOTE — Telephone Encounter (Signed)
 SABRA
